# Patient Record
Sex: Female | Born: 1980 | Race: White | Hispanic: No | Marital: Single | Smoking: Never smoker
Health system: Southern US, Community
[De-identification: ages and names within clinical notes are randomized; demographics above are authoritative.]

## PROBLEM LIST (undated history)

## (undated) ENCOUNTER — Inpatient Hospital Stay (HOSPITAL_COMMUNITY): Payer: Self-pay

## (undated) DIAGNOSIS — F329 Major depressive disorder, single episode, unspecified: Secondary | ICD-10-CM

## (undated) DIAGNOSIS — Z789 Other specified health status: Secondary | ICD-10-CM

## (undated) DIAGNOSIS — D696 Thrombocytopenia, unspecified: Secondary | ICD-10-CM

## (undated) DIAGNOSIS — R161 Splenomegaly, not elsewhere classified: Principal | ICD-10-CM

## (undated) DIAGNOSIS — O0990 Supervision of high risk pregnancy, unspecified, unspecified trimester: Secondary | ICD-10-CM

## (undated) DIAGNOSIS — Z3490 Encounter for supervision of normal pregnancy, unspecified, unspecified trimester: Secondary | ICD-10-CM

## (undated) DIAGNOSIS — F32A Depression, unspecified: Secondary | ICD-10-CM

## (undated) HISTORY — DX: Supervision of high risk pregnancy, unspecified, unspecified trimester: O09.90

## (undated) HISTORY — PX: WISDOM TOOTH EXTRACTION: SHX21

## (undated) HISTORY — PX: NO PAST SURGERIES: SHX2092

## (undated) HISTORY — DX: Splenomegaly, not elsewhere classified: R16.1

## (undated) NOTE — *Deleted (*Deleted)
[facility] [phone]   [phone]  PROGRESS NOTE  Patient Care Team: [name], MD as PCP - General (Internal Medicine) Jaci Standard, MD as Consulting Physician (Hematology and Oncology)  Hematological/Oncological History # Chronic Idiopathic Thrombocytopenic Purpura 1) 03/02/2013: delivered healthy child. Plt nadir of 54 2) 03/12/2013: Plt rebound to 165 without intervention.  3) 06/12/2013: last visit with Dr. Cyndie Chime. Platelets normalized to 169,000 4) 05/16/2019: WBC 5.9, Hgb 13.3, Plt 128. MCV 93.8 07/03/2019: Establish care with Dr. Leonides Schanz. WBC 6.5, Hgb 12.1, MCV 95.7, Plt 128. 5) 10/07/2019: WBC 6.6, Hgb 12.6, MCV 95.9, Plt 120 6) 10/23/2019: WBC 5.6, Hgb 10.5, Plt 86, MCV 95.8  Interval History:  Shelly Andrews 57 y.o. female with medical history significant for chronic ITP who presents for a follow up visit. The patient's last visit was on 10/23/2019 at which time she established care. In the interim since the last visit ***  On exam today Shelly Andrews ***  MEDICAL HISTORY:  Past Medical History:  Diagnosis Date  . Depression   . Splenomegaly 04/16/2013  . Thrombocytopenia (HCC)    Idopathic with pregnancy    SURGICAL HISTORY: Past Surgical History:  Procedure Laterality Date  . IUD REMOVAL  10/30/2018   Procedure: Intrauterine Device (Iud) Removal;  Surgeon: [name], MD;  Location: [facility] OR;  Service: Gynecology;;  . LAPAROSCOPIC UNILATERAL SALPINGO OOPHERECTOMY Right 10/30/2018   Procedure: LAPAROSCOPIC RIGHT SALPINGECTOMY AND REMOVAL OF ECTOPIC PREGNANCY;  Surgeon: [name], MD;  Location: [facility] OR;  Service: Gynecology;  Laterality: Right;  . WISDOM TOOTH EXTRACTION      SOCIAL HISTORY: Social History   Socioeconomic History  . Marital status: Single    Spouse name: Not on file  . Number of children: 4  . Years of education: Not on file  . Highest education level: Not on file  Occupational History  .  Not on file  Tobacco Use  . Smoking status: Never Smoker  . Smokeless tobacco: Never Used  Vaping Use  . Vaping Use: Never used  Substance and Sexual Activity  . Alcohol use: Not Currently    Comment: 1-2 on weekend  . Drug use: No  . Sexual activity: Yes    Birth control/protection: None  Other Topics Concern  . Not on file  Social History Narrative   6 pregnancies with loss of 2 (one female fetus loss in 2013 at 31 weeks and one loss in May 2020)   Social Determinants of Health   Financial Resource Strain:   . Difficulty of Paying Living Expenses: Not on file  Food Insecurity: No Food Insecurity  . Worried About Programme researcher, broadcasting/film/video in the Last Year: Never true  . Ran Out of Food in the Last Year: Never true  Transportation Needs: No Transportation Needs  . Lack of Transportation (Medical): No  . Lack of Transportation (Non-Medical): No  Physical Activity:   . Days of Exercise per Week: Not on file  . Minutes of Exercise per Session: Not on file  Stress: No Stress Concern Present  . Feeling of Stress : Not at all  Social Connections:   . Frequency of Communication with Friends and Family: Not on file  . Frequency of Social Gatherings with Friends and Family: Not on file  . Attends Religious Services: Not on file  . Active Member of Clubs or Organizations: Not on file  . Attends Banker Meetings: Not on file  . Marital Status:  Not on file  Intimate Partner Violence:   . Fear of Current or Ex-Partner: Not on file  . Emotionally Abused: Not on file  . Physically Abused: Not on file  . Sexually Abused: Not on file    FAMILY HISTORY: Family History  Problem Relation Age of Onset  . Diabetes Mother   . Diabetes Maternal Grandmother     ALLERGIES:  is allergic to zoloft [sertraline hcl] and sulfa antibiotics.  MEDICATIONS:  Current Outpatient Medications  Medication Sig Dispense Refill  . acetaminophen (TYLENOL) 500 MG tablet Take 1,000 mg by mouth every  6 (six) hours as needed for mild pain or headache.     . Docosahexaenoic Acid (PRENATAL DHA) 200 MG CAPS Take 1 capsule by mouth daily.     Marland Kitchen ibuprofen (ADVIL) 600 MG tablet Take 1 tablet (600 mg total) by mouth every 6 (six) hours. 30 tablet 0  . pantoprazole (PROTONIX) 40 MG tablet Take 1 tablet (40 mg total) by mouth daily. 30 tablet 0  . triamcinolone cream (KENALOG) 0.1 % Apply 1 application topically 2 (two) times daily. 30 g 0   No current facility-administered medications for this visit.    REVIEW OF SYSTEMS:   Constitutional: ( - ) fevers, ( - )  chills , ( - ) night sweats Eyes: ( - ) blurriness of vision, ( - ) double vision, ( - ) watery eyes Ears, nose, mouth, throat, and face: ( - ) mucositis, ( - ) sore throat Respiratory: ( - ) cough, ( - ) dyspnea, ( - ) wheezes Cardiovascular: ( - ) palpitation, ( - ) chest discomfort, ( - ) lower extremity swelling Gastrointestinal:  ( - ) nausea, ( - ) heartburn, ( - ) change in bowel habits Skin: ( - ) abnormal skin rashes Lymphatics: ( - ) new lymphadenopathy, ( - ) easy bruising Neurological: ( - ) numbness, ( - ) tingling, ( - ) new weaknesses Behavioral/Psych: ( - ) mood change, ( - ) new changes  All other systems were reviewed with the patient and are negative.  PHYSICAL EXAMINATION: There were no vitals filed for this visit. There were no vitals filed for this visit.  GENERAL: well appearing middle aged Caucasian female in NAD  SKIN: skin color, texture, turgor are normal, no rashes or significant lesions EYES: conjunctiva are pink and non-injected, sclera clear LUNGS: clear to auscultation and percussion with normal breathing effort HEART: regular rate & rhythm and no murmurs and no lower extremity edema ABDOMEN: gravid uterus Musculoskeletal: no cyanosis of digits and no clubbing  PSYCH: alert & oriented x 3, fluent speech NEURO: no focal motor/sensory deficits  LABORATORY DATA:  I have reviewed the data as listed  CBC Latest Ref Rng & Units 12/21/2019 12/20/2019 12/17/2019  WBC 4.0 - 10.5 K/uL 9.4 8.1 7.0  Hemoglobin 12.0 - 15.0 g/dL 8.4(O) 96.2 11.2(L)  Hematocrit 36 - 46 % 29.9(L) 37.8 34.1(L)  Platelets 150 - 400 K/uL 84(L) 89(L) 80(L)    CMP Latest Ref Rng & Units 10/23/2019 10/07/2019 07/03/2019  Glucose 70 - 99 mg/dL 952(W) 78 79  BUN 6 - 20 mg/dL 5(L) 6 10  Creatinine 4.13 - 1.00 mg/dL 2.44 0.10 2.72  Sodium 135 - 145 mmol/L 139 136 140  Potassium 3.5 - 5.1 mmol/L 3.2(L) 3.6 3.9  Chloride 98 - 111 mmol/L 108 104 107  CO2 22 - 32 mmol/L 21(L) 20(L) 25  Calcium 8.9 - 10.3 mg/dL 8.1(L) 8.7(L) 8.6(L)  Total Protein  6.5 - 8.1 g/dL 0.9(W) 6.0(L) 6.6  Total Bilirubin 0.3 - 1.2 mg/dL 0.5 1.2 0.3  Alkaline Phos 38 - 126 U/L 90 86 52  AST 15 - 41 U/L 16 20 14(L)  ALT 0 - 44 U/L 12 18 15    RADIOGRAPHIC STUDIES: No results found.  ASSESSMENT & PLAN Shelly Andrews 29 y.o. female with medical history significant for chronic ITP who presents for a follow up visit.   After review of the prior records, discussion with the patient, and review the labs the patient's findings are most consistent with a chronic idiopathic thrombocytopenia that developed only during times of pregnancy.  She last had a successful pregnancy in 2014 at which time she was being followed by Dr. Cyndie Chime.  At that time with no intervention her platelet counts dropped to 54,000 and she had a uneventful delivery with a rapid rebound just a few weeks later to 165,000.  Of note the patient did have a poor experience with prednisone therapy before and believes that the late term demise of her pregnancy in 2013 was secondary to steroid use.  Therefore she has requested that we avoid steroid therapy if at all possible.  I previously discussed with her that we would preferentially avoid steroid therapy until absolutely necessary or just before the delivery.  She was open to the idea of IVIG therapy.  She notes that she has not had an  epidural before and she does not wish to have one.  Therefore platelet target goal would be 50 K just before delivery.  Our treatment threshold would be 30 K at any point in her pregnancy.  On exam today Shelly Andrews is having some hypotension as well as drop in her platelet count.  Her hemoglobin has also dropped down to 10.5.  Fortunately her platelet count remains over 50,000 and therefore is still levels that would be safe for delivery.  At this time there is no intervention required for her platelet count.  I will continue every monthly lab checks until her planned delivery date of 11/04/2019.  Plan to see her approximately 3 months after delivery which would be in November 2021.  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 During her last pregnancy in 2014 she was followed by Dr. Cyndie Chime. Counts dropped to 54k with rebound to 165k after delivery without intervention. --patient is currently [redacted] weeks pregnant  --today will order CBC, CMP, and peripheral blood film --extensive prior workup performed for thrombocytopenia here at the [facility]. Bone marrow biopsy not performed due to the clear inciting factor for the findings --we will continue to measure her platelet counts through the duration of her pregnancy. No intervention required for Plt <30k. Prior to deliver the goal for the patient's platelets should be >50k. If epidural is to be performed the goal should be increased to >70k. The patient notes she has no required an epidural before and does not intend to have one at this time. --platelet checks q monthly for the next 2 months with f/u visit to be scheduled 3 months after the delivery.  --RTC in 5 months time for continued evaluation with interval q 1 month lab checks prior to delivery.    #Splenomegaly, chronic --noted to be 15 cm on CT scan in 2014 --imaging showed enlarged spleen at 13  cm on 01/19/2012 and 12.5 cm on 10/11/2012. --not known to have liver disease --continue to monitor. No indication  for splenectomy at this time, though with worsening ITP it could be considered.   #Hypotension --patient notes that she felt lightheaded, previously received IV fluids during this pregnancy --received 1000 ml of NS over 2 hours in clinic with some 20 meq of IV potassium added --orthostatic BP improved at time of release from clinic --continued f/u with Ob/Gyn.    No orders of the defined types were placed in this encounter.   All questions were answered. The patient knows to call the clinic with any problems, questions or concerns.  A total of more than 30 minutes were spent on this encounter and over half of that time was spent on counseling and coordination of care as outlined above.   Ulysees Barns, MD Department of Hematology/Oncology [facility] Phone: [phone] Pager: [phone] Email: [email or web address]  03/22/2020 6:07 PM

---
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
## 2003-07-21 ENCOUNTER — Other Ambulatory Visit: Admission: RE | Admit: 2003-07-21 | Discharge: 2003-07-21 | Payer: Self-pay | Admitting: Obstetrics and Gynecology

## 2004-06-09 ENCOUNTER — Other Ambulatory Visit: Admission: RE | Admit: 2004-06-09 | Discharge: 2004-06-09 | Payer: Self-pay | Admitting: Obstetrics and Gynecology

## 2004-11-23 ENCOUNTER — Emergency Department (HOSPITAL_COMMUNITY): Admission: EM | Admit: 2004-11-23 | Discharge: 2004-11-23 | Payer: Self-pay | Admitting: Emergency Medicine

## 2005-08-02 ENCOUNTER — Inpatient Hospital Stay (HOSPITAL_COMMUNITY): Admission: AD | Admit: 2005-08-02 | Discharge: 2005-08-03 | Payer: Self-pay | Admitting: Obstetrics and Gynecology

## 2006-02-13 ENCOUNTER — Emergency Department (HOSPITAL_COMMUNITY): Admission: EM | Admit: 2006-02-13 | Discharge: 2006-02-13 | Payer: Self-pay | Admitting: Family Medicine

## 2008-04-10 ENCOUNTER — Emergency Department (HOSPITAL_COMMUNITY): Admission: EM | Admit: 2008-04-10 | Discharge: 2008-04-10 | Payer: Self-pay | Admitting: Family Medicine

## 2008-10-19 ENCOUNTER — Inpatient Hospital Stay (HOSPITAL_COMMUNITY): Admission: AD | Admit: 2008-10-19 | Discharge: 2008-10-19 | Payer: Self-pay | Admitting: Obstetrics & Gynecology

## 2008-10-30 ENCOUNTER — Inpatient Hospital Stay (HOSPITAL_COMMUNITY): Admission: AD | Admit: 2008-10-30 | Discharge: 2008-11-02 | Payer: Self-pay | Admitting: Obstetrics & Gynecology

## 2009-09-15 ENCOUNTER — Ambulatory Visit: Payer: Self-pay | Admitting: Hematology and Oncology

## 2009-09-17 LAB — COMPREHENSIVE METABOLIC PANEL
CO2: 21 mEq/L (ref 19–32)
Creatinine, Ser: 0.61 mg/dL (ref 0.40–1.20)
Glucose, Bld: 87 mg/dL (ref 70–99)
Total Bilirubin: 0.3 mg/dL (ref 0.3–1.2)

## 2009-09-17 LAB — CBC WITH DIFFERENTIAL/PLATELET
EOS%: 1.6 % (ref 0.0–7.0)
Eosinophils Absolute: 0.1 10*3/uL (ref 0.0–0.5)
MCV: 94 fL (ref 79.5–101.0)
MONO%: 5.3 % (ref 0.0–14.0)
NEUT#: 5 10*3/uL (ref 1.5–6.5)
RBC: 3.76 10*6/uL (ref 3.70–5.45)
RDW: 13.2 % (ref 11.2–14.5)
WBC: 6.8 10*3/uL (ref 3.9–10.3)
lymph#: 1.3 10*3/uL (ref 0.9–3.3)

## 2009-09-17 LAB — MORPHOLOGY

## 2009-09-24 LAB — CBC WITH DIFFERENTIAL/PLATELET
BASO%: 0.1 % (ref 0.0–2.0)
EOS%: 0.3 % (ref 0.0–7.0)
HCT: 37 % (ref 34.8–46.6)
HGB: 12.2 g/dL (ref 11.6–15.9)
LYMPH%: 11 % — ABNORMAL LOW (ref 14.0–49.7)
MCH: 31.3 pg (ref 25.1–34.0)
MCHC: 33 g/dL (ref 31.5–36.0)
NEUT#: 7.9 10*3/uL — ABNORMAL HIGH (ref 1.5–6.5)
NEUT%: 84.7 % — ABNORMAL HIGH (ref 38.4–76.8)
RBC: 3.9 10*6/uL (ref 3.70–5.45)
lymph#: 1 10*3/uL (ref 0.9–3.3)
nRBC: 0 % (ref 0–0)

## 2009-09-28 ENCOUNTER — Encounter: Admission: RE | Admit: 2009-09-28 | Discharge: 2009-09-28 | Payer: Self-pay | Admitting: Obstetrics and Gynecology

## 2009-09-30 LAB — CBC WITH DIFFERENTIAL/PLATELET
BASO%: 0.2 % (ref 0.0–2.0)
EOS%: 0.1 % (ref 0.0–7.0)
LYMPH%: 12.6 % — ABNORMAL LOW (ref 14.0–49.7)
MCHC: 34.3 g/dL (ref 31.5–36.0)
MCV: 94.1 fL (ref 79.5–101.0)
MONO%: 4.4 % (ref 0.0–14.0)
Platelets: 89 10*3/uL — ABNORMAL LOW (ref 145–400)
RBC: 3.89 10*6/uL (ref 3.70–5.45)
RDW: 13.1 % (ref 11.2–14.5)

## 2009-10-07 LAB — CBC WITH DIFFERENTIAL/PLATELET
BASO%: 0.1 % (ref 0.0–2.0)
Eosinophils Absolute: 0 10*3/uL (ref 0.0–0.5)
HCT: 40.2 % (ref 34.8–46.6)
HGB: 13.8 g/dL (ref 11.6–15.9)
LYMPH%: 8.7 % — ABNORMAL LOW (ref 14.0–49.7)
MCHC: 34.3 g/dL (ref 31.5–36.0)
MONO#: 0.5 10*3/uL (ref 0.1–0.9)
NEUT#: 13.2 10*3/uL — ABNORMAL HIGH (ref 1.5–6.5)
NEUT%: 87.9 % — ABNORMAL HIGH (ref 38.4–76.8)
Platelets: 84 10*3/uL — ABNORMAL LOW (ref 145–400)
WBC: 15 10*3/uL — ABNORMAL HIGH (ref 3.9–10.3)
lymph#: 1.3 10*3/uL (ref 0.9–3.3)

## 2009-10-14 LAB — CBC WITH DIFFERENTIAL/PLATELET
Basophils Absolute: 0 10*3/uL (ref 0.0–0.1)
HCT: 39.6 % (ref 34.8–46.6)
HGB: 13.6 g/dL (ref 11.6–15.9)
LYMPH%: 20.8 % (ref 14.0–49.7)
MCH: 32.3 pg (ref 25.1–34.0)
MCHC: 34.5 g/dL (ref 31.5–36.0)
MONO#: 0.6 10*3/uL (ref 0.1–0.9)
NEUT%: 73.6 % (ref 38.4–76.8)
Platelets: 65 10*3/uL — ABNORMAL LOW (ref 145–400)
WBC: 10.9 10*3/uL — ABNORMAL HIGH (ref 3.9–10.3)
lymph#: 2.3 10*3/uL (ref 0.9–3.3)

## 2009-10-15 ENCOUNTER — Ambulatory Visit: Payer: Self-pay | Admitting: Hematology and Oncology

## 2009-10-19 ENCOUNTER — Ambulatory Visit (HOSPITAL_COMMUNITY): Admission: AD | Admit: 2009-10-19 | Discharge: 2009-10-19 | Payer: Self-pay | Admitting: Obstetrics and Gynecology

## 2009-10-20 ENCOUNTER — Ambulatory Visit (HOSPITAL_COMMUNITY): Admission: AD | Admit: 2009-10-20 | Discharge: 2009-10-20 | Payer: Self-pay | Admitting: Obstetrics and Gynecology

## 2009-10-28 LAB — CBC WITH DIFFERENTIAL/PLATELET
BASO%: 0 % (ref 0.0–2.0)
Basophils Absolute: 0 10*3/uL (ref 0.0–0.1)
EOS%: 0 % (ref 0.0–7.0)
HCT: 41.6 % (ref 34.8–46.6)
HGB: 14.4 g/dL (ref 11.6–15.9)
LYMPH%: 9.9 % — ABNORMAL LOW (ref 14.0–49.7)
MCH: 32.5 pg (ref 25.1–34.0)
MCHC: 34.5 g/dL (ref 31.5–36.0)
MCV: 94 fL (ref 79.5–101.0)
MONO%: 2.5 % (ref 0.0–14.0)
NEUT%: 87.6 % — ABNORMAL HIGH (ref 38.4–76.8)

## 2009-11-03 LAB — CBC WITH DIFFERENTIAL/PLATELET
BASO%: 0.2 % (ref 0.0–2.0)
Basophils Absolute: 0 10*3/uL (ref 0.0–0.1)
EOS%: 0 % (ref 0.0–7.0)
MCH: 32 pg (ref 25.1–34.0)
MCHC: 34.1 g/dL (ref 31.5–36.0)
MCV: 93.7 fL (ref 79.5–101.0)
MONO%: 3.9 % (ref 0.0–14.0)
RBC: 4.53 10*6/uL (ref 3.70–5.45)
RDW: 14.4 % (ref 11.2–14.5)

## 2009-11-08 ENCOUNTER — Inpatient Hospital Stay (HOSPITAL_COMMUNITY): Admission: AD | Admit: 2009-11-08 | Discharge: 2009-11-10 | Payer: Self-pay | Admitting: Obstetrics and Gynecology

## 2009-11-09 ENCOUNTER — Ambulatory Visit: Payer: Self-pay | Admitting: Hematology and Oncology

## 2009-11-15 ENCOUNTER — Ambulatory Visit: Payer: Self-pay | Admitting: Hematology and Oncology

## 2009-11-17 LAB — CBC WITH DIFFERENTIAL/PLATELET
BASO%: 0.2 % (ref 0.0–2.0)
EOS%: 0.2 % (ref 0.0–7.0)
Eosinophils Absolute: 0 10*3/uL (ref 0.0–0.5)
LYMPH%: 15.2 % (ref 14.0–49.7)
MCH: 32.1 pg (ref 25.1–34.0)
MCHC: 33.9 g/dL (ref 31.5–36.0)
MCV: 94.6 fL (ref 79.5–101.0)
MONO%: 6.2 % (ref 0.0–14.0)
NEUT#: 6.3 10*3/uL (ref 1.5–6.5)
Platelets: 119 10*3/uL — ABNORMAL LOW (ref 145–400)
RBC: 4.67 10*6/uL (ref 3.70–5.45)
RDW: 14.9 % — ABNORMAL HIGH (ref 11.2–14.5)

## 2009-11-17 LAB — BASIC METABOLIC PANEL
Calcium: 9.1 mg/dL (ref 8.4–10.5)
Glucose, Bld: 96 mg/dL (ref 70–99)
Potassium: 4 mEq/L (ref 3.5–5.3)
Sodium: 141 mEq/L (ref 135–145)

## 2009-12-14 LAB — BASIC METABOLIC PANEL
BUN: 9 mg/dL (ref 6–23)
Chloride: 104 mEq/L (ref 96–112)
Creatinine, Ser: 0.84 mg/dL (ref 0.40–1.20)
Glucose, Bld: 72 mg/dL (ref 70–99)
Potassium: 4 mEq/L (ref 3.5–5.3)

## 2009-12-14 LAB — CBC WITH DIFFERENTIAL/PLATELET
Basophils Absolute: 0 10*3/uL (ref 0.0–0.1)
EOS%: 0.5 % (ref 0.0–7.0)
Eosinophils Absolute: 0 10*3/uL (ref 0.0–0.5)
HCT: 42.3 % (ref 34.8–46.6)
HGB: 14.3 g/dL (ref 11.6–15.9)
MCH: 32 pg (ref 25.1–34.0)
MCV: 94.5 fL (ref 79.5–101.0)
NEUT#: 7.2 10*3/uL — ABNORMAL HIGH (ref 1.5–6.5)
NEUT%: 80.1 % — ABNORMAL HIGH (ref 38.4–76.8)
lymph#: 1.4 10*3/uL (ref 0.9–3.3)

## 2010-02-09 ENCOUNTER — Encounter: Admission: RE | Admit: 2010-02-09 | Discharge: 2010-03-08 | Payer: Self-pay | Admitting: Podiatry

## 2010-07-24 LAB — GLUCOSE, CAPILLARY
Glucose-Capillary: 107 mg/dL — ABNORMAL HIGH (ref 70–99)
Glucose-Capillary: 143 mg/dL — ABNORMAL HIGH (ref 70–99)
Glucose-Capillary: 204 mg/dL — ABNORMAL HIGH (ref 70–99)

## 2010-07-24 LAB — CBC
MCH: 32.9 pg (ref 26.0–34.0)
MCH: 33.1 pg (ref 26.0–34.0)
MCHC: 34.6 g/dL (ref 30.0–36.0)
MCV: 95 fL (ref 78.0–100.0)
MCV: 96 fL (ref 78.0–100.0)
Platelets: 69 10*3/uL — ABNORMAL LOW (ref 150–400)
Platelets: 69 10*3/uL — ABNORMAL LOW (ref 150–400)
Platelets: 87 10*3/uL — ABNORMAL LOW (ref 150–400)
RBC: 4.1 MIL/uL (ref 3.87–5.11)
RDW: 14.3 % (ref 11.5–15.5)
RDW: 14.3 % (ref 11.5–15.5)
WBC: 13.1 10*3/uL — ABNORMAL HIGH (ref 4.0–10.5)
WBC: 17.6 10*3/uL — ABNORMAL HIGH (ref 4.0–10.5)

## 2010-07-24 LAB — SAMPLE TO BLOOD BANK

## 2010-07-24 LAB — RH IMMUNE GLOB WKUP(>/=20WKS)(NOT WOMEN'S HOSP)

## 2010-07-24 LAB — RPR: RPR Ser Ql: NONREACTIVE

## 2010-08-15 LAB — COMPREHENSIVE METABOLIC PANEL
AST: 24 U/L (ref 0–37)
Albumin: 2.4 g/dL — ABNORMAL LOW (ref 3.5–5.2)
Alkaline Phosphatase: 193 U/L — ABNORMAL HIGH (ref 39–117)
Chloride: 109 mEq/L (ref 96–112)
Creatinine, Ser: 0.71 mg/dL (ref 0.4–1.2)
GFR calc Af Amer: >60 mL/min (ref >60)
Potassium: 3.5 mEq/L (ref 3.5–5.1)
Total Bilirubin: 1 mg/dL (ref 0.3–1.2)

## 2010-08-15 LAB — CBC
HCT: 34.8 % — ABNORMAL LOW (ref 36.0–46.0)
MCHC: 33.4 g/dL (ref 30.0–36.0)
MCHC: 34.2 g/dL (ref 30.0–36.0)
MCHC: 34.3 g/dL (ref 30.0–36.0)
MCHC: 34.7 g/dL (ref 30.0–36.0)
MCV: 93.3 fL (ref 78.0–100.0)
MCV: 94.6 fL (ref 78.0–100.0)
MCV: 95.2 fL (ref 78.0–100.0)
Platelets: 42 10*3/uL — CL (ref 150–400)
Platelets: 45 10*3/uL — CL (ref 150–400)
Platelets: 54 10*3/uL — ABNORMAL LOW (ref 150–400)
Platelets: 59 10*3/uL — ABNORMAL LOW (ref 150–400)
Platelets: 61 10*3/uL — ABNORMAL LOW (ref 150–400)
RBC: 3.68 MIL/uL — ABNORMAL LOW (ref 3.87–5.11)
RBC: 4.12 MIL/uL (ref 3.87–5.11)
RDW: 14.3 % (ref 11.5–15.5)
RDW: 14.5 % (ref 11.5–15.5)
RDW: 14.5 % (ref 11.5–15.5)
WBC: 12.2 10*3/uL — ABNORMAL HIGH (ref 4.0–10.5)
WBC: 9 10*3/uL (ref 4.0–10.5)

## 2010-08-15 LAB — RH IMMUNE GLOB WKUP(>/=20WKS)(NOT WOMEN'S HOSP): Fetal Screen: NEGATIVE

## 2010-08-15 LAB — DIFFERENTIAL
Basophils Absolute: 0 10*3/uL (ref 0.0–0.1)
Lymphocytes Relative: 20 % (ref 12–46)
Neutro Abs: 7.3 10*3/uL (ref 1.7–7.7)
Neutrophils Relative %: 73 % (ref 43–77)

## 2010-08-15 LAB — TYPE AND SCREEN
ABO/RH(D): O NEG
Antibody Screen: NEGATIVE

## 2010-08-15 LAB — URIC ACID: Uric Acid, Serum: 5.9 mg/dL (ref 2.4–7.0)

## 2010-08-15 LAB — PROTIME-INR: Prothrombin Time: 14.2 seconds (ref 11.6–15.2)

## 2010-08-15 LAB — RPR: RPR Ser Ql: NONREACTIVE

## 2011-01-28 ENCOUNTER — Inpatient Hospital Stay (INDEPENDENT_AMBULATORY_CARE_PROVIDER_SITE_OTHER)
Admission: RE | Admit: 2011-01-28 | Discharge: 2011-01-28 | Disposition: A | Discharge status: Home / Self Care | Payer: Medicaid Other | Source: Ambulatory Visit | Attending: Family Medicine | Admitting: Family Medicine

## 2011-01-28 DIAGNOSIS — N390 Urinary tract infection, site not specified: Secondary | ICD-10-CM

## 2011-01-28 LAB — POCT URINALYSIS DIP (DEVICE)
Bilirubin Urine: NEGATIVE
Glucose, UA: 100 mg/dL — AB
Nitrite: POSITIVE — AB
Specific Gravity, Urine: >=1.03 (ref 1.005–1.030)
Urobilinogen, UA: 2 mg/dL — ABNORMAL HIGH (ref 0.0–1.0)

## 2011-01-28 LAB — POCT PREGNANCY, URINE: Preg Test, Ur: NEGATIVE

## 2011-06-24 ENCOUNTER — Inpatient Hospital Stay (HOSPITAL_COMMUNITY): Payer: Medicaid Other

## 2011-06-24 ENCOUNTER — Inpatient Hospital Stay (HOSPITAL_COMMUNITY)
Admission: AD | Admit: 2011-06-24 | Discharge: 2011-06-24 | Disposition: A | Discharge status: Home / Self Care | Payer: Medicaid Other | Source: Ambulatory Visit | Attending: Obstetrics & Gynecology | Admitting: Obstetrics & Gynecology

## 2011-06-24 ENCOUNTER — Encounter (HOSPITAL_COMMUNITY): Payer: Self-pay | Admitting: *Deleted

## 2011-06-24 DIAGNOSIS — R12 Heartburn: Secondary | ICD-10-CM | POA: Insufficient documentation

## 2011-06-24 DIAGNOSIS — O2390 Unspecified genitourinary tract infection in pregnancy, unspecified trimester: Secondary | ICD-10-CM | POA: Insufficient documentation

## 2011-06-24 DIAGNOSIS — Z3201 Encounter for pregnancy test, result positive: Secondary | ICD-10-CM

## 2011-06-24 DIAGNOSIS — R109 Unspecified abdominal pain: Secondary | ICD-10-CM | POA: Insufficient documentation

## 2011-06-24 DIAGNOSIS — N760 Acute vaginitis: Secondary | ICD-10-CM | POA: Insufficient documentation

## 2011-06-24 DIAGNOSIS — B9689 Other specified bacterial agents as the cause of diseases classified elsewhere: Secondary | ICD-10-CM | POA: Insufficient documentation

## 2011-06-24 DIAGNOSIS — A499 Bacterial infection, unspecified: Secondary | ICD-10-CM | POA: Insufficient documentation

## 2011-06-24 DIAGNOSIS — O26899 Other specified pregnancy related conditions, unspecified trimester: Secondary | ICD-10-CM

## 2011-06-24 HISTORY — DX: Thrombocytopenia, unspecified: D69.6

## 2011-06-24 LAB — URINALYSIS, ROUTINE W REFLEX MICROSCOPIC
Ketones, ur: NEGATIVE mg/dL
Leukocytes, UA: NEGATIVE
Nitrite: NEGATIVE
Protein, ur: NEGATIVE mg/dL

## 2011-06-24 LAB — CBC
HCT: 38.5 % (ref 36.0–46.0)
Hemoglobin: 12.7 g/dL (ref 12.0–15.0)
RBC: 4.2 MIL/uL (ref 3.87–5.11)
RDW: 13.2 % (ref 11.5–15.5)
WBC: 5.5 10*3/uL (ref 4.0–10.5)

## 2011-06-24 LAB — WET PREP, GENITAL

## 2011-06-24 MED ORDER — METRONIDAZOLE 500 MG PO TABS: 500.0000 mg | ORAL_TABLET | Freq: Two times a day (BID) | ORAL | Status: AC

## 2011-06-24 MED ORDER — RANITIDINE HCL 150 MG PO TABS: 150.0000 mg | ORAL_TABLET | Freq: Two times a day (BID) | ORAL | Status: DC

## 2011-06-24 NOTE — Discharge Instructions (Signed)
No smoking, no drugs, no alcohol.  Take a prenatal vitamin one by mouth every day.  Eat small frequent snacks to avoid nausea.  Begin prenatal care as soon as possible. Return if you develop more severe abdominal pain or vaginal bleeding. Drink at least 8 8-oz glasses of water every day. Take Tylenol 325 mg 2 tablets by mouth every 4 hours if needed for pain.

## 2011-06-24 NOTE — Progress Notes (Signed)
Pt reports having abd pain on and off for a few days c/o heart burn. Had positive HPT

## 2011-06-24 NOTE — ED Provider Notes (Signed)
History     Chief Complaint  Patient presents with  . Abdominal Pain   HPI Shelly Andrews 31 y.o. 4w 5d gestation by LMP 05-27-11.  Is having lower abdominal cramping and heartburn.  Suspected she was pregnant but is very tearful with positive pregnancy result.    OB History    Grav Para Term Preterm Abortions TAB SAB Ect Mult Living   3 2 1 1  0 0 0 0 0 2      Past Medical History  Diagnosis Date  . Thrombocytopenia     Idopathic with pregnancy    Past Surgical History  Procedure Date  . No past surgeries     No family history on file.  History  Substance Use Topics  . Smoking status: Never Smoker   . Smokeless tobacco: Not on file  . Alcohol Use: Yes     1-2 on weekend    Allergies:  Allergies  Allergen Reactions  . Sulfa Antibiotics Hives    Prescriptions prior to admission  Medication Sig Dispense Refill  . acetaminophen (TYLENOL) 325 MG tablet Take 650 mg by mouth every 6 (six) hours as needed. For pain        Review of Systems  Constitutional: Negative for fever.  Gastrointestinal: Positive for heartburn and abdominal pain. Negative for nausea and vomiting.  Genitourinary:       No vaginal bleeding   Physical Exam   Blood pressure 107/73, pulse 102, temperature 98.9 F (37.2 C), temperature source Oral, resp. rate 18, height 5\' 3"  (1.6 m), weight 126 lb 9.6 oz (57.425 kg), last menstrual period 05/22/2011.  Physical Exam  Nursing note and vitals reviewed. Constitutional: She is oriented to person, place, and time. She appears well-developed and well-nourished.  HENT:  Head: Normocephalic.  Eyes: EOM are normal.  Neck: Neck supple.  GI: Soft. There is no tenderness.  Genitourinary:       Speculum exam: Vagina - large amount of yellow liquid discharge, no odor Cervix - cervix barely open and white substance noted in cervix.  Contact bleeding noted Bimanual exam: Cervix closed Uterus non tender, normal size Adnexa non tender, no masses  bilaterally GC/Chlam, wet prep done Chaperone present for exam.  Musculoskeletal: Normal range of motion.  Neurological: She is alert and oriented to person, place, and time.  Skin: Skin is warm and dry.  Psychiatric: She has a normal mood and affect.    MAU Course  Procedures  MDM Ultrasound - 5w 2d IUGS with probable yolk sac.  No fetal pole.  No adnexal masses.  No free fluid.  Results for orders placed during the hospital encounter of 06/24/11 (from the past 24 hour(s))  URINALYSIS, ROUTINE W REFLEX MICROSCOPIC     Status: Normal   Collection Time   06/24/11  1:40 PM      Component Value Range   Color, Urine YELLOW  YELLOW    APPearance CLEAR  CLEAR    Specific Gravity, Urine 1.020  1.005 - 1.030    pH 7.0  5.0 - 8.0    Glucose, UA NEGATIVE  NEGATIVE (mg/dL)   Hgb urine dipstick NEGATIVE  NEGATIVE    Bilirubin Urine NEGATIVE  NEGATIVE    Ketones, ur NEGATIVE  NEGATIVE (mg/dL)   Protein, ur NEGATIVE  NEGATIVE (mg/dL)   Urobilinogen, UA 0.2  0.0 - 1.0 (mg/dL)   Nitrite NEGATIVE  NEGATIVE    Leukocytes, UA NEGATIVE  NEGATIVE   POCT PREGNANCY, URINE  Status: Abnormal   Collection Time   06/24/11  1:47 PM      Component Value Range   Preg Test, Ur POSITIVE (*) NEGATIVE   WET PREP, GENITAL     Status: Abnormal   Collection Time   06/24/11  4:58 PM      Component Value Range   Yeast Wet Prep HPF POC NONE SEEN  NONE SEEN    Trich, Wet Prep NONE SEEN  NONE SEEN    Clue Cells Wet Prep HPF POC MODERATE (*) NONE SEEN    WBC, Wet Prep HPF POC FEW (*) NONE SEEN   CBC     Status: Abnormal   Collection Time   06/24/11  5:03 PM      Component Value Range   WBC 5.5  4.0 - 10.5 (K/uL)   RBC 4.20  3.87 - 5.11 (MIL/uL)   Hemoglobin 12.7  12.0 - 15.0 (g/dL)   HCT 65.7  84.6 - 96.2 (%)   MCV 91.7  78.0 - 100.0 (fL)   MCH 30.2  26.0 - 34.0 (pg)   MCHC 33.0  30.0 - 36.0 (g/dL)   RDW 95.2  84.1 - 32.4 (%)   Platelets 116 (*) 150 - 400 (K/uL)  HCG, QUANTITATIVE, PREGNANCY      Status: Abnormal   Collection Time   06/24/11  5:03 PM      Component Value Range   hCG, Beta Chain, Quant, S 5672 (*) <5 (mIU/mL)   Assessment and Plan  Early pregnancy - IUGS with probable yolk sac Bacterial vaginosis  Plan rx metronidazole 500 mg po bid x 7 days (#14) no refills rx zantac 150 mg PO bid (#60) no refills Labs pending No smoking, no drugs, no alcohol.  Take a prenatal vitamin one by mouth every day.  Eat small frequent snacks to avoid nausea.  Begin prenatal care as soon as possible. Return if you develop more severe abdominal pain or vaginal bleeding. Drink at least 8 8-oz glasses of water every day. Take Tylenol 325 mg 2 tablets by mouth every 4 hours if needed for pain.   Channon Brougher 06/24/2011, 4:46 PM   Nolene Bernheim, NP 06/24/11 1835

## 2011-06-24 NOTE — ED Provider Notes (Signed)
Attestation of Attending Supervision of Advanced Practitioner: Evaluation and management procedures were performed by the PA/NP/CNM/OB Fellow under my supervision/collaboration. Chart reviewed, and agree with management and plan.  Jaynie Collins, M.D. 06/24/2011 7:17 PM

## 2011-07-06 LAB — OB RESULTS CONSOLE HEPATITIS B SURFACE ANTIGEN: Hepatitis B Surface Ag: NEGATIVE

## 2011-07-18 ENCOUNTER — Inpatient Hospital Stay (HOSPITAL_COMMUNITY)
Admission: AD | Admit: 2011-07-18 | Discharge: 2011-07-18 | Disposition: A | Discharge status: Home / Self Care | Payer: Medicaid Other | Source: Ambulatory Visit | Attending: Obstetrics and Gynecology | Admitting: Obstetrics and Gynecology

## 2011-07-18 ENCOUNTER — Encounter (HOSPITAL_COMMUNITY): Payer: Self-pay | Admitting: *Deleted

## 2011-07-18 DIAGNOSIS — O209 Hemorrhage in early pregnancy, unspecified: Secondary | ICD-10-CM | POA: Insufficient documentation

## 2011-07-18 HISTORY — DX: Other specified health status: Z78.9

## 2011-07-18 MED ORDER — RHO D IMMUNE GLOBULIN 1500 UNIT/2ML IJ SOLN
300.0000 ug | Freq: Once | INTRAMUSCULAR | Status: AC
  Administered 2011-07-18: 300 ug via INTRAMUSCULAR
  Filled 2011-07-18: qty 2

## 2011-07-18 NOTE — MAU Note (Signed)
Received rhophyllac  With other pregnancies without any problems. Pt dc'd.

## 2011-07-18 NOTE — MAU Note (Signed)
Pt had intercourse then got up to use restroom, noticed vaginal bleeding when she wiped

## 2011-07-18 NOTE — MAU Note (Signed)
Patient states she had bright red vaginal bleeding on the tissue after intercourse. No pain.

## 2011-07-18 NOTE — Discharge Instructions (Signed)
KEEP YOUR APPOINTMENT TOMORROW WITH DR. Renaldo Fiddler. RETURN HERE AS NEEDED.  Threatened Miscarriage  A threatened miscarriage is a pregnancy that may end. It may be marked by bleeding during the first 20 weeks of pregnancy. Often, the pregnancy can continue without any more problems. You may be asked to stop:  Having sex (intercourse).   Having orgasms.   Using tampons.   Exercising.   Doing heavy physical activity and work.  HOME CARE   Your doctor may tell you to take bed rest and to stop activities and work.   Write down the number of pads you use each day. Write down how often you change pads. Write down how soaked they are.   Follow your doctor's advice for follow-up visits and tests.   If your blood type is Rh-negative and the father's blood is Rh-positive (or is not known), you may get a shot to protect the baby.   If you have a miscarriage, save all the tissue you pass in a container. Take the container to your doctor.  GET HELP RIGHT AWAY IF:   You have bad cramps or pain in your belly (abdomen), lower belly, or back.   You have a fever or chills.   Your bleeding gets worse or you pass large clots of blood or tissue. Save this tissue to show your doctor.   You feel lightheaded, weak, dizzy, or pass out (faint).   You have a gush of fluid from your vagina.  MAKE SURE YOU:   Understand these instructions.   Will watch your condition.   Will get help right away if you are not doing well or get worse.  Document Released: 04/06/2008 Document Revised: 04/13/2011 Document Reviewed: 05/10/2009 [facility] Patient Information 2012 [place], [place].

## 2011-07-18 NOTE — MAU Provider Note (Signed)
History     CSN: [id number]  Arrival date & time 07/18/11  1420   None     Chief Complaint  Patient presents with  . Vaginal Bleeding    HPI Shelly Andrews is a 31 y.o. female @ [redacted]w[redacted]d gestation who presents to MAU for spotting after intercourse. Denies pain. Previous visit here in MAU 2/16 13 showed a 5 week 2 day IUP. Has appointment to start prenatal care with Dr. Renaldo Fiddler tomorrow.  Past Medical History  Diagnosis Date  . Thrombocytopenia     Idopathic with pregnancy  . No pertinent past medical history     Past Surgical History  Procedure Date  . No past surgeries     History reviewed. No pertinent family history.  History  Substance Use Topics  . Smoking status: Never Smoker   . Smokeless tobacco: Not on file  . Alcohol Use: No     1-2 on weekend    OB History    Grav Para Term Preterm Abortions TAB SAB Ect Mult Living   3 2 1 1  0 0 0 0 0 2      Review of Systems  Constitutional: Negative for fever, chills, diaphoresis and fatigue.  HENT: Negative for ear pain, congestion, sore throat, facial swelling, neck pain, neck stiffness, dental problem and sinus pressure.   Eyes: Negative for photophobia, pain and discharge.  Respiratory: Negative for cough, chest tightness and wheezing.   Cardiovascular: Negative.   Gastrointestinal: Positive for nausea. Negative for vomiting, abdominal pain, diarrhea, constipation and abdominal distention.  Genitourinary: Positive for frequency. Negative for dysuria, flank pain, vaginal bleeding, vaginal discharge, difficulty urinating and pelvic pain.  Musculoskeletal: Negative for myalgias, back pain and gait problem.  Skin: Negative for color change and rash.  Neurological: Positive for headaches. Negative for dizziness, speech difficulty, weakness, light-headedness and numbness.  Psychiatric/Behavioral: Negative for confusion and agitation. The patient is not nervous/anxious.    Informal bedside ultrasound shows IUP with  cardiac activity. Patient able to visualize the heart beat.  Allergies  Sulfa antibiotics  Home Medications  No current outpatient prescriptions on file.  BP 114/72  Pulse 93  Temp(Src) 99.3 F (37.4 C) (Oral)  Resp 16  Ht 5\' 3"  (1.6 m)  Wt 127 lb 6.4 oz (57.788 kg)  BMI 22.57 kg/m2  SpO2 100%  LMP 05/22/2011  Physical Exam  Nursing note and vitals reviewed. Constitutional: She is oriented to person, place, and time. She appears well-developed and well-nourished. No distress.  HENT:  Head: Normocephalic.  Eyes: EOM are normal.  Neck: Neck supple.  Cardiovascular: Normal rate.   Pulmonary/Chest: Effort normal.  Abdominal: Soft. There is no tenderness.  Genitourinary:       Small amount of blood vaginal vault. Cervix closed, no CMT, no adnexal tenderness. Uterus approximately 8 week size.  Musculoskeletal: Normal range of motion.  Neurological: She is alert and oriented to person, place, and time. No cranial nerve deficit.  Skin: Skin is warm and dry.  Psychiatric: Her behavior is normal. Judgment and thought content normal. Her mood appears anxious.       Tearful     ED Course: Dr. Marcelle Overlie paged @ 15:30 pm to discuss patient findings. Pt. To keep appointment in office tomorrow. Will give Rhogam today.  Procedures   Assessment: Bleeding in early pregnancy  Plan:  Rhogam   Follow up in the office tomorrow as scheduled   Return here as needed. MDM

## 2011-07-19 LAB — RH IG WORKUP (INCLUDES ABO/RH)
ABO/RH(D): O NEG
Antibody Screen: NEGATIVE
Gestational Age(Wks): 8

## 2011-08-21 ENCOUNTER — Encounter (HOSPITAL_COMMUNITY): Payer: Self-pay | Admitting: *Deleted

## 2011-08-21 ENCOUNTER — Inpatient Hospital Stay (HOSPITAL_COMMUNITY)
Admission: AD | Admit: 2011-08-21 | Discharge: 2011-08-21 | Disposition: A | Discharge status: Hospice | Payer: Medicaid Other | Source: Ambulatory Visit | Attending: Obstetrics and Gynecology | Admitting: Obstetrics and Gynecology

## 2011-08-21 DIAGNOSIS — O2390 Unspecified genitourinary tract infection in pregnancy, unspecified trimester: Secondary | ICD-10-CM | POA: Insufficient documentation

## 2011-08-21 DIAGNOSIS — A499 Bacterial infection, unspecified: Secondary | ICD-10-CM | POA: Insufficient documentation

## 2011-08-21 DIAGNOSIS — N760 Acute vaginitis: Secondary | ICD-10-CM | POA: Insufficient documentation

## 2011-08-21 DIAGNOSIS — B9689 Other specified bacterial agents as the cause of diseases classified elsewhere: Secondary | ICD-10-CM | POA: Insufficient documentation

## 2011-08-21 DIAGNOSIS — O209 Hemorrhage in early pregnancy, unspecified: Secondary | ICD-10-CM

## 2011-08-21 DIAGNOSIS — O26859 Spotting complicating pregnancy, unspecified trimester: Secondary | ICD-10-CM | POA: Insufficient documentation

## 2011-08-21 LAB — URINE MICROSCOPIC-ADD ON

## 2011-08-21 LAB — URINALYSIS, ROUTINE W REFLEX MICROSCOPIC
Glucose, UA: NEGATIVE mg/dL
Ketones, ur: NEGATIVE mg/dL
pH: 6 (ref 5.0–8.0)

## 2011-08-21 LAB — WET PREP, GENITAL: Yeast Wet Prep HPF POC: NONE SEEN

## 2011-08-21 MED ORDER — METRONIDAZOLE 500 MG PO TABS: 500.0000 mg | ORAL_TABLET | Freq: Two times a day (BID) | ORAL | Status: AC

## 2011-08-21 NOTE — MAU Provider Note (Signed)
History     CSN: [id number]  Arrival date and time: 08/21/11 1716   First Provider Initiated Contact with Patient 08/21/11 2008      Chief Complaint  Patient presents with  . Vaginal Bleeding   HPI 31 y.o. [id number] at [redacted]w[redacted]d one episode of spotting with urination/wiping. No pain. Had one prior episode of spotting this pregnancy, evaluated in MAU, normal u/s at that time.    Past Medical History  Diagnosis Date  . Thrombocytopenia     Idopathic with pregnancy  . No pertinent past medical history     Past Surgical History  Procedure Date  . No past surgeries     History reviewed. No pertinent family history.  History  Substance Use Topics  . Smoking status: Never Smoker   . Smokeless tobacco: Not on file  . Alcohol Use: No     1-2 on weekend    Allergies:  Allergies  Allergen Reactions  . Sulfa Antibiotics Hives    Prescriptions prior to admission  Medication Sig Dispense Refill  . acetaminophen (TYLENOL) 325 MG tablet Take 650 mg by mouth every 6 (six) hours as needed. For pain      . Prenatal Vit-Fe Fumarate-FA (PRENATAL MULTIVITAMIN) TABS Take 1 tablet by mouth every morning.       . ranitidine (ZANTAC) 150 MG tablet Take 1 tablet (150 mg total) by mouth 2 (two) times daily.  60 tablet  1    Review of Systems  Constitutional: Negative.   Respiratory: Negative.   Cardiovascular: Negative.   Gastrointestinal: Negative for nausea, vomiting, abdominal pain, diarrhea and constipation.  Genitourinary: Negative for dysuria, urgency, frequency, hematuria and flank pain.       Positive for vaginal bleeding  Musculoskeletal: Negative.   Neurological: Negative.   Psychiatric/Behavioral: Negative.    Physical Exam   Blood pressure 108/64, pulse 85, temperature 98.1 F (36.7 C), temperature source Oral, resp. rate 16, height 5' 2.5" (1.588 m), weight 125 lb 12.8 oz (57.063 kg), last menstrual period 05/22/2011, SpO2 100.00%.  Physical Exam  Constitutional: She  is oriented to person, place, and time. She appears well-developed and well-nourished. No distress.  HENT:  Head: Normocephalic and atraumatic.  Cardiovascular: Normal rate, regular rhythm and normal heart sounds.   Respiratory: Effort normal and breath sounds normal. No respiratory distress.  GI: Soft. Bowel sounds are normal. She exhibits no distension and no mass. There is no tenderness. There is no rebound and no guarding.  Genitourinary: There is no rash or lesion on the right labia. There is no rash or lesion on the left labia. Uterus is not deviated, not enlarged, not fixed and not tender. Cervix exhibits no motion tenderness, no discharge and no friability. Right adnexum displays no mass, no tenderness and no fullness. Left adnexum displays no mass, no tenderness and no fullness. There is bleeding (small) around the vagina. No erythema or tenderness around the vagina. No vaginal discharge found.       Cervix closed and long  Neurological: She is alert and oriented to person, place, and time.  Skin: Skin is warm and dry.  Psychiatric: She has a normal mood and affect.    MAU Course  Procedures  Results for orders placed during the hospital encounter of 08/21/11 (from the past 72 hour(s))  URINALYSIS, ROUTINE W REFLEX MICROSCOPIC     Status: Abnormal   Collection Time   08/21/11  5:45 PM      Component Value Range Comment  Color, Urine YELLOW  YELLOW     APPearance CLEAR  CLEAR     Specific Gravity, Urine 1.025  1.005 - 1.030     pH 6.0  5.0 - 8.0     Glucose, UA NEGATIVE  NEGATIVE (mg/dL)    Hgb urine dipstick LARGE (*) NEGATIVE     Bilirubin Urine NEGATIVE  NEGATIVE     Ketones, ur NEGATIVE  NEGATIVE (mg/dL)    Protein, ur NEGATIVE  NEGATIVE (mg/dL)    Urobilinogen, UA 0.2  0.0 - 1.0 (mg/dL)    Nitrite NEGATIVE  NEGATIVE     Leukocytes, UA TRACE (*) NEGATIVE    URINE MICROSCOPIC-ADD ON     Status: Abnormal   Collection Time   08/21/11  5:45 PM      Component Value Range  Comment   Squamous Epithelial / LPF MANY (*) RARE     WBC, UA 0-2  <3 (WBC/hpf)    Bacteria, UA FEW (*) RARE    WET PREP, GENITAL     Status: Abnormal   Collection Time   08/21/11  8:15 PM      Component Value Range Comment   Yeast Wet Prep HPF POC NONE SEEN  NONE SEEN     Trich, Wet Prep NONE SEEN  NONE SEEN     Clue Cells Wet Prep HPF POC FEW (*) NONE SEEN     WBC, Wet Prep HPF POC FEW (*) NONE SEEN  MODERATE BACTERIA SEEN   + FHR  Assessment and Plan  31 y.o. [id number] at [redacted]w[redacted]d Spotting - pelvic rest BV - rx flagyl F/U as scheduled, precautions rev'd Shelly Andrews 08/21/2011, 8:09 PM

## 2011-08-21 NOTE — MAU Note (Signed)
Patient states she had sudden onset of bright red vaginal bleeding at 1645 when going to the bathroom. Patient has on a pad that has a slight pink smear on it. Denies any pain. Fetal heart rate detected in triage.

## 2011-08-21 NOTE — MAU Note (Signed)
Pt had went to bathroom at work today and noticed red spotting when she wiped.  Denies cramping or discharge, pain or difficulty urinating.

## 2011-09-29 ENCOUNTER — Telehealth: Payer: Self-pay | Admitting: *Deleted

## 2011-09-29 NOTE — Telephone Encounter (Signed)
patient called in about appointment with md left desk nurse a message on 09-29-2011 to call the patient back at [phone]

## 2011-09-29 NOTE — Telephone Encounter (Signed)
On 09/22/11, rec'd referral as New Pt, however, patient is established with Dr Dalene Carrow in past for same referral reason. Information sent to desk nurse/MD to make appts.

## 2011-09-29 NOTE — Telephone Encounter (Signed)
Spoke with pt and informed her re:  Dr. Dalene Carrow will be back in office on 10/03/11.   Pt's information is left on md's desk for review .   Informed pt that a scheduler will contact pt with md's instructions.   Pt voiced understanding. Pt's  Phone    [phone].

## 2011-10-03 ENCOUNTER — Telehealth: Payer: Self-pay | Admitting: *Deleted

## 2011-10-03 ENCOUNTER — Other Ambulatory Visit: Payer: Self-pay | Admitting: Hematology and Oncology

## 2011-10-03 DIAGNOSIS — D693 Immune thrombocytopenic purpura: Secondary | ICD-10-CM

## 2011-10-03 NOTE — Telephone Encounter (Signed)
Spoke with pt and gave pt date and time for lab and f/u with md on 10/04/11  At 0930 am.   Pt voiced understanding.

## 2011-10-04 ENCOUNTER — Ambulatory Visit (HOSPITAL_BASED_OUTPATIENT_CLINIC_OR_DEPARTMENT_OTHER): Payer: Medicaid Other | Admitting: Hematology and Oncology

## 2011-10-04 ENCOUNTER — Other Ambulatory Visit (HOSPITAL_BASED_OUTPATIENT_CLINIC_OR_DEPARTMENT_OTHER): Payer: Medicaid Other | Admitting: Lab

## 2011-10-04 ENCOUNTER — Other Ambulatory Visit: Payer: Self-pay | Admitting: *Deleted

## 2011-10-04 ENCOUNTER — Telehealth: Payer: Self-pay | Admitting: *Deleted

## 2011-10-04 ENCOUNTER — Encounter: Payer: Self-pay | Admitting: Hematology and Oncology

## 2011-10-04 VITALS — BP 88/55 | HR 80 | Temp 97.1°F | Ht 62.5 in | Wt 126.8 lb

## 2011-10-04 DIAGNOSIS — D696 Thrombocytopenia, unspecified: Secondary | ICD-10-CM

## 2011-10-04 DIAGNOSIS — D6959 Other secondary thrombocytopenia: Secondary | ICD-10-CM

## 2011-10-04 DIAGNOSIS — D693 Immune thrombocytopenic purpura: Secondary | ICD-10-CM

## 2011-10-04 DIAGNOSIS — D689 Coagulation defect, unspecified: Secondary | ICD-10-CM

## 2011-10-04 LAB — COMPREHENSIVE METABOLIC PANEL
ALT: <8 U/L (ref 0–35)
AST: 12 U/L (ref 0–37)
Alkaline Phosphatase: 52 U/L (ref 39–117)
Sodium: 140 mEq/L (ref 135–145)
Total Bilirubin: 0.3 mg/dL (ref 0.3–1.2)
Total Protein: 5.4 g/dL — ABNORMAL LOW (ref 6.0–8.3)

## 2011-10-04 LAB — CBC WITH DIFFERENTIAL/PLATELET
EOS%: 0.9 % (ref 0.0–7.0)
MCH: 31.9 pg (ref 25.1–34.0)
MCV: 92.3 fL (ref 79.5–101.0)
MONO%: 4.8 % (ref 0.0–14.0)
NEUT#: 4.8 10*3/uL (ref 1.5–6.5)
RBC: 3.88 10*6/uL (ref 3.70–5.45)
RDW: 13.5 % (ref 11.2–14.5)
nRBC: 0 % (ref 0–0)

## 2011-10-04 NOTE — Progress Notes (Signed)
Dr.    [name]     -      [facility]      -      Primary  @   [address].  [facility]   On   [address]  [place]    Phone      [phone].

## 2011-10-04 NOTE — Patient Instructions (Signed)
Shelly Andrews  [id number]  [facility] Discharge Instructions  RECOMMENDATIONS MADE BY THE CONSULTANT AND ANY TEST RESULTS WILL BE SENT TO YOUR REFERRING DOCTOR.   EXAM FINDINGS BY MD TODAY AND SIGNS AND SYMPTOMS TO REPORT TO CLINIC OR PRIMARY MD:   Your current list of medications are: Current Outpatient Prescriptions  Medication Sig Dispense Refill  . acetaminophen (TYLENOL) 325 MG tablet Take 325 mg by mouth every 6 (six) hours as needed. For pain      . Prenatal Vit-Fe Fumarate-FA (PRENATAL MULTIVITAMIN) TABS Take 1 tablet by mouth every morning.          INSTRUCTIONS GIVEN AND DISCUSSED:   SPECIAL INSTRUCTIONS/FOLLOW-UP:  See above.  I acknowledge that I have been informed and understand all the instructions given to me and received a copy. I do not have any more questions at this time, but understand that I may call the [facility] at [phone] during business hours should I have any further questions or need assistance in obtaining follow-up care.

## 2011-10-04 NOTE — Progress Notes (Signed)
CC:   Shelly Cairo, MD  IDENTIFYING STATEMENT:  The patient is a 31 year old woman with history of gestational thrombocytopenia who is being seen at the request of Dr. Renaldo Fiddler.  HISTORY OF PRESENT ILLNESS:  Shelly Andrews has had 2 pregnancies with thrombocytopenia.  History is as follows.  She is gravida 2, para 3. She is currently at 19 weeks with her 3rd pregnancy. Three weeks ago, she tells me that her platelet counts were 108,000.  During her 2nd pregnancy, she was also found to have gestational thrombocytopenia.  She received prednisone and 2 doses of IVIG. Following delivery, her platelet counts did improve and were stable in the range of 130,000-150,000.  She has had uneventful deliveries both times with both children born healthy.  She also has had no issues with bruising or bleeding this pregnancy so far.  She feels well.  She tells me that the pregnancy is progressing quite well.  Of note, her family history is negative for ITP.  PAST MEDICAL HISTORY: 1. Status post LEEP procedure 1999. 2. History of gestational thrombocytopenia with 2 pregnancies.  MEDICATIONS:  Prenatal vitamins.  ALLERGIES:  Sulfa.  SOCIAL HISTORY:  She is engaged with 2 children ages 40-1/2 and 22-1/2 years old.  Denies history of alcohol or tobacco use.  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  FAMILY HISTORY:  The patient's maternal grandmother had breast cancer. No family history for hematologic disorders or ITP.  REVIEW OF SYSTEMS:  Constitutional:  Denies fever, chills, night sweats, anorexia, weight loss.  GI:  Denies nausea, vomiting, abdominal pain, diarrhea, melena, hematochezia.  GU:  Denies dysuria, hematuria, nocturia, frequency.  Skin:  No bruising or bleeding.  Neurologic: Denies headache, vision change, extremity weakness.  PHYSICAL EXAM:  The patient is alert and oriented x3.  Vitals:  Pulse 80, blood pressure 80/55, temperature 97.1, respirations 18, weight 126 pounds.  HEENT:  Head is  atraumatic, normocephalic.  Sclerae anicteric. Mouth moist.  Neck:  Supple.  Chest:  Clear to percussion and auscultation.  Abdomen:  Gravid.  Bowel sounds present.  Extremities: No edema.  Skin:  No bruising or bleeding.  CNS:  Nonfocal.  LAB DATA:  CBC obtained in the office 10/04/2011:  White cell count 7, hemoglobin 12.4, hematocrit 35.8, platelets 89.  Smear showed 2% myelocytes, no schistocyte and otherwise unremarkable.  CMET and LDH pending.  IMPRESSION AND PLAN:  Shelly Andrews is a 31 year old woman who is currently [redacted] weeks pregnant with her 3rd pregnancy.  Has a history of gestational thrombocytopenia with previous pregnancies.  She now has thrombocytopenia with no evidence of bruising.  Because of her known past history, I think it would be a good idea to go ahead and dose her with IVIG dosed at 1 mg per kg daily x2 doses with premedication with Tylenol and Benadryl 30 minutes prior to each infusion.  She will have this given with fetal monitoring at [facility].  I will inform the patient's OB/GYN Dr. Zelphia Andrews of this.  I also suggest she begin prednisone dosed at 30 mg p.o. daily with GI prophylaxis that is acceptable during pregnancy.  Will follow her labs closely.  The patient was also reminded that she will have to have her blood sugars monitored. I advised she follow up with Dr. Renaldo Fiddler in this regard.  She follows up soon.    ______________________________ Laurice Record, M.D. LIO/MEDQ  D:  10/04/2011  T:  10/04/2011  Job:  [id number]

## 2011-10-04 NOTE — Telephone Encounter (Signed)
Spoke with Elmarie Shiley, RN @ Dr.  [name],  And informed her that a copy of IVIG order was faxed to Dr. Renaldo Fiddler for review.    Spoke with Marchelle Folks @ A[facility] at [facility] , and informed her that IVIG order was in [facility] as well as a copy of order was faxed to her.  Asked Marchelle Folks  to let office know when pt is actually receiving IVIG -  So pt will be scheduled for a f/u appt with md.    Marchelle Folks  voiced understanding. Spoke with Okey Regal, charge nurse @ AICU earlier this am.   Informed her that Dr. Dalene Carrow would like for pt to receive IVIG x 2 days soon.    Okey Regal stated pt will be contacted with appt dates and times. AICU   Phone     [phone]   ,     Fax      [phone]. Dr.   Renaldo Fiddler'    Phone       [phone]     ;    Fax      [phone].

## 2011-10-04 NOTE — Progress Notes (Signed)
This office note has been dictated.

## 2011-10-05 ENCOUNTER — Ambulatory Visit (HOSPITAL_COMMUNITY)
Admission: AD | Admit: 2011-10-05 | Discharge: 2011-10-05 | Disposition: A | Discharge status: Home / Self Care | Payer: Medicaid Other | Source: Ambulatory Visit | Attending: Obstetrics and Gynecology | Admitting: Obstetrics and Gynecology

## 2011-10-05 DIAGNOSIS — O99119 Other diseases of the blood and blood-forming organs and certain disorders involving the immune mechanism complicating pregnancy, unspecified trimester: Secondary | ICD-10-CM | POA: Insufficient documentation

## 2011-10-05 DIAGNOSIS — D696 Thrombocytopenia, unspecified: Secondary | ICD-10-CM

## 2011-10-05 DIAGNOSIS — D693 Immune thrombocytopenic purpura: Secondary | ICD-10-CM | POA: Insufficient documentation

## 2011-10-05 DIAGNOSIS — D689 Coagulation defect, unspecified: Secondary | ICD-10-CM | POA: Insufficient documentation

## 2011-10-05 MED ORDER — IMMUNE GLOBULIN (HUMAN) 20 GM/200ML IV SOLN
1.0000 g/kg | INTRAVENOUS | Status: DC
  Administered 2011-10-05: 60 g via INTRAVENOUS
  Filled 2011-10-05: qty 600

## 2011-10-05 MED ORDER — ACETAMINOPHEN 325 MG PO TABS
650.0000 mg | ORAL_TABLET | Freq: Once | ORAL | Status: AC
  Administered 2011-10-05: 650 mg via ORAL
  Filled 2011-10-05: qty 2

## 2011-10-05 MED ORDER — ACETAMINOPHEN 325 MG PO TABS: 650.0000 mg | ORAL_TABLET | Freq: Once | ORAL | Status: DC

## 2011-10-05 MED ORDER — DEXTROSE 5 % IV SOLN
INTRAVENOUS | Status: DC
  Administered 2011-10-05: 16:00:00 via INTRAVENOUS

## 2011-10-05 MED ORDER — DIPHENHYDRAMINE HCL 25 MG PO CAPS: 25.0000 mg | ORAL_CAPSULE | Freq: Once | ORAL | Status: DC

## 2011-10-05 MED ORDER — LACTATED RINGERS IV SOLN: INTRAVENOUS | Status: DC

## 2011-10-05 MED ORDER — DIPHENHYDRAMINE HCL 25 MG PO CAPS
25.0000 mg | ORAL_CAPSULE | Freq: Once | ORAL | Status: AC
  Administered 2011-10-05: 25 mg via ORAL
  Filled 2011-10-05: qty 1

## 2011-10-05 NOTE — H&P (Signed)
31 year old G 3 P 1102 at 31 w 3 days presents for IVIG per Dr. Dalene Carrow. She has ITP. Orders written by Dr. Dalene Carrow. AICU will follow her protocol and then she will be discharged.

## 2011-10-06 ENCOUNTER — Telehealth: Payer: Self-pay | Admitting: Hematology and Oncology

## 2011-10-06 ENCOUNTER — Other Ambulatory Visit: Payer: Self-pay | Admitting: *Deleted

## 2011-10-06 ENCOUNTER — Ambulatory Visit (HOSPITAL_COMMUNITY)
Admission: AD | Admit: 2011-10-06 | Discharge: 2011-10-06 | Disposition: A | Discharge status: Home / Self Care | Payer: Medicaid Other | Source: Ambulatory Visit | Attending: Obstetrics and Gynecology | Admitting: Obstetrics and Gynecology

## 2011-10-06 DIAGNOSIS — D693 Immune thrombocytopenic purpura: Secondary | ICD-10-CM | POA: Insufficient documentation

## 2011-10-06 DIAGNOSIS — D696 Thrombocytopenia, unspecified: Secondary | ICD-10-CM

## 2011-10-06 DIAGNOSIS — D689 Coagulation defect, unspecified: Principal | ICD-10-CM | POA: Insufficient documentation

## 2011-10-06 MED ORDER — DIPHENHYDRAMINE HCL 25 MG PO CAPS
25.0000 mg | ORAL_CAPSULE | Freq: Once | ORAL | Status: AC
  Administered 2011-10-06: 25 mg via ORAL
  Filled 2011-10-06: qty 1

## 2011-10-06 MED ORDER — ACETAMINOPHEN 325 MG PO TABS
650.0000 mg | ORAL_TABLET | Freq: Once | ORAL | Status: AC
  Administered 2011-10-06: 650 mg via ORAL
  Filled 2011-10-06: qty 1

## 2011-10-06 MED ORDER — IMMUNE GLOBULIN (HUMAN) 20 GM/200ML IV SOLN
1.0000 g/kg | Freq: Once | INTRAVENOUS | Status: AC
  Administered 2011-10-06: 60 g via INTRAVENOUS
  Filled 2011-10-06: qty 600

## 2011-10-06 NOTE — Telephone Encounter (Signed)
called pt lmovm for appts on 06/20.  asked pt to rtn call to confirm appts

## 2011-10-06 NOTE — Telephone Encounter (Signed)
pt rtn call and confirmed appt for 06/20

## 2011-10-06 NOTE — Progress Notes (Signed)
IVIG infusion completed at 18:45.  Pt tolerated infusion well.  Phoned Dr. Rana Snare for discharge order and was told by the OR RN that he was just beginning a STAT c/s and would phone unit when procedure concludes.  Updated pt on discharge timing.

## 2011-10-06 NOTE — Progress Notes (Signed)
Patient discharged home with family member.  Verbalized understanding of discharged instruction

## 2011-10-08 NOTE — H&P (Signed)
31 year old G 3 P 1102 at 59 w 3 days presents for IVIG per Dr. Dalene Carrow. She has ITP.  Orders written by Dr. Dalene Carrow.  AICU will follow her protocol and then she will be disch

## 2011-10-08 NOTE — Discharge Summary (Signed)
  31 year old G 3 P 1102 at 85 w 3 days presents for IVIG per Dr. Dalene Carrow. She has ITP.  Orders written by Dr. Dalene Carrow.  AICU will follow her protocol and then she will be disch

## 2011-10-09 ENCOUNTER — Other Ambulatory Visit: Payer: Self-pay | Admitting: *Deleted

## 2011-10-09 DIAGNOSIS — O99119 Other diseases of the blood and blood-forming organs and certain disorders involving the immune mechanism complicating pregnancy, unspecified trimester: Secondary | ICD-10-CM

## 2011-10-09 MED ORDER — PREDNISONE 10 MG PO TABS: 30.0000 mg | ORAL_TABLET | Freq: Every day | ORAL | Status: DC

## 2011-10-26 ENCOUNTER — Telehealth: Payer: Self-pay | Admitting: Hematology and Oncology

## 2011-10-26 ENCOUNTER — Other Ambulatory Visit (HOSPITAL_BASED_OUTPATIENT_CLINIC_OR_DEPARTMENT_OTHER): Payer: Medicaid Other | Admitting: Lab

## 2011-10-26 ENCOUNTER — Encounter: Payer: Self-pay | Admitting: Hematology and Oncology

## 2011-10-26 ENCOUNTER — Ambulatory Visit (HOSPITAL_BASED_OUTPATIENT_CLINIC_OR_DEPARTMENT_OTHER): Payer: Medicaid Other | Admitting: Hematology and Oncology

## 2011-10-26 VITALS — BP 89/64 | HR 90 | Temp 97.1°F | Ht 63.0 in | Wt 133.1 lb

## 2011-10-26 DIAGNOSIS — D693 Immune thrombocytopenic purpura: Secondary | ICD-10-CM

## 2011-10-26 DIAGNOSIS — D696 Thrombocytopenia, unspecified: Secondary | ICD-10-CM

## 2011-10-26 LAB — CBC WITH DIFFERENTIAL/PLATELET
Basophils Absolute: 0 10*3/uL (ref 0.0–0.1)
EOS%: 0.6 % (ref 0.0–7.0)
HCT: 34.1 % — ABNORMAL LOW (ref 34.8–46.6)
HGB: 11.5 g/dL — ABNORMAL LOW (ref 11.6–15.9)
LYMPH%: 26.5 % (ref 14.0–49.7)
MCH: 31.7 pg (ref 25.1–34.0)
NEUT%: 67.8 % (ref 38.4–76.8)
Platelets: 87 10*3/uL — ABNORMAL LOW (ref 145–400)
lymph#: 2.6 10*3/uL (ref 0.9–3.3)

## 2011-10-26 NOTE — Progress Notes (Signed)
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  INTERVAL HISTORY:  Mrs. Bartok received 2 doses of IVIG on 10/05/2011 and 10/06/2011 for a platelet count of 87.  She is presently taking prednisone 30 mg daily.  She presently has no issues or concerns.  She has had no bleeding.  Her pregnancy she tells me appears to be progressing well.  Today's CBC 10/26/2011 notes a white cell count of 9.8, hemoglobin 11.5, hematocrit 34.1, platelets 87 (89).  MEDICATIONS:  Prednisone 30 mg daily, prenatal vitamins, Prevacid 60 mg daily.  PHYSICAL EXAM:  Patient is alert and oriented x3.  Vitals:  Pulse 90, blood pressure 89/64, temperature 97.1, respirations 18, weight 133 pounds.  HEENT:  Head is atraumatic, normocephalic.  Sclerae anicteric. Mouth moist.  Neck:  Supple.  Chest:  Clear to percussion and auscultation.  CVS:  Unremarkable.  Abdomen:  Gravid.  Extremities:  No edema.  LAB DATA:  CBC as above.  IMPRESSION AND PLAN:  Mrs. Wich is a 31 year old woman who is currently [redacted] weeks pregnant.  Has history of gestational thrombocytopenia with previous pregnancies.  Received IVIG 2 doses on 05/30 and 05/31.  Her platelet counts generally have remained stable.  I would like for her to see if she will tolerate increasing prednisone to 30 mg p.o. twice daily.  We will continue to her labs, and she will have labs drawn in 2 weeks' time.  She follows up in a month's time.    ______________________________ Laurice Record, M.D. LIO/MEDQ  D:  10/26/2011  T:  10/26/2011  Job:  [id number]

## 2011-10-26 NOTE — Patient Instructions (Signed)
[name]  [id number]  [facility] Discharge Instructions  RECOMMENDATIONS MADE BY THE CONSULTANT AND ANY TEST RESULTS WILL BE SENT TO YOUR REFERRING DOCTOR.   EXAM FINDINGS BY MD TODAY AND SIGNS AND SYMPTOMS TO REPORT TO CLINIC OR PRIMARY MD:   Your current list of medications are: Current Outpatient Prescriptions  Medication Sig Dispense Refill  . lansoprazole (PREVACID) 15 MG capsule Take 15 mg by mouth as needed.      . predniSONE (DELTASONE) 10 MG tablet Take 3 tablets (30 mg total) by mouth daily.  100 tablet  1  . Prenatal Vit-Fe Fumarate-FA (PRENATAL MULTIVITAMIN) TABS Take 1 tablet by mouth every morning.          INSTRUCTIONS GIVEN AND DISCUSSED:   SPECIAL INSTRUCTIONS/FOLLOW-UP:  See above.  I acknowledge that I have been informed and understand all the instructions given to me and received a copy. I do not have any more questions at this time, but understand that I may call the [facility] at [phone] during business hours should I have any further questions or need assistance in obtaining follow-up care.

## 2011-10-26 NOTE — Progress Notes (Signed)
This office note has been dictated.

## 2011-10-26 NOTE — Telephone Encounter (Signed)
Gave pt appt for July  2013 lab and MD 

## 2011-11-08 ENCOUNTER — Other Ambulatory Visit (HOSPITAL_BASED_OUTPATIENT_CLINIC_OR_DEPARTMENT_OTHER): Payer: Medicaid Other | Admitting: Lab

## 2011-11-08 ENCOUNTER — Telehealth: Payer: Self-pay | Admitting: *Deleted

## 2011-11-08 DIAGNOSIS — D693 Immune thrombocytopenic purpura: Secondary | ICD-10-CM

## 2011-11-08 LAB — CBC WITH DIFFERENTIAL/PLATELET
Basophils Absolute: 0 10*3/uL (ref 0.0–0.1)
EOS%: 0.1 % (ref 0.0–7.0)
HCT: 34.8 % (ref 34.8–46.6)
HGB: 11.6 g/dL (ref 11.6–15.9)
MCH: 31.3 pg (ref 25.1–34.0)
MCV: 93.6 fL (ref 79.5–101.0)
MONO%: 6.7 % (ref 0.0–14.0)
NEUT%: 78.5 % — ABNORMAL HIGH (ref 38.4–76.8)
RDW: 13.8 % (ref 11.2–14.5)

## 2011-11-08 NOTE — Telephone Encounter (Signed)
Spoke with pt and informed pt re:  Per Dr. Dalene Carrow, continue with  Prednisone   30 mg  BID  Since platelets increased to  96  Today.    Confirmed next appts for 11/24/11.   Pt voiced understanding.

## 2011-11-20 ENCOUNTER — Other Ambulatory Visit: Payer: Self-pay | Admitting: *Deleted

## 2011-11-20 DIAGNOSIS — D696 Thrombocytopenia, unspecified: Secondary | ICD-10-CM

## 2011-11-20 MED ORDER — PREDNISONE 10 MG PO TABS: 30.0000 mg | ORAL_TABLET | Freq: Two times a day (BID) | ORAL | Status: DC

## 2011-11-24 ENCOUNTER — Other Ambulatory Visit (HOSPITAL_BASED_OUTPATIENT_CLINIC_OR_DEPARTMENT_OTHER): Payer: Medicaid Other | Admitting: Lab

## 2011-11-24 ENCOUNTER — Ambulatory Visit (HOSPITAL_BASED_OUTPATIENT_CLINIC_OR_DEPARTMENT_OTHER): Payer: Medicaid Other | Admitting: Hematology and Oncology

## 2011-11-24 ENCOUNTER — Encounter: Payer: Self-pay | Admitting: Hematology and Oncology

## 2011-11-24 ENCOUNTER — Telehealth: Payer: Self-pay | Admitting: *Deleted

## 2011-11-24 VITALS — BP 105/64 | HR 92 | Temp 97.6°F | Ht 63.0 in | Wt 136.7 lb

## 2011-11-24 DIAGNOSIS — D693 Immune thrombocytopenic purpura: Secondary | ICD-10-CM

## 2011-11-24 DIAGNOSIS — O99119 Other diseases of the blood and blood-forming organs and certain disorders involving the immune mechanism complicating pregnancy, unspecified trimester: Secondary | ICD-10-CM

## 2011-11-24 LAB — CBC WITH DIFFERENTIAL/PLATELET
BASO%: 0 % (ref 0.0–2.0)
EOS%: 0 % (ref 0.0–7.0)
MCH: 31.7 pg (ref 25.1–34.0)
MCV: 94.1 fL (ref 79.5–101.0)
MONO%: 4.3 % (ref 0.0–14.0)
RBC: 3.68 10*6/uL — ABNORMAL LOW (ref 3.70–5.45)
RDW: 13.8 % (ref 11.2–14.5)

## 2011-11-24 NOTE — Progress Notes (Signed)
CC:   [name], MD  IDENTIFYING STATEMENT:  Patient is a 31 year old woman with ITP who presents for followup.  INTERVAL HISTORY:  Mrs. Derenzo is [redacted] weeks pregnant.  Is on prednisone for ITP.  Has no bruising or bleeding.  Has insomnia at night.  Tells me that her blood sugars have become erratic and obstetrician plans to begin some form of oral hypoglycemic.  Pregnancy is progressing well.  Today's CBC 11/24/2011 white cell count 8.2, hemoglobin 11.7, hematocrit 34.7, platelets 91 (96).  MEDICATIONS:  Prednisone 30 mg b.i.d.  PHYSICAL EXAM:  Patient is alert and oriented x3.  Vitals:  Pulse 92, blood pressure 105/64, temperature 97.6, respirations 20, weight 136 pounds.  HEENT:  Head is atraumatic, normocephalic.  Sclerae anicteric. Mouth moist.  Chest:  Clear.  CVS:  Unremarkable.  Abdomen:  Soft, nontender.  Bowel sounds present.  Extremities:  No edema.  LABORATORY DATA:  As above.  BMET pending.  IMPRESSION AND PLAN:  Mrs. 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  Status post IVIG x2 doses on 05/13 and 05/31.  She is presently on prednisone 30 mg b.i.d. Platelets remained stable and range between 80 and 95,000.  Will continue to monitor.  Follows up in 2 weeks' time for labs.  Has an office visit in a month's time.    ______________________________ Laurice Record, M.D. LIO/MEDQ  D:  11/24/2011  T:  11/24/2011  Job:  [id number]

## 2011-11-24 NOTE — Telephone Encounter (Signed)
APPTS MADE AND PRINTED FOR PT AOM °

## 2011-11-24 NOTE — Progress Notes (Signed)
This office note has been dictated.

## 2011-11-24 NOTE — Patient Instructions (Signed)
Shelly Andrews  [id number]  [facility] Discharge Instructions  RECOMMENDATIONS MADE BY THE CONSULTANT AND ANY TEST RESULTS WILL BE SENT TO YOUR REFERRING DOCTOR.   EXAM FINDINGS BY MD TODAY AND SIGNS AND SYMPTOMS TO REPORT TO CLINIC OR PRIMARY MD:   Your current list of medications are: Current Outpatient Prescriptions  Medication Sig Dispense Refill  . pantoprazole (PROTONIX) 20 MG tablet Take 20 mg by mouth as needed.      . predniSONE (DELTASONE) 10 MG tablet Take 3 tablets (30 mg total) by mouth 2 (two) times daily.  100 tablet  0  . Prenatal Vit-Fe Fumarate-FA (PRENATAL MULTIVITAMIN) TABS Take 1 tablet by mouth every morning.          INSTRUCTIONS GIVEN AND DISCUSSED:   SPECIAL INSTRUCTIONS/FOLLOW-UP:  See above.  I acknowledge that I have been informed and understand all the instructions given to me and received a copy. I do not have any more questions at this time, but understand that I may call the [facility] at [phone] during business hours should I have any further questions or need assistance in obtaining follow-up care.

## 2011-11-30 ENCOUNTER — Other Ambulatory Visit: Payer: Self-pay | Admitting: *Deleted

## 2011-11-30 DIAGNOSIS — D693 Immune thrombocytopenic purpura: Secondary | ICD-10-CM

## 2011-11-30 MED ORDER — GLUCOSE BLOOD VI STRP: ORAL_STRIP | Status: DC

## 2011-12-06 ENCOUNTER — Other Ambulatory Visit: Payer: Self-pay | Admitting: Oncology

## 2011-12-06 ENCOUNTER — Encounter: Payer: Medicaid Other | Attending: Obstetrics and Gynecology | Admitting: Dietician

## 2011-12-06 VITALS — Ht 64.0 in | Wt 138.4 lb

## 2011-12-06 DIAGNOSIS — Z713 Dietary counseling and surveillance: Secondary | ICD-10-CM | POA: Insufficient documentation

## 2011-12-06 DIAGNOSIS — O99810 Abnormal glucose complicating pregnancy: Secondary | ICD-10-CM | POA: Insufficient documentation

## 2011-12-06 DIAGNOSIS — O24419 Gestational diabetes mellitus in pregnancy, unspecified control: Secondary | ICD-10-CM

## 2011-12-07 ENCOUNTER — Other Ambulatory Visit: Payer: Self-pay | Admitting: Nurse Practitioner

## 2011-12-07 ENCOUNTER — Encounter: Payer: Self-pay | Admitting: Dietician

## 2011-12-07 DIAGNOSIS — D696 Thrombocytopenia, unspecified: Secondary | ICD-10-CM

## 2011-12-07 DIAGNOSIS — O99119 Other diseases of the blood and blood-forming organs and certain disorders involving the immune mechanism complicating pregnancy, unspecified trimester: Secondary | ICD-10-CM

## 2011-12-07 MED ORDER — PREDNISONE 10 MG PO TABS: 30.0000 mg | ORAL_TABLET | Freq: Two times a day (BID) | ORAL | Status: DC

## 2011-12-07 NOTE — Progress Notes (Signed)
  Patient was seen on 12/06/2011 for Gestational Diabetes self-management class at [facility]. The following learning objectives were met by the patient during this course:   States the definition of Gestational Diabetes  States why dietary management is important in controlling blood glucose  Describes the effects each nutrient has on blood glucose levels  Demonstrates ability to create a balanced meal plan  Demonstrates carbohydrate counting   States when to check blood glucose levels  Demonstrates proper blood glucose monitoring techniques  States the effect of stress and exercise on blood glucose levels  States the importance of limiting caffeine and abstaining from alcohol and smoking  Blood glucose monitor given: Currently has a Reli-[employer] meter and is testing.  Has been placed on Prednisone 30 mg twice daily and this has increased her blood glucose. Thus she is already monitoring and is taking Glyburide 2.5 mg at HS and AC breakfast at this time.  Blood glucose reading: Last reading this afternoon was at 112  Patient instructed to monitor glucose levels: FBS: 60 - <90 2 hour: <120  Patient received handouts:  Nutrition Diabetes and Pregnancy  Carbohydrate Counting List  Patient will be seen for follow-up as needed.

## 2011-12-08 ENCOUNTER — Other Ambulatory Visit (HOSPITAL_BASED_OUTPATIENT_CLINIC_OR_DEPARTMENT_OTHER): Payer: Medicaid Other | Admitting: Lab

## 2011-12-08 ENCOUNTER — Telehealth: Payer: Self-pay | Admitting: Nurse Practitioner

## 2011-12-08 DIAGNOSIS — D693 Immune thrombocytopenic purpura: Secondary | ICD-10-CM

## 2011-12-08 LAB — CBC WITH DIFFERENTIAL/PLATELET
Eosinophils Absolute: 0 10*3/uL (ref 0.0–0.5)
MONO#: 0.5 10*3/uL (ref 0.1–0.9)
MONO%: 5.5 % (ref 0.0–14.0)
NEUT#: 8.4 10*3/uL — ABNORMAL HIGH (ref 1.5–6.5)
RBC: 3.85 10*6/uL (ref 3.70–5.45)
RDW: 14 % (ref 11.2–14.5)
WBC: 9.9 10*3/uL (ref 3.9–10.3)

## 2011-12-08 NOTE — Telephone Encounter (Signed)
Notified pt to continue prednisone at 30mg  BID per Dr. Dalene Carrow- pt verbalized understanding.

## 2011-12-16 ENCOUNTER — Encounter (HOSPITAL_COMMUNITY): Payer: Self-pay | Admitting: *Deleted

## 2011-12-16 ENCOUNTER — Emergency Department (HOSPITAL_COMMUNITY)
Admission: EM | Admit: 2011-12-16 | Discharge: 2011-12-16 | Disposition: A | Discharge status: Home / Self Care | Payer: Medicaid Other | Source: Home / Self Care | Attending: Family Medicine | Admitting: Family Medicine

## 2011-12-16 DIAGNOSIS — B370 Candidal stomatitis: Secondary | ICD-10-CM

## 2011-12-16 HISTORY — DX: Encounter for supervision of normal pregnancy, unspecified, unspecified trimester: Z34.90

## 2011-12-16 LAB — POCT RAPID STREP A: Streptococcus, Group A Screen (Direct): NEGATIVE

## 2011-12-16 MED ORDER — NYSTATIN 100000 UNIT/ML MT SUSP: 500000.0000 [IU] | Freq: Four times a day (QID) | OROMUCOSAL | Status: AC

## 2011-12-16 NOTE — ED Notes (Signed)
Pt with onset of sore throat x approx one week - white patches back of throat

## 2011-12-16 NOTE — ED Provider Notes (Signed)
History     CSN: [id number]  Arrival date & time 12/16/11  1133   First MD Initiated Contact with Patient 12/16/11 1138      Chief Complaint  Patient presents with  . Sore Throat    (Consider location/radiation/quality/duration/timing/severity/associated sxs/prior treatment) HPI Comments: 31 year old female with history of thrombocytopenia and gestational diabetes currently 29 weeks and 5 days pregnant. Here complaining of mouth and sore throat with white patches for about one week. Has had oral candidiasis in the past. She is otherwise feeling well. Denies fever or chills. No body rashes. No headache or dizziness. No nausea vomiting or diarrhea. Patient is on Protonix for acid reflux and chronic prednisone for thrombocytopenia treatment. Has been able to eat solids and drink fluids with minimal discomfort. No weight loss.  Feels baby moving, no abdominal pain contractions or vaginal bleeding.    Past Medical History  Diagnosis Date  . Thrombocytopenia     Idopathic with pregnancy  . No pertinent past medical history   . Pregnancy   . Gestational diabetes     Past Surgical History  Procedure Date  . No past surgeries     History reviewed. No pertinent family history.  History  Substance Use Topics  . Smoking status: Never Smoker   . Smokeless tobacco: Not on file  . Alcohol Use: No     1-2 on weekend    OB History    Grav Para Term Preterm Abortions TAB SAB Ect Mult Living   3 2 1 1  0 0 0 0 0 2      Review of Systems  Constitutional: Negative for fever, chills, activity change, appetite change and fatigue.       10 systems reviewed and  pertinent negative and positive symptoms are as per HPI.     HENT: Positive for sore throat and mouth sores. Negative for congestion.        As per HPI  Respiratory: Negative for cough.   Skin: Negative for rash.       No bruising  Neurological: Negative for dizziness and headaches.  All other systems reviewed and are  negative.    Allergies  Sulfa antibiotics  Home Medications   Current Outpatient Rx  Name Route Sig Dispense Refill  . GLYBURIDE 2.5 MG PO TABS Oral Take 2.5 mg by mouth daily with breakfast. Daily before breakfast and at bedtime    . PANTOPRAZOLE SODIUM 20 MG PO TBEC Oral Take 20 mg by mouth as needed.    Marland Kitchen PREDNISONE 10 MG PO TABS Oral Take 3 tablets (30 mg total) by mouth 2 (two) times daily. 100 tablet 1  . PRENATAL MULTIVITAMIN CH Oral Take 1 tablet by mouth every morning.     Marland Kitchen GLUCOSE BLOOD VI STRP  Use as instructed 100 each 1  . NYSTATIN 100000 UNIT/ML MT SUSP Oral Take 5 mLs (500,000 Units total) by mouth 4 (four) times daily. Retain in mouth as long as possible and continue to use for x 48 hours after symptoms resolved. 120 mL 0    BP 114/66  Pulse 86  Temp 98.8 F (37.1 C) (Oral)  Resp 20  SpO2 100%  LMP 05/22/2011  Physical Exam  Nursing note and vitals reviewed. Constitutional: She is oriented to person, place, and time. She appears well-developed and well-nourished. No distress.  HENT:  Head: Normocephalic and atraumatic.  Right Ear: External ear normal.  Left Ear: External ear normal.  Nose: Nose normal.  OP is moist.  There is white discoloration of the tongue with few tongue fissures. Also white patches/spots in buccal lateral mucosa and soft palate.   Eyes: Conjunctivae and EOM are normal. Pupils are equal, round, and reactive to light. Right eye exhibits no discharge. Left eye exhibits no discharge. No scleral icterus.  Neck: Normal range of motion. Neck supple. No thyromegaly present.  Cardiovascular: Normal heart sounds.   Pulmonary/Chest: Breath sounds normal.  Abdominal:       Pregnant abdomen, no tender.  Lymphadenopathy:    She has no cervical adenopathy.  Neurological: She is alert and oriented to person, place, and time.  Skin: No rash noted. She is not diaphoretic.    ED Course  Procedures (including critical care time)   Labs  Reviewed  POCT RAPID STREP A ([facility] CARE ONLY)   No results found.   1. Oral thrush       MDM  Impress oral candidiasis. Patient on chronic steroids, gestational diabetes and pregnancy might predispose, otherwise doing well. Prescribed nystatin swish and swallow 500,000 units 4 times a day until 48 hours after symptoms resolved. Asked to followup with her OB specialist or return to medical attention if persistent or worsening symptoms despite following treatment.        Sharin Grave, MD 12/17/11 [phone]

## 2011-12-29 ENCOUNTER — Telehealth: Payer: Self-pay | Admitting: Hematology and Oncology

## 2011-12-29 ENCOUNTER — Ambulatory Visit (HOSPITAL_COMMUNITY)
Admission: RE | Admit: 2011-12-29 | Discharge: 2011-12-29 | Disposition: A | Discharge status: Home / Self Care | Payer: Medicaid Other | Source: Ambulatory Visit | Attending: Hematology and Oncology | Admitting: Hematology and Oncology

## 2011-12-29 ENCOUNTER — Inpatient Hospital Stay (HOSPITAL_COMMUNITY)
Admission: AD | Admit: 2011-12-29 | Discharge: 2011-12-29 | Disposition: A | Discharge status: Home / Self Care | Payer: Medicaid Other | Source: Ambulatory Visit | Attending: Obstetrics & Gynecology | Admitting: Obstetrics & Gynecology

## 2011-12-29 ENCOUNTER — Encounter (HOSPITAL_COMMUNITY): Payer: Self-pay | Admitting: *Deleted

## 2011-12-29 ENCOUNTER — Encounter: Payer: Self-pay | Admitting: Hematology and Oncology

## 2011-12-29 ENCOUNTER — Ambulatory Visit (HOSPITAL_BASED_OUTPATIENT_CLINIC_OR_DEPARTMENT_OTHER): Payer: Medicaid Other | Admitting: Hematology and Oncology

## 2011-12-29 ENCOUNTER — Inpatient Hospital Stay (HOSPITAL_COMMUNITY): Payer: Medicaid Other

## 2011-12-29 ENCOUNTER — Other Ambulatory Visit (HOSPITAL_BASED_OUTPATIENT_CLINIC_OR_DEPARTMENT_OTHER): Payer: Medicaid Other | Admitting: Lab

## 2011-12-29 VITALS — BP 111/73 | HR 84 | Temp 97.0°F | Resp 20 | Ht 64.0 in | Wt 141.2 lb

## 2011-12-29 DIAGNOSIS — D693 Immune thrombocytopenic purpura: Secondary | ICD-10-CM

## 2011-12-29 DIAGNOSIS — M79609 Pain in unspecified limb: Secondary | ICD-10-CM

## 2011-12-29 DIAGNOSIS — O364XX0 Maternal care for intrauterine death, not applicable or unspecified: Secondary | ICD-10-CM | POA: Insufficient documentation

## 2011-12-29 DIAGNOSIS — O99119 Other diseases of the blood and blood-forming organs and certain disorders involving the immune mechanism complicating pregnancy, unspecified trimester: Secondary | ICD-10-CM

## 2011-12-29 LAB — CBC WITH DIFFERENTIAL/PLATELET
BASO%: 0.1 % (ref 0.0–2.0)
HCT: 36.2 % (ref 34.8–46.6)
HGB: 12.2 g/dL (ref 11.6–15.9)
MCHC: 33.8 g/dL (ref 31.5–36.0)
MONO#: 0.5 10*3/uL (ref 0.1–0.9)
NEUT%: 85 % — ABNORMAL HIGH (ref 38.4–76.8)
WBC: 9.4 10*3/uL (ref 3.9–10.3)
lymph#: 0.9 10*3/uL (ref 0.9–3.3)

## 2011-12-29 LAB — FIBRINOGEN: Fibrinogen: 508 mg/dL — ABNORMAL HIGH (ref 204–475)

## 2011-12-29 LAB — CBC
Platelets: 92 10*3/uL — ABNORMAL LOW (ref 150–400)
RBC: 4.36 MIL/uL (ref 3.87–5.11)
WBC: 10 10*3/uL (ref 4.0–10.5)

## 2011-12-29 NOTE — Progress Notes (Signed)
Chaplain Note: Arrived at [facility] about 5:25 pm for a 4:50 pm page:  The patient was in her room with her grandmother.  Her emotions were a roller coaster.  She reported the baby has a vibrant heart beat Tuesday when she went to the doctor, but today she felt no movement and the hospital staff confirmed the baby was dead.  She has two younger girls, ages 2 and 60.  The 31 year old has already grown close to her younger brother Denny Peon Neil(sp).  The husband, as of this entry, does not know yet.  Neither do the children, although they know "mom" is at the hospital.  She is still carrying the baby.  She has been given the option of going home for a couple of days or staying at the hospital.  She said she is inclined (as of the time the Chaplain spoke to her) to go home.  The Chaplain affirmed her feelings, affirmed her swings in emotions, encouraged her to not feel guilty, and affirmed that her "why" may never be answered.  Chaplain prayed with the patient, grandmother and mother (who came later).  The Chaplain also pulled a number of resources off the Internet for her to get some insight into how to talk with her other children.  He also praised her for how she brought her daughters into the pregnancy.  The Chaplain spent about 65 minutes total with the family, including going back into the room before he left to deliver the materials, and about 30 pulling down and editing resources.    When the patient returns to the hospital, she should definitely be seen by the day chaplain(s).  Rema Jasmine, Chaplain Pager: [phone]

## 2011-12-29 NOTE — MAU Note (Addendum)
S: Pt presented to MAU with complaint of no fetal movement since last night.  She denies vaginal bleeding, abdominal pain or change in general status.  She had a feeling "something wasn't right."  She has had some irregular but uncomfortable CTX.  She was last in the office on Tuesday when FHTs were clearly heard and active fetal movement was documented.  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  She has been on prednisone.  Of note, she takes Glyburide 5 BID for A2DM and reports poor glycemic control lately.    O: RN unable to locate FHT and CNM performed bedside ultrasound with absent FHT noted.  Formal ultrasound was performed confirming the same.    VSS. AF.  Abd soft. Skin without petechiae PLT 92, fibrinogen 508  Patient, mother, and grandmother were counseled regarding the findings.  She was given the option to go home tonight and return tomorrow vs starting the induction tonight.  She has elected to go home and spend time with her two daughters and process this devastating news.  She is counseled regarding the risk of DIC especially in light of her thrombocytopenia.  She expressed understanding and will be a direct admit tomorrow AM to L&D.  She is also counseled regarding the option to investigate the etiology of the demise and she would like a full workup.   A: 31yo [id number] at [redacted]w[redacted]d with IUFD  P: Direct admit to L&D at [facility] support hemodynamic stability; no coagulopathy presently. Chaplain care   MM

## 2011-12-29 NOTE — MAU Note (Signed)
Pt states having contractions for months, hx PTL, delivered at 35 weeks w 2nd pregnancy. Denies bleeding or lof. Contractions intermittent. States movement was decreased since last pm.

## 2011-12-29 NOTE — Patient Instructions (Signed)
Shelly Andrews  [id number]    [facility] - AFTER VISIT SUMMARY   **RECOMMENDATIONS MADE BY THE CONSULTANT AND ANY TEST    RESULTS WILL BE SENT TO YOUR REFERRING DOCTORS.   YOUR EXAM FINDINGS, LABS AND RESULTS WERE DISCUSSED BY YOUR MD TODAY.    Your vital signs are: There were no vitals filed for this visit.  Your Updated drug allergies are: Allergies as of 12/29/2011 - Review Complete 12/16/2011  Allergen Reaction Noted  . Sulfa antibiotics Hives 06/24/2011    Your current list of medications are: Current Outpatient Prescriptions  Medication Sig Dispense Refill  . glucose blood test strip Use as instructed  100 each  1  . glyBURIDE (DIABETA) 2.5 MG tablet Take 2.5 mg by mouth daily with breakfast. Daily before breakfast and at bedtime      . pantoprazole (PROTONIX) 20 MG tablet Take 20 mg by mouth as needed.      . predniSONE (DELTASONE) 10 MG tablet Take 3 tablets (30 mg total) by mouth 2 (two) times daily.  100 tablet  1  . Prenatal Vit-Fe Fumarate-FA (PRENATAL MULTIVITAMIN) TABS Take 1 tablet by mouth every morning.          INSTRUCTIONS GIVEN AND DISCUSSED:  See attached schedule   SPECIAL INSTRUCTIONS/FOLLOW-UP:  See above.  I acknowledge that I have been informed and understand all the instructions given to me and received a copy. I do not have any more questions at this time, but understand that I may call the [facility] at [phone] during business hours should I have any further questions or need assistance in obtaining follow-up care.

## 2011-12-29 NOTE — MAU Note (Signed)
Pt presented to MAU for contractions and decreased fetal movement since 2300 last night.  Upon arrival the triage nurse was unable to find FHT.  Maylon Cos, CNM then came to bedside with portable US and was unable to visualize cardiac activity.  Official US then performed at bedside and confirmed IUFD.  Pt called family and her mother/grandmother at bedside.  Chaplain also at bedside.  Dr. Langston Masker discussed POC with patient, and came to the decision that patient would go home for the night and come back in the morning for an induction.

## 2011-12-29 NOTE — Progress Notes (Signed)
Right:  No evidence of DVT, superficial thrombosis, or Baker's cyst.  Left:  Negative for DVT in the common femoral vein.  

## 2011-12-29 NOTE — MAU Provider Note (Signed)
Pt reports no fetal movement since last night. [id number] at [redacted]w[redacted]d. RN unable to doppler FHT. Informal bedside US reveals no cardiac activity. Formal US ordered to confirm IUFD. Dr. Langston Masker notified of findings, coming to see pt now.   Fidela Cieslak E. 4:48 PM

## 2011-12-29 NOTE — Telephone Encounter (Signed)
Gave pt appt for August and September 2014 labs and MD

## 2011-12-29 NOTE — MAU Note (Signed)
Pt presents to MAU for decreased fetal movement and contractions.

## 2011-12-29 NOTE — Progress Notes (Signed)
CC:   Shelly Cairo, MD  IDENTIFYING STATEMENT:  The patient is a 31 year old woman with ITP and pregnant who presents for followup.  INTERVAL HISTORY:  Shelly Andrews continues on prednisone, which she appears to be tolerating well.  She informs me that her due date is February 26, 2012.  She also tells me that she has had tenderness in the right calf for a week.  She has had no bruising or bleeding.  LABORATORY DATA:  White cell count 9.4, hemoglobin 12.2, hematocrit 36.2, platelets 79 (100).  MEDICATIONS:  Prednisone 30 mg b.i.d.  Prenatal vitamins.  PHYSICAL EXAMINATION:  General:  The patient is alert and oriented x3. Vitals:  Pulse 84, blood pressure 111/73, temperature 97, respirations 20, weight 141.2 pounds.  HEENT:  Head is atraumatic.  Mouth moist. Chest:  Clear.  Abdomen:  Gravid.  Extremities:  Tenderness in the right calf.  No edema.  LABORATORY DATA:  CBC as above.  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  Status post IVIG x2 doses on 05/15 and 05/31.  Is currently on prednisone 30 mg b.i.d.  Her platelets have decreased.  We will have her return in a week's time for a CBC and see where we are at at that time.  She is to monitor for bleeding.  With regards to calf tenderness, we will have her proceed for a Doppler to rule out deep vein thrombosis. If all is well, she follows up in a month's time.    ______________________________ Laurice Record, M.D. LIO/MEDQ  D:  12/29/2011  T:  12/29/2011  Job:  [id number]

## 2011-12-29 NOTE — Progress Notes (Signed)
This office note has been dictated.

## 2011-12-30 ENCOUNTER — Encounter (HOSPITAL_COMMUNITY): Payer: Self-pay | Admitting: *Deleted

## 2011-12-30 ENCOUNTER — Inpatient Hospital Stay (HOSPITAL_COMMUNITY)
Admission: AD | Admit: 2011-12-30 | Discharge: 2011-12-31 | DRG: 775 | Disposition: A | Discharge status: Home / Self Care | Payer: Medicaid Other | Source: Ambulatory Visit | Attending: Obstetrics & Gynecology | Admitting: Obstetrics & Gynecology

## 2011-12-30 DIAGNOSIS — O364XX0 Maternal care for intrauterine death, not applicable or unspecified: Principal | ICD-10-CM | POA: Diagnosis present

## 2011-12-30 DIAGNOSIS — O99814 Abnormal glucose complicating childbirth: Secondary | ICD-10-CM | POA: Diagnosis present

## 2011-12-30 DIAGNOSIS — D689 Coagulation defect, unspecified: Secondary | ICD-10-CM | POA: Diagnosis present

## 2011-12-30 DIAGNOSIS — D696 Thrombocytopenia, unspecified: Secondary | ICD-10-CM | POA: Diagnosis present

## 2011-12-30 LAB — CBC
HCT: 40.1 % (ref 36.0–46.0)
Hemoglobin: 13.1 g/dL (ref 12.0–15.0)
Hemoglobin: 11.8 g/dL — ABNORMAL LOW (ref 12.0–15.0)
Platelets: 122 10*3/uL — ABNORMAL LOW (ref 150–400)
RBC: 4.09 MIL/uL (ref 3.87–5.11)
WBC: 10.8 10*3/uL — ABNORMAL HIGH (ref 4.0–10.5)
WBC: 16.3 10*3/uL — ABNORMAL HIGH (ref 4.0–10.5)

## 2011-12-30 LAB — COMPREHENSIVE METABOLIC PANEL
AST: 12 U/L (ref 0–37)
Albumin: 2.5 g/dL — ABNORMAL LOW (ref 3.5–5.2)
Alkaline Phosphatase: 86 U/L (ref 39–117)
Chloride: 99 mEq/L (ref 96–112)
Potassium: 3.9 mEq/L (ref 3.5–5.1)
Sodium: 134 mEq/L — ABNORMAL LOW (ref 135–145)
Total Bilirubin: 0.5 mg/dL (ref 0.3–1.2)
Total Protein: 6.1 g/dL (ref 6.0–8.3)

## 2011-12-30 MED ORDER — BUTORPHANOL TARTRATE 1 MG/ML IJ SOLN
1.0000 mg | Freq: Once | INTRAMUSCULAR | Status: AC
  Administered 2011-12-30: 1 mg via INTRAVENOUS
  Filled 2011-12-30: qty 1

## 2011-12-30 MED ORDER — TETANUS-DIPHTH-ACELL PERTUSSIS 5-2.5-18.5 LF-MCG/0.5 IM SUSP: 0.5000 mL | Freq: Once | INTRAMUSCULAR | Status: DC

## 2011-12-30 MED ORDER — ONDANSETRON HCL 4 MG PO TABS
4.0000 mg | ORAL_TABLET | ORAL | Status: DC | PRN
  Administered 2011-12-31: 4 mg via ORAL
  Filled 2011-12-30: qty 1

## 2011-12-30 MED ORDER — ZOLPIDEM TARTRATE 5 MG PO TABS
5.0000 mg | ORAL_TABLET | Freq: Every evening | ORAL | Status: DC | PRN
  Administered 2011-12-30: 5 mg via ORAL
  Filled 2011-12-30: qty 1

## 2011-12-30 MED ORDER — GLYBURIDE 5 MG PO TABS
5.0000 mg | ORAL_TABLET | Freq: Two times a day (BID) | ORAL | Status: DC
  Administered 2011-12-30 – 2011-12-31 (×2): 5 mg via ORAL
  Filled 2011-12-30 (×4): qty 1

## 2011-12-30 MED ORDER — LIDOCAINE HCL (PF) 1 % IJ SOLN
30.0000 mL | INTRAMUSCULAR | Status: DC | PRN
  Filled 2011-12-30: qty 30

## 2011-12-30 MED ORDER — BUTORPHANOL TARTRATE 1 MG/ML IJ SOLN
1.0000 mg | Freq: Once | INTRAMUSCULAR | Status: AC
  Administered 2011-12-30: 1 mg via INTRAVENOUS

## 2011-12-30 MED ORDER — ONDANSETRON HCL 4 MG/2ML IJ SOLN: 4.0000 mg | INTRAMUSCULAR | Status: DC | PRN

## 2011-12-30 MED ORDER — SIMETHICONE 80 MG PO CHEW: 80.0000 mg | CHEWABLE_TABLET | ORAL | Status: DC | PRN

## 2011-12-30 MED ORDER — BENZOCAINE-MENTHOL 20-0.5 % EX AERO: 1.0000 "application " | INHALATION_SPRAY | CUTANEOUS | Status: DC | PRN

## 2011-12-30 MED ORDER — IBUPROFEN 600 MG PO TABS
600.0000 mg | ORAL_TABLET | Freq: Four times a day (QID) | ORAL | Status: DC | PRN
  Administered 2011-12-30: 600 mg via ORAL
  Filled 2011-12-30: qty 1

## 2011-12-30 MED ORDER — LACTATED RINGERS IV SOLN: 500.0000 mL | INTRAVENOUS | Status: DC | PRN

## 2011-12-30 MED ORDER — FLEET ENEMA 7-19 GM/118ML RE ENEM: 1.0000 | ENEMA | RECTAL | Status: DC | PRN

## 2011-12-30 MED ORDER — OXYCODONE-ACETAMINOPHEN 5-325 MG PO TABS
1.0000 | ORAL_TABLET | ORAL | Status: DC | PRN
  Administered 2011-12-30: 1 via ORAL
  Filled 2011-12-30: qty 1

## 2011-12-30 MED ORDER — ACETAMINOPHEN 325 MG PO TABS: 650.0000 mg | ORAL_TABLET | ORAL | Status: DC | PRN

## 2011-12-30 MED ORDER — SENNOSIDES-DOCUSATE SODIUM 8.6-50 MG PO TABS
2.0000 | ORAL_TABLET | Freq: Every day | ORAL | Status: DC
  Administered 2011-12-30: 2 via ORAL

## 2011-12-30 MED ORDER — OXYTOCIN 40 UNITS IN LACTATED RINGERS INFUSION - SIMPLE MED
62.5000 mL/h | Freq: Once | INTRAVENOUS | Status: DC
  Filled 2011-12-30: qty 1000

## 2011-12-30 MED ORDER — PRENATAL MULTIVITAMIN CH: 1.0000 | ORAL_TABLET | Freq: Every day | ORAL | Status: DC

## 2011-12-30 MED ORDER — BUTORPHANOL TARTRATE 1 MG/ML IJ SOLN
1.0000 mg | INTRAMUSCULAR | Status: DC | PRN
  Administered 2011-12-30: 1 mg via INTRAVENOUS
  Filled 2011-12-30 (×2): qty 1

## 2011-12-30 MED ORDER — WITCH HAZEL-GLYCERIN EX PADS: 1.0000 "application " | MEDICATED_PAD | CUTANEOUS | Status: DC | PRN

## 2011-12-30 MED ORDER — DIPHENHYDRAMINE HCL 25 MG PO CAPS: 25.0000 mg | ORAL_CAPSULE | Freq: Four times a day (QID) | ORAL | Status: DC | PRN

## 2011-12-30 MED ORDER — LACTATED RINGERS IV SOLN
INTRAVENOUS | Status: DC
  Administered 2011-12-30: 13:00:00 via INTRAVENOUS

## 2011-12-30 MED ORDER — HYDROCORTISONE SOD SUCCINATE 100 MG IJ SOLR
20.0000 mg | Freq: Three times a day (TID) | INTRAMUSCULAR | Status: DC
  Administered 2011-12-30 – 2011-12-31 (×2): 20 mg via INTRAVENOUS
  Filled 2011-12-30 (×3): qty 0.4

## 2011-12-30 MED ORDER — PREDNISONE 10 MG PO TABS
30.0000 mg | ORAL_TABLET | Freq: Two times a day (BID) | ORAL | Status: DC
  Administered 2011-12-30 – 2011-12-31 (×2): 30 mg via ORAL
  Filled 2011-12-30 (×4): qty 1

## 2011-12-30 MED ORDER — OXYCODONE-ACETAMINOPHEN 5-325 MG PO TABS
1.0000 | ORAL_TABLET | ORAL | Status: DC | PRN
  Administered 2011-12-30: 2 via ORAL
  Administered 2011-12-30: 1 via ORAL
  Administered 2011-12-31 (×2): 2 via ORAL
  Filled 2011-12-30 (×2): qty 2
  Filled 2011-12-30: qty 1
  Filled 2011-12-30: qty 2

## 2011-12-30 MED ORDER — IBUPROFEN 600 MG PO TABS
600.0000 mg | ORAL_TABLET | Freq: Four times a day (QID) | ORAL | Status: DC
  Administered 2011-12-31 (×2): 600 mg via ORAL
  Filled 2011-12-30 (×2): qty 1

## 2011-12-30 MED ORDER — DIBUCAINE 1 % RE OINT: 1.0000 "application " | TOPICAL_OINTMENT | RECTAL | Status: DC | PRN

## 2011-12-30 MED ORDER — HYDROCORTISONE SOD SUCCINATE 100 MG IJ SOLR
50.0000 mg | Freq: Once | INTRAMUSCULAR | Status: AC
  Administered 2011-12-30: 50 mg via INTRAVENOUS
  Filled 2011-12-30: qty 1

## 2011-12-30 MED ORDER — CITRIC ACID-SODIUM CITRATE 334-500 MG/5ML PO SOLN: 30.0000 mL | ORAL | Status: DC | PRN

## 2011-12-30 MED ORDER — OXYTOCIN BOLUS FROM INFUSION
250.0000 mL | Freq: Once | INTRAVENOUS | Status: AC
  Administered 2011-12-30: 250 mL via INTRAVENOUS
  Filled 2011-12-30: qty 500

## 2011-12-30 MED ORDER — LANOLIN HYDROUS EX OINT: TOPICAL_OINTMENT | CUTANEOUS | Status: DC | PRN

## 2011-12-30 MED ORDER — ONDANSETRON HCL 4 MG/2ML IJ SOLN: 4.0000 mg | Freq: Four times a day (QID) | INTRAMUSCULAR | Status: DC | PRN

## 2011-12-30 MED ORDER — PROMETHAZINE HCL 25 MG/ML IJ SOLN
25.0000 mg | Freq: Four times a day (QID) | INTRAMUSCULAR | Status: DC | PRN
  Administered 2011-12-30: 25 mg via INTRAVENOUS
  Filled 2011-12-30: qty 1

## 2011-12-30 NOTE — H&P (Signed)
Shelly Andrews is a 31 y.o. female presenting for labor.  Patient was seen in MAU last night with c/o decreased fetal movement.  IUFD was documented by ultrasound x 2.  The plan was made for her to return at [facility] today for induction.  The patient presented today with labor.  She denies vaginal bleeding.  Reports increasingly painful contractions.  Desires full workup to investigate cause of demise.  Maternal Medical History:  Reason for admission: Reason for admission: contractions.  Contractions: Onset was 6-12 hours ago.   Frequency: regular.   Duration is approximately 1 minute.   Perceived severity is moderate.    Fetal activity: Perceived fetal activity is none.   Last perceived fetal movement was greater than 24 hours ago.    Prenatal complications: Preterm labor and thrombocytopenia.   Prenatal Complications - Diabetes: gestational. Diabetes is managed by oral agent (monotherapy).      OB History    Grav Para Term Preterm Abortions TAB SAB Ect Mult Living   3 2 1 1  0 0 0 0 0 2     Past Medical History  Diagnosis Date  . Thrombocytopenia     Idopathic with pregnancy  . No pertinent past medical history   . Pregnancy   . Gestational diabetes    Past Surgical History  Procedure Date  . No past surgeries    Family History: family history is not on file. Social History:  reports that she has never smoked. She has never used smokeless tobacco. She reports that she does not drink alcohol or use illicit drugs.   Prenatal Transfer Tool  Maternal Diabetes: Yes:  Diabetes Type:  Insulin/Medication controlled Genetic Screening: Normal Maternal Ultrasounds/Referrals: Abnormal:  Findings:   Fetal renal pyelectasis Fetal Ultrasounds or other Referrals:  None Maternal Substance Abuse:  No Significant Maternal Medications:  Meds include: Other: Glyburide 5mg  BID, Prednisone 30mg  BID. Significant Maternal Lab Results:  Lab values include: Other: PLT 92, fibrinogen 508 last  pm Other Comments:  None  Review of Systems  Constitutional: Negative.   Skin: Negative.   Endo/Heme/Allergies: Negative.     Dilation: 5 Effacement (%): 90 Station: -2 Exam by:: Yostin Malacara Blood pressure 127/73, pulse 110, temperature 97.8 F (36.6 C), temperature source Oral, resp. rate 20, height 5\' 3"  (1.6 m), weight 63.957 kg (141 lb), last menstrual period 05/22/2011. Maternal Exam:  Uterine Assessment: Contraction strength is moderate.  Contraction duration is 1 minute. Contraction frequency is regular.   Abdomen: Patient reports no abdominal tenderness. Fundal height is c/w dates.   Estimated fetal weight is 4,6.   Fetal presentation: vertex  Introitus: Normal vulva. Ferning test: not done.  Nitrazine test: not done.  Pelvis: adequate for delivery.   Cervix: Cervix evaluated by digital exam.     Physical Exam  Constitutional: She is oriented to person, place, and time.  HENT:  Head: Atraumatic.  GI: Soft. There is no rebound and no guarding.  Genitourinary: Vagina normal and uterus normal.  Neurological: She is alert and oriented to person, place, and time.  Skin: Skin is warm and dry.    Prenatal labs: ABO, Rh: --/--/O NEG (03/12 1545) Antibody: NEG (03/12 1545) Rubella: Immune (02/28 0000) RPR: Nonreactive (02/28 0000)  HBsAg: Negative (02/28 0000)  HIV: Non-reactive (02/28 0000)  GBS:     Assessment/Plan: 16XW [id number] with IUFD at 31wks.   Will draw labs to workup potential cause of demise.   Will send placenta for pathology. Patient to decide if  she would like autopsy. Will consult chaplain. ITP: Will continue Pred 30 BID and add stress dose steroids x 24 hours.  A2DM: Glycemic goal <200.  SSI prn. Pain control with IV meds.   Shelly Andrews 12/30/2011, 1:42 PM

## 2011-12-30 NOTE — Progress Notes (Signed)
Chaplain Note: Was on the way to see Anjoli, as this Chaplain had seen her yesterday in admissions.  The page was viewed at that time, but had come at 6:01pm.   She recognized the Chaplain and broke into tears when he entered the room.  Chaplain got there about 6:30, after a brief session with the nurse. She had delivered her dead baby at 3:10pm in the afternoon.  We talked about her feelings, her other two children, and she shared that the father and her are not married, and that he might have been "less than mournful" at the loss.  Their relationship is rocky, and the closeness seen yesterday was between her and her daughters, and in how she was rearing them.  She talked about her daughter.  Her grandmother was the one who raised her since 38 days old.  We talked about what she has shown already as strength.  She said the doctor from the group practice who saw her Tuesday, when there was a vibrant heartbeat, was the same one who delivered the baby today.  We talked about how that was "so unusual" while being comforting, that it speaks to God's presence in this tragedy.  She shared her funeral plans.  She is slated to go home on Sunday (tomorrow), 8/25.  We talked for about 40 minutes.  Rema Jasmine, Chaplain [phone]

## 2011-12-30 NOTE — Op Note (Signed)
SVD of NVMI. Head delivered ROA with body following atraumatically. No nuchal cord. Cord clamped, cut and baby to abdomen.  Placenta delivered S/I/3VC.  Fundus firmed with pitocin and massage.  Perineum without laceration.  Mom stable with mother and grandmother in support.    Clear amniotic fluid noted.  Baby with mild-moderate desquamation noted.  Plethoric appearing and larger than expected size.  No gross malformation.  Normal appearing placenta with centrally implanted cord; 3 vessels.  No adherent clot on placenta.  Placenta will be sent to pathology.    Pt deciding if she desires autopsy as baby will need to go to morgue within 2 hours of delivery.

## 2011-12-31 ENCOUNTER — Encounter (HOSPITAL_COMMUNITY): Payer: Self-pay | Admitting: *Deleted

## 2011-12-31 LAB — CBC
HCT: 35.3 % — ABNORMAL LOW (ref 36.0–46.0)
HCT: 33.3 % — ABNORMAL LOW (ref 36.0–46.0)
Hemoglobin: 11.5 g/dL — ABNORMAL LOW (ref 12.0–15.0)
Hemoglobin: 11 g/dL — ABNORMAL LOW (ref 12.0–15.0)
MCH: 29.6 pg (ref 26.0–34.0)
MCV: 89.8 fL (ref 78.0–100.0)
MCV: 89.5 fL (ref 78.0–100.0)
Platelets: 111 10*3/uL — ABNORMAL LOW (ref 150–400)
RBC: 3.72 MIL/uL — ABNORMAL LOW (ref 3.87–5.11)
WBC: 15.1 10*3/uL — ABNORMAL HIGH (ref 4.0–10.5)
WBC: 12.5 10*3/uL — ABNORMAL HIGH (ref 4.0–10.5)

## 2011-12-31 LAB — HEMOGLOBIN A1C
Hgb A1c MFr Bld: 6.1 % — ABNORMAL HIGH (ref <5.7)
Mean Plasma Glucose: 128 mg/dL — ABNORMAL HIGH (ref <117)

## 2011-12-31 MED ORDER — IBUPROFEN 200 MG PO TABS: 600.0000 mg | ORAL_TABLET | Freq: Four times a day (QID) | ORAL | Status: AC | PRN

## 2011-12-31 MED ORDER — OXYCODONE-ACETAMINOPHEN 5-325 MG PO TABS: 1.0000 | ORAL_TABLET | ORAL | Status: AC | PRN

## 2011-12-31 MED ORDER — KETOROLAC TROMETHAMINE 10 MG PO TABS
10.0000 mg | ORAL_TABLET | Freq: Four times a day (QID) | ORAL | Status: DC
  Administered 2011-12-31: 10 mg via ORAL
  Filled 2011-12-31 (×5): qty 1

## 2011-12-31 MED ORDER — ZOLPIDEM TARTRATE 5 MG PO TABS: 5.0000 mg | ORAL_TABLET | Freq: Every evening | ORAL | Status: DC | PRN

## 2011-12-31 MED ORDER — SERTRALINE HCL 25 MG PO TABS: 25.0000 mg | ORAL_TABLET | Freq: Every day | ORAL | Status: DC

## 2011-12-31 MED ORDER — SERTRALINE HCL 25 MG PO TABS
25.0000 mg | ORAL_TABLET | Freq: Every day | ORAL | Status: DC
  Administered 2011-12-31: 25 mg via ORAL
  Filled 2011-12-31 (×2): qty 1

## 2011-12-31 MED ORDER — RHO D IMMUNE GLOBULIN 1500 UNIT/2ML IJ SOLN
300.0000 ug | Freq: Once | INTRAMUSCULAR | Status: AC
  Administered 2011-12-31: 300 ug via INTRAVENOUS
  Filled 2011-12-31: qty 2

## 2011-12-31 MED ORDER — INSULIN ASPART 100 UNIT/ML ~~LOC~~ SOLN
10.0000 [IU] | Freq: Once | SUBCUTANEOUS | Status: AC
  Administered 2011-12-31: 10 [IU] via SUBCUTANEOUS

## 2011-12-31 NOTE — Progress Notes (Signed)
Post Partum Day 1 Subjective: up ad lib, voiding, tolerating PO, + flatus and c/o crampy abdominal pain.  Has good family support and has been visited by chaplain.  Requests antidepressant.   Objective: Blood pressure 108/79, pulse 86, temperature 97.7 F (36.5 C), temperature source Oral, resp. rate 16, height 5\' 3"  (1.6 m), weight 63.957 kg (141 lb), last menstrual period 05/22/2011, SpO2 97.00%.  Physical Exam:  General: alert, cooperative and appears stated age Lochia: appropriate Uterine Fundus: firm Incision: N/A DVT Evaluation: No evidence of DVT seen on physical exam. Negative Homan's sign. No cords or calf tenderness. No significant calf/ankle edema.   Basename 12/31/11 0702 12/31/11 0057  HGB 11.0* 11.5*  HCT 33.3* 35.3*    Assessment/Plan: Discharge home.   IUFD: Full work-up in process.  Autopsy to be performed with patient planning for cremation.  RTO in 2 weeks for review of results, discussion of contraception, and postpartum visit.  Offered psych evaluation if needed.  Zoloft to be started.  Ambien Rx.   A2DM: Discharged home with diabetic diet.  Continue Glyburide 5mg  BID with continued monitoring.  Will need referral to medicine for glycemic control outside of pregnancy. ITP: PLT already scheduled to be drawn weekly.  Patient encouraged to call Hematologist next week and inform her of IUFD and to arrange follow-up.  Continue prednisone.  S/P stress dose steroids.   Rh neg: Order placed for rhogam prior to discharge.    LOS: 1 day   Lex Linhares 12/31/2011, 9:59 AM

## 2011-12-31 NOTE — Discharge Summary (Signed)
Obstetric Discharge Summary Reason for Admission: onset of labor and IUFD Prenatal Procedures: ultrasound Intrapartum Procedures: spontaneous vaginal delivery Postpartum Procedures: Rho(D) Ig Complications-Operative and Postpartum: none HGB  Date Value Range Status  12/29/2011 12.2  11.6 - 15.9 g/dL Final     Hemoglobin  Date Value Range Status  12/31/2011 11.0* 12.0 - 15.0 g/dL Final     HCT  Date Value Range Status  12/31/2011 33.3* 36.0 - 46.0 % Final  12/29/2011 36.2  34.8 - 46.6 % Final    Physical Exam:  General: alert, cooperative and appears stated age Lochia: appropriate Uterine Fundus: firm Incision: N/A DVT Evaluation: No evidence of DVT seen on physical exam. Negative Homan's sign. No cords or calf tenderness.  Discharge Diagnoses: IUFD; delivered  Discharge Information: Date: 12/31/2011 Activity: unrestricted and pelvic rest Diet: Diabetic Medications: PNV, Ibuprofen, Percocet and Zoloft, Glyburide, Prednisone, Ambien Condition: stable Instructions: refer to practice specific booklet Discharge to: home   Newborn Data: Nonviable female  Birth Weight: 6 lb 3.5 oz (2820 g) APGAR: 0, 0  To morgue for autopsy per patient's request.  Shelly Andrews 12/31/2011, 9:52 AM

## 2011-12-31 NOTE — Plan of Care (Signed)
Problem: Consults Goal: Postpartum Patient Education (See Patient Education module for education specifics.) Outcome: Progressing Breast care dicussed with patient Goal: Diabetes Guidelines if Diabetic/Glucose > 140 If diabetic or lab glucose is > 140 mg/dl - Initiate Diabetes/Hyperglycemia Guidelines & Document Interventions  Outcome: Progressing CBG's are ordered every 4 hours and the MD is controlling results.Patient is also on steroids.MD made aware of elevated cbg 391 and Glyburide 5 mg was given this hs  Problem: Phase I Progression Outcomes Goal: Pain controlled with appropriate interventions Outcome: Completed/Met Date Met:  12/30/11 Good pain control with po Percocet but needs to take 2 at once because 1 does not last long enough. Goal: Voiding adequately Outcome: Completed/Met Date Met:  12/30/11 Has voided large amounts x 2 since admission Goal: OOB as tolerated unless otherwise ordered Outcome: Completed/Met Date Met:  12/30/11 Escorted patient to the bathroom x 2 and she is stable on her feet without dizziness. Goal: Initial discharge plan identified Outcome: Completed/Met Date Met:  12/31/11 Understands self care Controlled blood sugar Stable platelet count Knows when to call MD Continued comfort care Goal: Other Phase I Outcomes/Goals Outcome: Not Progressing Stable glucose  Problem: Phase II Progression Outcomes Goal: Rh isoimmunization per orders Outcome: Progressing For draw with next CBC

## 2011-12-31 NOTE — Progress Notes (Signed)
Pt discharged to home with mother.  Condition stable.  Pt to car via wheelchair with Kathleen Argue, RN.  No equipment for home ordered at discharge.

## 2012-01-01 ENCOUNTER — Telehealth: Payer: Self-pay | Admitting: *Deleted

## 2012-01-01 LAB — LUPUS ANTICOAGULANT PANEL: DRVVT: 37.1 secs (ref <45.1)

## 2012-01-01 LAB — RH IG WORKUP (INCLUDES ABO/RH)
ABO/RH(D): O NEG
Antibody Screen: POSITIVE
Fetal Screen: NEGATIVE

## 2012-01-01 LAB — TYPE AND SCREEN
ABO/RH(D): O NEG
Antibody Screen: POSITIVE
Unit division: 0

## 2012-01-01 LAB — BETA-2-GLYCOPROTEIN I ABS, IGG/M/A
Beta-2 Glyco I IgG: 0 G Units (ref <20)
Beta-2-Glycoprotein I IgA: 4 A Units (ref <20)
Beta-2-Glycoprotein I IgM: 1 M Units (ref <20)

## 2012-01-01 NOTE — Telephone Encounter (Signed)
Spoke with pt today.   Pt informed nurse that she was in the hospital due to no movement from the baby was felt on Fri evening.   Pt was tearful and informed nurse that she had lost the baby.  Pt wanted to know when she can start weaning off of Prednisone.   Informed pt that nurse will call pt back on 01/02/12 with md's decision.    Pt voiced understanding. Pt's  Phone    [phone].

## 2012-01-02 ENCOUNTER — Telehealth: Payer: Self-pay

## 2012-01-02 LAB — URINE DRUGS OF ABUSE SCREEN W ALC, ROUTINE (REF LAB)
Amphetamine Screen, Ur: NEGATIVE
Barbiturate Quant, Ur: NEGATIVE
Creatinine,U: 45.2 mg/dL
Ethyl Alcohol: <10 mg/dL (ref <10)
Phencyclidine (PCP): NEGATIVE

## 2012-01-02 NOTE — Progress Notes (Signed)
Post-discharge UR chart review completed. 

## 2012-01-02 NOTE — Telephone Encounter (Signed)
Open by error

## 2012-01-02 NOTE — Progress Notes (Signed)
Dr Dalene Carrow s/w pt and said pt will decrease prednisone to 20mg  po bid and will keep cbc lab appt as scheduled 8/30.

## 2012-01-03 LAB — PARVOVIRUS B19 ANTIBODY, IGG AND IGM
Parovirus B19 IgG Abs: 0.2 index (ref <0.9)
Parovirus B19 IgM Abs: 0.1 index (ref <0.9)

## 2012-01-05 ENCOUNTER — Other Ambulatory Visit: Payer: Medicaid Other | Admitting: Lab

## 2012-01-07 DEATH — deceased

## 2012-01-12 ENCOUNTER — Telehealth: Payer: Self-pay | Admitting: *Deleted

## 2012-01-12 ENCOUNTER — Other Ambulatory Visit (HOSPITAL_BASED_OUTPATIENT_CLINIC_OR_DEPARTMENT_OTHER): Payer: Medicaid Other | Admitting: Lab

## 2012-01-12 DIAGNOSIS — D693 Immune thrombocytopenic purpura: Secondary | ICD-10-CM

## 2012-01-12 LAB — CBC WITH DIFFERENTIAL/PLATELET
Basophils Absolute: 0 10*3/uL (ref 0.0–0.1)
Eosinophils Absolute: 0 10*3/uL (ref 0.0–0.5)
HCT: 37.7 % (ref 34.8–46.6)
LYMPH%: 16.2 % (ref 14.0–49.7)
MCV: 89.8 fL (ref 79.5–101.0)
MONO#: 0.5 10*3/uL (ref 0.1–0.9)
MONO%: 4.7 % (ref 0.0–14.0)
NEUT#: 7.5 10*3/uL — ABNORMAL HIGH (ref 1.5–6.5)
NEUT%: 78.8 % — ABNORMAL HIGH (ref 38.4–76.8)
Platelets: 107 10*3/uL — ABNORMAL LOW (ref 145–400)
RBC: 4.2 10*6/uL (ref 3.70–5.45)
WBC: 9.5 10*3/uL (ref 3.9–10.3)

## 2012-01-12 NOTE — Telephone Encounter (Signed)
Spoke with pt and instructed pt re:  Platelet ct  Up to  107.    Instructed pt to decrease  Prednisone  To  10 mg  BID   As per md's instructions.   Confirmed next lab on 01/19/12.   Pt voiced understanding.

## 2012-01-12 NOTE — Telephone Encounter (Signed)
Called pt on cell phone and left message on voice mail re:  New appts scheduled for 01/19/12  At 1230 pm lab , and  1 pm  F/U with NP.   Asked pt to call office back on Mon to confirm that she received nurse message.

## 2012-01-17 ENCOUNTER — Telehealth: Payer: Self-pay | Admitting: *Deleted

## 2012-01-17 NOTE — Telephone Encounter (Signed)
Refill request for prednisone received from [facility]  Request to provider in box for review.

## 2012-01-18 ENCOUNTER — Other Ambulatory Visit: Payer: Self-pay | Admitting: Nurse Practitioner

## 2012-01-18 DIAGNOSIS — D696 Thrombocytopenia, unspecified: Secondary | ICD-10-CM

## 2012-01-18 MED ORDER — PREDNISONE 5 MG PO TABS: ORAL_TABLET | ORAL | Status: DC

## 2012-01-19 ENCOUNTER — Other Ambulatory Visit (HOSPITAL_BASED_OUTPATIENT_CLINIC_OR_DEPARTMENT_OTHER): Payer: Medicaid Other | Admitting: Lab

## 2012-01-19 ENCOUNTER — Other Ambulatory Visit: Payer: Medicaid Other | Admitting: Lab

## 2012-01-19 ENCOUNTER — Ambulatory Visit (HOSPITAL_BASED_OUTPATIENT_CLINIC_OR_DEPARTMENT_OTHER): Payer: Medicaid Other | Admitting: Family

## 2012-01-19 ENCOUNTER — Ambulatory Visit (HOSPITAL_COMMUNITY)
Admission: RE | Admit: 2012-01-19 | Discharge: 2012-01-19 | Disposition: A | Discharge status: Home / Self Care | Payer: Medicaid Other | Source: Ambulatory Visit | Attending: Family | Admitting: Family

## 2012-01-19 ENCOUNTER — Ambulatory Visit (HOSPITAL_BASED_OUTPATIENT_CLINIC_OR_DEPARTMENT_OTHER): Payer: Medicaid Other

## 2012-01-19 ENCOUNTER — Encounter: Payer: Self-pay | Admitting: Family

## 2012-01-19 VITALS — BP 124/81 | HR 99 | Temp 98.4°F | Resp 20 | Ht 63.0 in | Wt 128.3 lb

## 2012-01-19 DIAGNOSIS — D693 Immune thrombocytopenic purpura: Secondary | ICD-10-CM

## 2012-01-19 DIAGNOSIS — D689 Coagulation defect, unspecified: Secondary | ICD-10-CM

## 2012-01-19 LAB — CBC WITH DIFFERENTIAL/PLATELET
BASO%: 0.2 % (ref 0.0–2.0)
BASO%: 0.5 % (ref 0.0–2.0)
EOS%: 0.3 % (ref 0.0–7.0)
HCT: 39.4 % (ref 34.8–46.6)
HCT: 40.7 % (ref 34.8–46.6)
LYMPH%: 20.2 % (ref 14.0–49.7)
LYMPH%: 13.7 % — ABNORMAL LOW (ref 14.0–49.7)
MCH: 28.8 pg (ref 25.1–34.0)
MCH: 29.4 pg (ref 25.1–34.0)
MCHC: 31.7 g/dL (ref 31.5–36.0)
MCHC: 32.6 g/dL (ref 31.5–36.0)
MCV: 90.8 fL (ref 79.5–101.0)
MONO#: 0.3 10*3/uL (ref 0.1–0.9)
MONO#: 0.4 10*3/uL (ref 0.1–0.9)
MONO%: 4.6 % (ref 0.0–14.0)
NEUT%: 74.3 % (ref 38.4–76.8)
NEUT%: 79.4 % — ABNORMAL HIGH (ref 38.4–76.8)
Platelets: 110 10*3/uL — ABNORMAL LOW (ref 145–400)
RBC: 4.34 10*6/uL (ref 3.70–5.45)
RBC: 4.51 10*6/uL (ref 3.70–5.45)
WBC: 6.8 10*3/uL (ref 3.9–10.3)
lymph#: 0.9 10*3/uL (ref 0.9–3.3)

## 2012-01-19 LAB — LACTATE DEHYDROGENASE (CC13): LDH: 278 U/L — ABNORMAL HIGH (ref 125–220)

## 2012-01-19 LAB — TSH: TSH: 0.239 u[IU]/mL — ABNORMAL LOW (ref 0.350–4.500)

## 2012-01-19 LAB — MORPHOLOGY: PLT EST: DECREASED

## 2012-01-19 LAB — T4, FREE: Free T4: 1.61 ng/dL (ref 0.80–1.80)

## 2012-01-19 NOTE — Progress Notes (Signed)
Patient ID: Shelly Andrews, female   DOB: [birth year], 31 y.o.   MRN: [id number] CSN: [id number]  CC: [name], MD  Identifying Statement: Shelly Andrews is a 31 y.o. Caucasian female with a history of ITP who presents for follow-up.  Interval History: [name] is a 31 year-old Caucasian woman with ITP who presents for follow-up.  She was last seen by Dr. Dalene Carrow in the office on 12/31/2011.  Since that time the patient, who was [redacted] weeks pregnant with a history gestational thrombocytopenia/ITP, suffered an unfortunate stillbirth of a baby boy and is still grieving.  The patient is tearful during today's office visit.  Despite her grief, the patient states that she physically feels well and is without major complaint.  She states that she has not had any unusual bleeding/bruising, calf pain, other pain, no fever or chills, no N/V/D, no constipation or any other symptomatology.  Shelly Andrews states that she has occasional night sweats.  The patient has not followed up with her PCP since the stillbirth, but has been seen by her OB/GYN.  The patient is interested in receiving grief counseling and was visited by Su Ley today to review available grief counseling resources.  Medications: Current Outpatient Prescriptions  Medication Sig Dispense Refill  . glucose blood test strip Use as instructed  100 each  1  . pantoprazole (PROTONIX) 20 MG tablet Take 20 mg by mouth as needed.       . predniSONE (DELTASONE) 5 MG tablet Take as directed by physician.  60 tablet  0  . sertraline (ZOLOFT) 25 MG tablet Take 1 tablet (25 mg total) by mouth daily.  20 tablet  3  . zolpidem (AMBIEN) 5 MG tablet Take 1 tablet (5 mg total) by mouth at bedtime as needed for sleep (insomnia.).  30 tablet  0    Allergies: Allergies  Allergen Reactions  . Sulfa Antibiotics Hives    Past Medical History: Past Medical History  Diagnosis Date  . Thrombocytopenia     Idopathic with pregnancy  . No pertinent past  medical history   . Pregnancy   . Gestational diabetes      Family History: Family History  Problem Relation Age of Onset  . Diabetes Mother   . Diabetes Maternal Grandmother     Social History: History  Substance Use Topics  . Smoking status: Never Smoker   . Smokeless tobacco: Never Used  . Alcohol Use: No     1-2 on weekend    Review of Systems: 10 point review of systems was completed and is negative except as noted above.   Physical Exam: Blood pressure 124/81, pulse 99, temperature 98.4 F (36.9 C), temperature source Oral, resp. rate 20, height 5\' 3"  (1.6 m), weight 128 lb 4.8 oz (58.196 kg).  General appearance: Alert, cooperative, tearful, mild distress Head: Normocephalic, without obvious abnormality, atraumatic Eyes: Conjunctivae/corneas clear, PERRLA, EOMI Nose: Nares, septum and mucosa are normal, no drainage or sinus tenderness Neck: No adenopathy, supple, symmetrical, trachea midline, thyroid not enlarged, no tenderness Resp: Clear to auscultation bilaterally Cardio: Regular rate and rhythm, S1, S2 normal, no murmur, click, rub or gallop GI: Soft, distended, non-tender, hypoactive bowel sounds Extremities: Extremities normal, atraumatic, no cyanosis or edema Neurologic: Grossly normal   Laboratory Data: Results for orders placed in visit on 01/19/12 (from the past 48 hour(s))  CBC WITH DIFFERENTIAL     Status: Abnormal   Collection Time   01/19/12 12:27 PM  Component Value Range Comment   WBC 5.9  3.9 - 10.3 10e3/uL    NEUT# 4.4  1.5 - 6.5 10e3/uL    HGB 12.5  11.6 - 15.9 g/dL    HCT 16.1  09.6 - 04.5 %    Platelets 110 (*) 145 - 400 10e3/uL    MCV 90.8  79.5 - 101.0 fL    MCH 28.8  25.1 - 34.0 pg    MCHC 31.7  31.5 - 36.0 g/dL    RBC 4.09  8.11 - 9.14 10e6/uL    RDW 14.8 (*) 11.2 - 14.5 %    lymph# 1.2  0.9 - 3.3 10e3/uL    MONO# 0.3  0.1 - 0.9 10e3/uL    Eosinophils Absolute 0.0  0.0 - 0.5 10e3/uL    Basophils Absolute 0.0  0.0 - 0.1  10e3/uL    NEUT% 74.3  38.4 - 76.8 %    LYMPH% 20.2  14.0 - 49.7 %    MONO% 4.6  0.0 - 14.0 %    EOS% 0.7  0.0 - 7.0 %    BASO% 0.2  0.0 - 2.0 %     Impression/Plan: Shelly Andrews is a 31 year-old Caucasian female with a history of pregnancy induced thrombocytopenia/ITP.  The patient is s/p 2 doses of IVIG and placed on Prednisone.  Following a recent stillbirth, the patient began a Prednisone taper.  She will remain on Prednisone 5 mg PO daily.   Platelets continue to remain above 100,000.  She has no evidence of bleeding.  We will obtain additional work-up to include TSH, Vitamin B 12, LDH and Morphology laboratories to be drawn today. A complete abdominal US has been ordered to evaluate her spleen.  Ms. Pacione will  return in 2 weeks' time to have a CBC drawn.  The patient also return in 4 week's time for an office visit with Dr. Dalene Carrow and a CBC.  The patient states that she will continue to follow up with her PCP.   Larina Bras, NP-C 01/19/2012, 12:56 PM

## 2012-01-19 NOTE — Patient Instructions (Signed)
Avoid prolonged stress.  Please contact Shelly Andrews when you need to.

## 2012-01-22 ENCOUNTER — Encounter: Payer: Self-pay | Admitting: *Deleted

## 2012-01-22 ENCOUNTER — Telehealth: Payer: Self-pay | Admitting: Oncology

## 2012-01-22 NOTE — Telephone Encounter (Signed)
Pt sent for US/lb on 9/13 and given schedule for September/October. F/u scheduled for 10/15 due to LO out 10/16.

## 2012-01-22 NOTE — Progress Notes (Signed)
[facility] Brief Psychosocial Assessment Clinical Social Work  Clinical Social Work was referred by NP for assessment of psychosocial needs.  Clinical Social Worker met with patient in exam room at [facility] to offer support and assess for needs.  Pt was [redacted] weeks pregnant and recently suffered a still birth; for which she is still grieving.  Pt was tearful and visibly grieving.  CSW validated pt's feelings and offered additional support.  Pt stated she had contacted [facility] and completed the referral information online, but has not been contacted.  CSW provided pt with grief counseling information from [facility], and discussed other possible support resources. Pt stated she plans to explore resources at [facility] and to contact her PCP to discuss a referral to a Psychiatrist.  CSW and pt are scheduled to follow up on 01/24/12 regarding counseling referrals and to address additional needs and concerns.          Tamala Julian, MSW, LCSW Clinical Social Worker [facility] [phone]

## 2012-01-23 ENCOUNTER — Encounter (HOSPITAL_COMMUNITY): Payer: Self-pay | Admitting: *Deleted

## 2012-01-23 ENCOUNTER — Emergency Department (HOSPITAL_COMMUNITY)
Admission: EM | Admit: 2012-01-23 | Discharge: 2012-01-23 | Disposition: A | Discharge status: Home / Self Care | Payer: Medicaid Other | Source: Home / Self Care

## 2012-01-23 DIAGNOSIS — R21 Rash and other nonspecific skin eruption: Secondary | ICD-10-CM

## 2012-01-23 HISTORY — DX: Depression, unspecified: F32.A

## 2012-01-23 HISTORY — DX: Major depressive disorder, single episode, unspecified: F32.9

## 2012-01-23 MED ORDER — CETIRIZINE HCL 10 MG PO TABS: 10.0000 mg | ORAL_TABLET | Freq: Every day | ORAL | Status: DC

## 2012-01-23 MED ORDER — FAMOTIDINE 20 MG PO TABS: 20.0000 mg | ORAL_TABLET | Freq: Two times a day (BID) | ORAL | Status: DC

## 2012-01-23 NOTE — ED Provider Notes (Signed)
History     CSN: [id number]  Arrival date & time 01/23/12  1910   None     Chief Complaint  Patient presents with  . Allergic Reaction    (Consider location/radiation/quality/duration/timing/severity/associated sxs/prior treatment) The history is provided by the patient.  This patient complains of a pruritic rash.  Location: bilateral forearms and upper chest  Onset: 4 hours ago   Course: unchanged Self-treated with: Benadryl           Improvement with treatment: Yes  History Itching: yes  Tenderness: no  New medications/antibiotics: Zoloft 2 weeks ago  Pet exposure: no  Recent travel or tropical exposure: no  New soaps, shampoos, detergent, clothing: no Tick/insect exposure: no   Red Flags Feeling ill: no Fever:no Facial/tongue swelling/difficulty breathing:  no  Diabetic or immunocompromised: no  Past Medical History  Diagnosis Date  . Thrombocytopenia     Idopathic with pregnancy  . No pertinent past medical history   . Pregnancy   . Gestational diabetes   . Depression     Past Surgical History  Procedure Date  . No past surgeries     Family History  Problem Relation Age of Onset  . Diabetes Mother   . Diabetes Maternal Grandmother     History  Substance Use Topics  . Smoking status: Never Smoker   . Smokeless tobacco: Never Used  . Alcohol Use: No     1-2 on weekend    OB History    Grav Para Term Preterm Abortions TAB SAB Ect Mult Living   3 3 1 2  0 0 0 0 0 2      Review of Systems  Constitutional: Negative.   HENT: Negative.   Respiratory: Negative.   Cardiovascular: Negative.   Skin: Positive for rash.  Neurological: Negative.     Allergies  Sulfa antibiotics  Home Medications   Current Outpatient Rx  Name Route Sig Dispense Refill  . PANTOPRAZOLE SODIUM 20 MG PO TBEC Oral Take 20 mg by mouth as needed.     Marland Kitchen PREDNISONE 5 MG PO TABS  Take as directed by physician. 60 tablet 0  . SERTRALINE HCL 25 MG PO TABS Oral Take 1  tablet (25 mg total) by mouth daily. 20 tablet 3  . ZOLPIDEM TARTRATE 5 MG PO TABS Oral Take 1 tablet (5 mg total) by mouth at bedtime as needed for sleep (insomnia.). 30 tablet 0  . CETIRIZINE HCL 10 MG PO TABS Oral Take 1 tablet (10 mg total) by mouth daily. 30 tablet 1  . FAMOTIDINE 20 MG PO TABS Oral Take 1 tablet (20 mg total) by mouth 2 (two) times daily. 30 tablet 0  . GLUCOSE BLOOD VI STRP  Use as instructed 100 each 1    BP 113/73  Pulse 86  Temp 98.5 F (36.9 C) (Oral)  Resp 16  SpO2 98%  Physical Exam  Nursing note and vitals reviewed. Constitutional: She is oriented to person, place, and time. Vital signs are normal. She appears well-developed and well-nourished. She is active and cooperative.  HENT:  Head: Normocephalic.  Right Ear: Hearing, tympanic membrane, external ear and ear canal normal.  Left Ear: Hearing, tympanic membrane, external ear and ear canal normal.  Nose: Nose normal.  Mouth/Throat: Uvula is midline, oropharynx is clear and moist and mucous membranes are normal. No uvula swelling.  Eyes: Conjunctivae normal are normal. Pupils are equal, round, and reactive to light. No scleral icterus.  Neck: Trachea normal and normal  range of motion. Neck supple. No thyromegaly present.  Cardiovascular: Normal rate, regular rhythm, normal heart sounds, intact distal pulses and normal pulses.   Pulmonary/Chest: Effort normal and breath sounds normal.  Lymphadenopathy:       Head (right side): No submental, no submandibular, no tonsillar, no preauricular, no posterior auricular and no occipital adenopathy present.       Head (left side): No submental, no submandibular, no tonsillar, no preauricular, no posterior auricular and no occipital adenopathy present.    She has no cervical adenopathy.  Neurological: She is alert and oriented to person, place, and time. No cranial nerve deficit or sensory deficit.  Skin: Skin is warm and dry. Rash noted. Rash is papular.        Fine diffuse papular rash bilateral arms, anterior chest and back  Psychiatric: She has a normal mood and affect. Her speech is normal and behavior is normal. Judgment and thought content normal. Cognition and memory are normal.    ED Course  Procedures (including critical care time)  Labs Reviewed - No data to display No results found.   1. Rash and nonspecific skin eruption       MDM  Stop Zoloft, I do not believe the Prednisone is a cause for your rash since you have been on it for 2-3 months.  Continue taper of Prednisone 20mg  BID as ordered.  Follow up with your physician that prescribed your Zoloft for a change in therapy.  Continue Benadryl daily for 3-5 days, you may use Claritin or Zyrtec during the day.  Cool showers; avoid heat, sunlight and anything that makes condition worse.  RTC if symptoms do not improve or begin to have problems swallowing, breathing or significant change in condition.        Johnsie Kindred, NP 01/25/12 [phone]

## 2012-01-23 NOTE — ED Notes (Signed)
Pt reports she took prednisone and zoloft this afternoon, which she normally takes,  And 30 minutes later she felt itching, then noticed a rash on her trunk, and arms

## 2012-01-26 ENCOUNTER — Other Ambulatory Visit: Payer: Medicaid Other | Admitting: Lab

## 2012-01-30 ENCOUNTER — Other Ambulatory Visit: Payer: Medicaid Other | Admitting: Lab

## 2012-01-30 ENCOUNTER — Ambulatory Visit: Payer: Medicaid Other | Admitting: Hematology and Oncology

## 2012-02-02 ENCOUNTER — Other Ambulatory Visit (HOSPITAL_BASED_OUTPATIENT_CLINIC_OR_DEPARTMENT_OTHER): Payer: Medicaid Other | Admitting: Lab

## 2012-02-02 DIAGNOSIS — D693 Immune thrombocytopenic purpura: Secondary | ICD-10-CM

## 2012-02-02 LAB — CBC WITH DIFFERENTIAL/PLATELET
Basophils Absolute: 0 10*3/uL (ref 0.0–0.1)
Eosinophils Absolute: 0 10*3/uL (ref 0.0–0.5)
HGB: 12.5 g/dL (ref 11.6–15.9)
MONO#: 0.4 10*3/uL (ref 0.1–0.9)
NEUT#: 5.6 10*3/uL (ref 1.5–6.5)
RBC: 4.21 10*6/uL (ref 3.70–5.45)
RDW: 15.9 % — ABNORMAL HIGH (ref 11.2–14.5)
WBC: 7.2 10*3/uL (ref 3.9–10.3)

## 2012-02-02 NOTE — ED Provider Notes (Signed)
Medical screening examination/treatment/procedure(s) were performed by resident physician or non-physician practitioner and as supervising physician I was immediately available for consultation/collaboration.   Edenilson Austad DOUGLAS MD.    Richetta Cubillos D Shirlee Whitmire, MD 02/02/12 0827 

## 2012-02-05 ENCOUNTER — Telehealth: Payer: Self-pay | Admitting: *Deleted

## 2012-02-05 DIAGNOSIS — D693 Immune thrombocytopenic purpura: Secondary | ICD-10-CM

## 2012-02-05 NOTE — Telephone Encounter (Signed)
Called and left VM requesting patient to call this office back asap.  Per Dr. Dalene Carrow, patient can take 5mg  prednisone daily, CBC will be rechecked in a week.  POF submitted for patient to come in Monday Oct. 7th

## 2012-02-06 ENCOUNTER — Other Ambulatory Visit: Payer: Self-pay | Admitting: *Deleted

## 2012-02-06 ENCOUNTER — Telehealth: Payer: Self-pay | Admitting: *Deleted

## 2012-02-06 NOTE — Telephone Encounter (Signed)
Spoke with pt and instructed pt re:  Continue with  Prednisone  5 mg po daily  As per md.   Informed pt that a scheduler will contact pt with appt for lab only in 1 week 02/13/12.   Pt voiced understanding.

## 2012-02-07 ENCOUNTER — Telehealth: Payer: Self-pay | Admitting: *Deleted

## 2012-02-07 NOTE — Telephone Encounter (Signed)
Patient confirmed over the phone the new date and time 

## 2012-02-13 ENCOUNTER — Telehealth: Payer: Self-pay | Admitting: Nurse Practitioner

## 2012-02-13 ENCOUNTER — Other Ambulatory Visit (HOSPITAL_BASED_OUTPATIENT_CLINIC_OR_DEPARTMENT_OTHER): Payer: Medicaid Other | Admitting: Lab

## 2012-02-13 DIAGNOSIS — O99119 Other diseases of the blood and blood-forming organs and certain disorders involving the immune mechanism complicating pregnancy, unspecified trimester: Secondary | ICD-10-CM

## 2012-02-13 DIAGNOSIS — D696 Thrombocytopenia, unspecified: Secondary | ICD-10-CM

## 2012-02-13 DIAGNOSIS — D693 Immune thrombocytopenic purpura: Secondary | ICD-10-CM

## 2012-02-13 LAB — CBC WITH DIFFERENTIAL/PLATELET
Basophils Absolute: 0 10*3/uL (ref 0.0–0.1)
Eosinophils Absolute: 0.1 10*3/uL (ref 0.0–0.5)
HCT: 36.8 % (ref 34.8–46.6)
HGB: 11.3 g/dL — ABNORMAL LOW (ref 11.6–15.9)
LYMPH%: 41 % (ref 14.0–49.7)
MCV: 93.4 fL (ref 79.5–101.0)
MONO#: 0.4 10*3/uL (ref 0.1–0.9)
NEUT#: 2.6 10*3/uL (ref 1.5–6.5)
NEUT%: 50.2 % (ref 38.4–76.8)
Platelets: 130 10*3/uL — ABNORMAL LOW (ref 145–400)
WBC: 5.3 10*3/uL (ref 3.9–10.3)

## 2012-02-13 NOTE — Telephone Encounter (Signed)
Left message for pt to call back re: per Dr. Dalene Carrow- platelets better, take prednisone QOD.

## 2012-02-15 NOTE — Addendum Note (Signed)
Addended by: Barbara Cower on: 02/15/2012 09:38 AM   Modules accepted: Orders

## 2012-02-15 NOTE — Telephone Encounter (Signed)
Spoke with patient- informed of instructions to decrease prednisone to QOD.  Pt verbalized understanding.

## 2012-02-20 ENCOUNTER — Telehealth: Payer: Self-pay | Admitting: Hematology and Oncology

## 2012-02-20 ENCOUNTER — Encounter: Payer: Self-pay | Admitting: Hematology and Oncology

## 2012-02-20 ENCOUNTER — Other Ambulatory Visit: Payer: Self-pay | Admitting: *Deleted

## 2012-02-20 ENCOUNTER — Ambulatory Visit (HOSPITAL_BASED_OUTPATIENT_CLINIC_OR_DEPARTMENT_OTHER): Payer: Medicaid Other | Admitting: Hematology and Oncology

## 2012-02-20 ENCOUNTER — Other Ambulatory Visit (HOSPITAL_BASED_OUTPATIENT_CLINIC_OR_DEPARTMENT_OTHER): Payer: Medicaid Other | Admitting: Lab

## 2012-02-20 VITALS — BP 95/67 | HR 87 | Temp 97.6°F | Resp 20 | Ht 63.0 in | Wt 143.3 lb

## 2012-02-20 DIAGNOSIS — D693 Immune thrombocytopenic purpura: Secondary | ICD-10-CM

## 2012-02-20 DIAGNOSIS — E039 Hypothyroidism, unspecified: Secondary | ICD-10-CM

## 2012-02-20 DIAGNOSIS — E538 Deficiency of other specified B group vitamins: Secondary | ICD-10-CM

## 2012-02-20 DIAGNOSIS — D689 Coagulation defect, unspecified: Secondary | ICD-10-CM

## 2012-02-20 DIAGNOSIS — Z862 Personal history of diseases of the blood and blood-forming organs and certain disorders involving the immune mechanism: Secondary | ICD-10-CM

## 2012-02-20 LAB — CBC WITH DIFFERENTIAL/PLATELET
BASO%: 0.5 % (ref 0.0–2.0)
HCT: 36.1 % (ref 34.8–46.6)
LYMPH%: 31.2 % (ref 14.0–49.7)
MCHC: 32.9 g/dL (ref 31.5–36.0)
MCV: 90.4 fL (ref 79.5–101.0)
MONO#: 0.4 10*3/uL (ref 0.1–0.9)
MONO%: 7.9 % (ref 0.0–14.0)
NEUT%: 59.2 % (ref 38.4–76.8)
Platelets: 121 10*3/uL — ABNORMAL LOW (ref 145–400)
WBC: 5 10*3/uL (ref 3.9–10.3)

## 2012-02-20 MED ORDER — CYANOCOBALAMIN 1000 MCG/ML IJ SOLN
1000.0000 ug | Freq: Once | INTRAMUSCULAR | Status: AC
  Administered 2012-02-20: 1000 ug via INTRAMUSCULAR

## 2012-02-20 NOTE — Telephone Encounter (Signed)
Scheduled patient @ 12 noon on 10/16/12, original date is taker informed Thu, RN, pt scheduled for U/S @ [facility] on 10/11/12, NPO 6 hours prior to CT, with labs same day

## 2012-02-20 NOTE — Telephone Encounter (Signed)
Lab appt added to appt and reprinted for pt pt has one the othe other calendars and weill call the pt for the one of the phone and the chemo is on the copy of the schedule of and on eo the offfff and iwll and

## 2012-02-20 NOTE — Progress Notes (Signed)
This office note has been dictated.

## 2012-02-20 NOTE — Patient Instructions (Signed)
Shelly Andrews  [id number]   [facility] - AFTER VISIT SUMMARY   **RECOMMENDATIONS MADE BY THE CONSULTANT AND ANY TEST    RESULTS WILL BE SENT TO YOUR REFERRING DOCTORS.   YOUR EXAM FINDINGS, LABS AND RESULTS WERE DISCUSSED BY YOUR MD TODAY.  YOU CAN GO TO THE  WEB SITE FOR INSTRUCTIONS ON HOW TO ASSESS MY CHART FOR ADDITIONAL INFORMATION AS NEEDED.  Your Updated drug allergies are: Allergies as of 02/20/2012 - Review Complete 02/20/2012  Allergen Reaction Noted  . Sulfa antibiotics Hives 06/24/2011    Your current list of medications are: Current Outpatient Prescriptions  Medication Sig Dispense Refill  . cetirizine (ZYRTEC ALLERGY) 10 MG tablet Take 1 tablet (10 mg total) by mouth daily.  30 tablet  1  . famotidine (PEPCID) 20 MG tablet Take 1 tablet (20 mg total) by mouth 2 (two) times daily.  30 tablet  0  . glucose blood test strip Use as instructed  100 each  1  . pantoprazole (PROTONIX) 20 MG tablet Take 20 mg by mouth as needed.       . predniSONE (DELTASONE) 5 MG tablet Take as directed by physician.  60 tablet  0  . sertraline (ZOLOFT) 25 MG tablet Take 1 tablet (25 mg total) by mouth daily.  20 tablet  3  . zolpidem (AMBIEN) 5 MG tablet Take 1 tablet (5 mg total) by mouth at bedtime as needed for sleep (insomnia.).  30 tablet  0     INSTRUCTIONS GIVEN AND DISCUSSED:  See attached schedule   SPECIAL INSTRUCTIONS/FOLLOW-UP:  See above.  I acknowledge that I have been informed and understand all the instructions given to me and received a copy.I know to contact the clinic, my physician, or go to the emergency Department if any problems should occur.   I do not have any more questions at this time, but understand that I may call the [facility] at [phone] during business hours should I have any further questions or need assistance in obtaining follow-up care.

## 2012-02-21 ENCOUNTER — Ambulatory Visit: Payer: Medicaid Other | Admitting: Hematology and Oncology

## 2012-02-21 ENCOUNTER — Other Ambulatory Visit: Payer: Medicaid Other | Admitting: Lab

## 2012-02-21 NOTE — Progress Notes (Signed)
CC:   Shelly Cairo, MD [name], M.D.  IDENTIFYING STATEMENT:  The patient is a 31 year old woman with history of gestational ITP, who presents for followup.  INTERVAL HISTORY:  Shelly Andrews continues to taper prednisone.  She is still recovering emotionally from stillbirth of her baby boy.  She otherwise feels well.  An ultrasound on 01/19/2012 had shown findings compatible with splenomegaly with the spleen measuring 13 cm.  Otherwise, the rest of the results were unremarkable.  TSH was low at 0.239 with a free T4 of 1.61.  Today's CBC notes a white cell count of 5, hemoglobin 11.9, hematocrit 36.1, platelets 121.  Vitamin B12 level was low at 178.  MEDICATIONS:  Reviewed, updated.  ALLERGIES:  Zoloft and sulfa.  PHYSICAL EXAMINATION:  Patient is alert and oriented x3.  Vitals:  Pulse 87, blood pressure 95/67, temperature 97.6, respirations 20, weight 143.3 pounds.  HEENT:  Head is atraumatic.  Sclerae are anicteric. Mouth:  No thrush.  Neck:  Supple.  Chest/CVS:  Unremarkable. Extremities:  No calf tenderness.  LABORATORY DATA:  As above.  IMPRESSION AND PLAN:  Shelly Andrews is a 31 year old woman with pregnancy- induced thrombocytopenia/ITP.  She will continue to taper prednisone over next 3 weeks and then discontinue.  Ultrasound notes splenomegaly, and this may be pregnancy-induced.  I recommend a followup ultrasound in about 9 months' time.  TSH is low consistent with hypothyroidism. The patient will follow up with her primary, Dr. Concepcion Elk, for management.  Vitamin B12 levels are also low, and this may have been from pregnancy.  We will give her a dose of vitamin B12 subcut before she departs clinic this morning.  She will also begin oral B12 replacement therapy by mouth.  She follows up for labs in 4 weeks' time.  She was offered  emotional support.    ______________________________ Shelly Andrews, M.D. LIO/MEDQ  D:  02/20/2012  T:  02/21/2012  Job:  [id number]

## 2012-03-13 ENCOUNTER — Other Ambulatory Visit (HOSPITAL_BASED_OUTPATIENT_CLINIC_OR_DEPARTMENT_OTHER): Payer: Medicaid Other | Admitting: Lab

## 2012-03-13 ENCOUNTER — Telehealth: Payer: Self-pay | Admitting: *Deleted

## 2012-03-13 DIAGNOSIS — Z862 Personal history of diseases of the blood and blood-forming organs and certain disorders involving the immune mechanism: Secondary | ICD-10-CM

## 2012-03-13 DIAGNOSIS — D693 Immune thrombocytopenic purpura: Secondary | ICD-10-CM

## 2012-03-13 LAB — CBC WITH DIFFERENTIAL/PLATELET
BASO%: 0.7 % (ref 0.0–2.0)
EOS%: 1.1 % (ref 0.0–7.0)
HCT: 34.8 % (ref 34.8–46.6)
LYMPH%: 30 % (ref 14.0–49.7)
MCH: 30.5 pg (ref 25.1–34.0)
MCHC: 34 g/dL (ref 31.5–36.0)
NEUT%: 61.2 % (ref 38.4–76.8)
Platelets: 127 10*3/uL — ABNORMAL LOW (ref 145–400)
lymph#: 1.3 10*3/uL (ref 0.9–3.3)

## 2012-03-13 NOTE — Telephone Encounter (Signed)
Spoke with pt and was informed that pt completed her last dose of Prednisone  10 mg on Sunday 03/10/12.   Dr. Dalene Carrow notified.   Called pt back and informed pt re:  OK to stop Prednisone due to platelet ct 127 as per md.   Confirmed appts for 2014 with pt.   Instructed pt to f/u with her primary for thyroid problem.    Pt voiced understanding.

## 2012-03-15 ENCOUNTER — Ambulatory Visit: Payer: Medicaid Other

## 2012-03-15 ENCOUNTER — Other Ambulatory Visit: Payer: Self-pay | Admitting: *Deleted

## 2012-03-15 ENCOUNTER — Other Ambulatory Visit (HOSPITAL_BASED_OUTPATIENT_CLINIC_OR_DEPARTMENT_OTHER): Payer: Medicaid Other | Admitting: *Deleted

## 2012-03-15 ENCOUNTER — Telehealth: Payer: Self-pay | Admitting: *Deleted

## 2012-03-15 ENCOUNTER — Telehealth: Payer: Self-pay

## 2012-03-15 DIAGNOSIS — D649 Anemia, unspecified: Secondary | ICD-10-CM

## 2012-03-15 DIAGNOSIS — D693 Immune thrombocytopenic purpura: Secondary | ICD-10-CM

## 2012-03-15 DIAGNOSIS — E539 Vitamin B deficiency, unspecified: Secondary | ICD-10-CM

## 2012-03-15 MED ORDER — CYANOCOBALAMIN 1000 MCG/ML IJ SOLN
1000.0000 ug | Freq: Once | INTRAMUSCULAR | Status: AC
  Administered 2012-03-15: 1000 ug via INTRAMUSCULAR

## 2012-03-15 MED ORDER — VITAMIN B-12 100 MCG PO TABS: 1000.0000 ug | ORAL_TABLET | Freq: Every day | ORAL | Status: DC

## 2012-03-15 NOTE — Telephone Encounter (Signed)
Per orders from 03-15-2012 placed patient on the injection schedule (but the desk nurse will be giving the patient the injection) these comments are in the appt.notes.  03-18-2012 at 2:30pm  03-20-2012 at 2:30pm  05-10-2012 lab only at 2:30pm

## 2012-03-15 NOTE — Telephone Encounter (Signed)
lvm that Dr Dalene Carrow wants to have Vit B12 replacement, an injection today, mon and tues. Also for pt to take vit B-12 1000 mcg sublingual daily. Asked pt to return call with times she would be able to come for injections.

## 2012-03-18 ENCOUNTER — Ambulatory Visit (HOSPITAL_BASED_OUTPATIENT_CLINIC_OR_DEPARTMENT_OTHER): Payer: Medicaid Other

## 2012-03-18 VITALS — BP 100/67 | HR 75 | Temp 97.9°F

## 2012-03-18 DIAGNOSIS — E539 Vitamin B deficiency, unspecified: Secondary | ICD-10-CM

## 2012-03-18 DIAGNOSIS — D693 Immune thrombocytopenic purpura: Secondary | ICD-10-CM

## 2012-03-18 MED ORDER — CYANOCOBALAMIN 1000 MCG/ML IJ SOLN
1000.0000 ug | Freq: Once | INTRAMUSCULAR | Status: AC
  Administered 2012-03-18: 1000 ug via INTRAMUSCULAR

## 2012-03-19 ENCOUNTER — Ambulatory Visit (HOSPITAL_BASED_OUTPATIENT_CLINIC_OR_DEPARTMENT_OTHER): Payer: Medicaid Other

## 2012-03-19 VITALS — BP 97/68 | HR 67 | Temp 98.1°F

## 2012-03-19 DIAGNOSIS — D693 Immune thrombocytopenic purpura: Secondary | ICD-10-CM

## 2012-03-19 DIAGNOSIS — E538 Deficiency of other specified B group vitamins: Secondary | ICD-10-CM

## 2012-03-19 MED ORDER — CYANOCOBALAMIN 1000 MCG/ML IJ SOLN
1000.0000 ug | Freq: Once | INTRAMUSCULAR | Status: AC
  Administered 2012-03-19: 1000 ug via INTRAMUSCULAR

## 2012-05-08 NOTE — L&D Delivery Note (Signed)
Delivery Note At 9:37 PM a viable female was delivered via Vaginal, Spontaneous Delivery (Presentation: ;  ).  APGAR: , ; weight .   Placenta status: Intact, Spontaneous.  Cord: mod tight nuchal X 1 >>clamped + cut,,    with the following complications: .  Cord pH: not sent  Anesthesia:  local Episiotomy: None Lacerations: Periurethral Suture Repair: 3.0 vicryl rapide Est. Blood Loss (mL): 300  Mom to postpartum.  Baby to nursery-stable.  Meriel Pica 03/01/2013, 9:50 PM

## 2012-05-10 ENCOUNTER — Other Ambulatory Visit (HOSPITAL_BASED_OUTPATIENT_CLINIC_OR_DEPARTMENT_OTHER): Payer: Medicaid Other | Admitting: Lab

## 2012-05-10 DIAGNOSIS — D693 Immune thrombocytopenic purpura: Secondary | ICD-10-CM

## 2012-05-10 LAB — CBC WITH DIFFERENTIAL/PLATELET
Basophils Absolute: 0 10*3/uL (ref 0.0–0.1)
EOS%: 1.5 % (ref 0.0–7.0)
Eosinophils Absolute: 0.1 10*3/uL (ref 0.0–0.5)
HGB: 12.1 g/dL (ref 11.6–15.9)
LYMPH%: 44.2 % (ref 14.0–49.7)
MCH: 28.8 pg (ref 25.1–34.0)
MCV: 86.6 fL (ref 79.5–101.0)
MONO%: 6.9 % (ref 0.0–14.0)
NEUT#: 1.9 10*3/uL (ref 1.5–6.5)
NEUT%: 47 % (ref 38.4–76.8)
Platelets: 120 10*3/uL — ABNORMAL LOW (ref 145–400)
RDW: 13.5 % (ref 11.2–14.5)

## 2012-05-24 ENCOUNTER — Other Ambulatory Visit: Payer: Self-pay | Admitting: Oncology

## 2012-05-24 ENCOUNTER — Telehealth: Payer: Self-pay | Admitting: Medical Oncology

## 2012-05-24 ENCOUNTER — Encounter: Payer: Self-pay | Admitting: Oncology

## 2012-05-24 DIAGNOSIS — E538 Deficiency of other specified B group vitamins: Secondary | ICD-10-CM

## 2012-05-24 NOTE — Progress Notes (Signed)
This patient was seen by Dr. Dalene Carrow most recently on 02/20/2012. The patient was receiving prednisone for ITP associated with pregnancy. She is no longer on prednisone. She apparently lost the baby.  CBC from 05/10/2012 showed a platelet count of 120,000. White count was 4.0, ANC 1.9, hemoglobin 12.1 and hematocrit 36.4.  In addition this patient has had a low vitamin B12 level. On 01/19/2012, vitamin B12 level was 178. On 03/13/2012 it was 229 and on 05/10/2012 it is also to 29. The patient received vitamin B12 injections 1000 mcg on 02/20/2012, November 8, 11, and 12, 2013.  I'm inclined to have this patient take a sublingual preparation of vitamin B12 at least 1000 mcg weekly and then recheck her levels. She will also need an appointment at some point to see either me or Annice Pih.  According to a phone message found in the patient's chart on 03/15/2012, the patient was supposed to be taking vitamin B12 1000 mcg sublingual on a daily basis.

## 2012-05-24 NOTE — Telephone Encounter (Signed)
I called pt per Dr. Arline Asp to verify that she is taking her Vit B12 sublingually as prescribed. She states that she take daily and has not missed any doses. I explained I will let Dr. Arline Asp know and we will call her with further instructions. She voiced understanding.

## 2012-05-25 ENCOUNTER — Other Ambulatory Visit: Payer: Self-pay | Admitting: Oncology

## 2012-05-25 DIAGNOSIS — D693 Immune thrombocytopenic purpura: Secondary | ICD-10-CM

## 2012-05-25 DIAGNOSIS — E538 Deficiency of other specified B group vitamins: Secondary | ICD-10-CM

## 2012-05-27 ENCOUNTER — Telehealth: Payer: Self-pay | Admitting: Oncology

## 2012-05-27 NOTE — Telephone Encounter (Signed)
s/w pt and she is aware of her inj on 1/23 and will get a sch at that time      Shelly Andrews

## 2012-05-30 ENCOUNTER — Ambulatory Visit (HOSPITAL_BASED_OUTPATIENT_CLINIC_OR_DEPARTMENT_OTHER): Payer: Medicaid Other

## 2012-05-30 VITALS — BP 92/64 | HR 79 | Temp 98.5°F

## 2012-05-30 DIAGNOSIS — D693 Immune thrombocytopenic purpura: Secondary | ICD-10-CM

## 2012-05-30 DIAGNOSIS — E538 Deficiency of other specified B group vitamins: Secondary | ICD-10-CM

## 2012-05-30 MED ORDER — CYANOCOBALAMIN 1000 MCG/ML IJ SOLN
1000.0000 ug | Freq: Once | INTRAMUSCULAR | Status: AC
  Administered 2012-05-30: 1000 ug via SUBCUTANEOUS

## 2012-06-06 ENCOUNTER — Ambulatory Visit: Payer: Medicaid Other

## 2012-06-07 ENCOUNTER — Telehealth: Payer: Self-pay | Admitting: Oncology

## 2012-06-07 ENCOUNTER — Ambulatory Visit (HOSPITAL_BASED_OUTPATIENT_CLINIC_OR_DEPARTMENT_OTHER): Payer: Medicaid Other

## 2012-06-07 VITALS — BP 105/71 | HR 82 | Temp 97.5°F

## 2012-06-07 DIAGNOSIS — D693 Immune thrombocytopenic purpura: Secondary | ICD-10-CM

## 2012-06-07 DIAGNOSIS — E538 Deficiency of other specified B group vitamins: Secondary | ICD-10-CM

## 2012-06-07 MED ORDER — CYANOCOBALAMIN 1000 MCG/ML IJ SOLN
1000.0000 ug | Freq: Once | INTRAMUSCULAR | Status: AC
  Administered 2012-06-07: 1000 ug via SUBCUTANEOUS

## 2012-06-07 NOTE — Telephone Encounter (Signed)
Pt came by and changed all injections appt from AM to PM

## 2012-06-09 ENCOUNTER — Encounter: Payer: Self-pay | Admitting: Oncology

## 2012-06-09 NOTE — Progress Notes (Signed)
This patient apparently had been taking vitamin B12 as directed, either sublingually or intranasally. Her vitamin B12 level remained low, 229 on 05/10/2012.  The patient was started on subcutaneous vitamin B12 1000 mcg per injection on 05/30/2012. She is to receive these weekly. It appears that she has already received injections on 05/30/2012 at 06/07/2012. She has an appointment to see me on 07/19/2012.

## 2012-06-13 ENCOUNTER — Ambulatory Visit: Payer: Medicaid Other

## 2012-06-13 ENCOUNTER — Ambulatory Visit (HOSPITAL_BASED_OUTPATIENT_CLINIC_OR_DEPARTMENT_OTHER): Payer: Medicaid Other

## 2012-06-13 VITALS — BP 105/70 | HR 88 | Temp 98.4°F

## 2012-06-13 DIAGNOSIS — E538 Deficiency of other specified B group vitamins: Secondary | ICD-10-CM

## 2012-06-13 DIAGNOSIS — D693 Immune thrombocytopenic purpura: Secondary | ICD-10-CM

## 2012-06-13 MED ORDER — CYANOCOBALAMIN 1000 MCG/ML IJ SOLN
1000.0000 ug | Freq: Once | INTRAMUSCULAR | Status: AC
Start: 2012-06-13 — End: 2012-06-13
  Administered 2012-06-13: 1000 ug via SUBCUTANEOUS

## 2012-06-20 ENCOUNTER — Telehealth: Payer: Self-pay | Admitting: *Deleted

## 2012-06-20 ENCOUNTER — Ambulatory Visit: Payer: Medicaid Other

## 2012-06-20 NOTE — Telephone Encounter (Signed)
Called about missed injection appointment.  Left message to call back and reschedule

## 2012-06-27 ENCOUNTER — Ambulatory Visit (HOSPITAL_BASED_OUTPATIENT_CLINIC_OR_DEPARTMENT_OTHER): Payer: Medicaid Other

## 2012-06-27 ENCOUNTER — Ambulatory Visit: Payer: Medicaid Other

## 2012-06-27 VITALS — BP 100/69 | HR 70 | Temp 98.5°F

## 2012-06-27 DIAGNOSIS — D693 Immune thrombocytopenic purpura: Secondary | ICD-10-CM

## 2012-06-27 DIAGNOSIS — E538 Deficiency of other specified B group vitamins: Secondary | ICD-10-CM

## 2012-06-27 MED ORDER — CYANOCOBALAMIN 1000 MCG/ML IJ SOLN
1000.0000 ug | Freq: Once | INTRAMUSCULAR | Status: AC
  Administered 2012-06-27: 1000 ug via SUBCUTANEOUS

## 2012-07-04 ENCOUNTER — Ambulatory Visit (HOSPITAL_BASED_OUTPATIENT_CLINIC_OR_DEPARTMENT_OTHER): Payer: Medicaid Other

## 2012-07-04 ENCOUNTER — Ambulatory Visit: Payer: Medicaid Other

## 2012-07-04 VITALS — BP 103/60 | HR 90 | Temp 98.4°F

## 2012-07-04 DIAGNOSIS — D693 Immune thrombocytopenic purpura: Secondary | ICD-10-CM

## 2012-07-04 MED ORDER — CYANOCOBALAMIN 1000 MCG/ML IJ SOLN
1000.0000 ug | Freq: Once | INTRAMUSCULAR | Status: AC
  Administered 2012-07-04: 1000 ug via SUBCUTANEOUS

## 2012-07-11 ENCOUNTER — Ambulatory Visit (HOSPITAL_BASED_OUTPATIENT_CLINIC_OR_DEPARTMENT_OTHER): Payer: Medicaid Other

## 2012-07-11 ENCOUNTER — Ambulatory Visit: Payer: Medicaid Other

## 2012-07-11 VITALS — BP 109/68 | HR 79 | Temp 98.6°F

## 2012-07-11 MED ORDER — CYANOCOBALAMIN 1000 MCG/ML IJ SOLN
1000.0000 ug | Freq: Once | INTRAMUSCULAR | Status: AC
Start: 2012-07-11 — End: 2012-07-11
  Administered 2012-07-11: 1000 ug via SUBCUTANEOUS

## 2012-07-19 ENCOUNTER — Other Ambulatory Visit (HOSPITAL_BASED_OUTPATIENT_CLINIC_OR_DEPARTMENT_OTHER): Payer: Medicaid Other | Admitting: Lab

## 2012-07-19 ENCOUNTER — Telehealth: Payer: Self-pay | Admitting: Oncology

## 2012-07-19 ENCOUNTER — Ambulatory Visit (HOSPITAL_BASED_OUTPATIENT_CLINIC_OR_DEPARTMENT_OTHER): Payer: Medicaid Other

## 2012-07-19 ENCOUNTER — Ambulatory Visit (HOSPITAL_BASED_OUTPATIENT_CLINIC_OR_DEPARTMENT_OTHER): Payer: Medicaid Other | Admitting: Oncology

## 2012-07-19 ENCOUNTER — Encounter: Payer: Self-pay | Admitting: Oncology

## 2012-07-19 VITALS — BP 103/75 | HR 87 | Temp 98.6°F | Resp 20 | Ht 63.0 in | Wt 138.2 lb

## 2012-07-19 DIAGNOSIS — D693 Immune thrombocytopenic purpura: Secondary | ICD-10-CM

## 2012-07-19 DIAGNOSIS — E538 Deficiency of other specified B group vitamins: Secondary | ICD-10-CM

## 2012-07-19 LAB — CBC WITH DIFFERENTIAL/PLATELET
BASO%: 0.4 % (ref 0.0–2.0)
LYMPH%: 38.6 % (ref 14.0–49.7)
MCHC: 33.3 g/dL (ref 31.5–36.0)
MONO#: 0.4 10*3/uL (ref 0.1–0.9)
Platelets: 143 10*3/uL — ABNORMAL LOW (ref 145–400)
RBC: 4.7 10*6/uL (ref 3.70–5.45)
WBC: 5.5 10*3/uL (ref 3.9–10.3)
lymph#: 2.1 10*3/uL (ref 0.9–3.3)

## 2012-07-19 LAB — COMPREHENSIVE METABOLIC PANEL (CC13)
AST: 13 U/L (ref 5–34)
Alkaline Phosphatase: 150 U/L (ref 40–150)
BUN: 10.6 mg/dL (ref 7.0–26.0)
Calcium: 8.8 mg/dL (ref 8.4–10.4)
Creatinine: 0.8 mg/dL (ref 0.6–1.1)
Glucose: 83 mg/dl (ref 70–99)

## 2012-07-19 LAB — VITAMIN B12: Vitamin B-12: 567 pg/mL (ref 211–911)

## 2012-07-19 MED ORDER — CYANOCOBALAMIN 1000 MCG/ML IJ SOLN
1000.0000 ug | Freq: Once | INTRAMUSCULAR | Status: AC
  Administered 2012-07-19: 1000 ug via SUBCUTANEOUS

## 2012-07-19 NOTE — Telephone Encounter (Signed)
gv and printed appt schedule for may and july

## 2012-07-19 NOTE — Progress Notes (Signed)
This office note has been dictated.  #[id number]

## 2012-07-22 NOTE — Progress Notes (Signed)
CC:   Shelly Andrews, M.D. Shelly Andrews  PROBLEM LIST: 1. Gestational thrombocytopenia first noted during the patient's first     pregnancy at the time of delivery on 10/31/2008.  Platelet count at     that time was 41,000.  The patient had an uncomplicated delivery     without regional anesthesia.  The patient received no treatment for     her thrombocytopenia.  During the patient's second pregnancy she     apparently was treated with prednisone and IVIG.  In June of 2011     the platelet count was in the 60,000 range.  With treatment by July     the platelet count was 119,000.  The second pregnancy terminated     successfully on 11/08/2009.  On 12/14/2009 the platelet count was     133,000.  In mid February 2013 the patient was in the early phase     of her third pregnancy.  Her platelet count was 116,000.  She     apparently was treated with prednisone and IVIG.  On 12/29/2011     platelet count was 79,000.  The baby was stillborn and apparently     delivered spontaneously the following day on 12/30/2011.  Following     that pregnancy the patient was weaned from prednisone and she is on     no therapy at this time.  Platelet count on 07/19/2012 is 143,000.     The patient does not know if she has ever had a platelet count nor     does she know of any platelet counts prior to 10/31/2008. 2. Splenomegaly noted on ultrasound on 01/19/2012.  The spleen     demonstrated a length of 13 cm on an ultrasound from 01/19/2012.     Overall volume was 513 mL with upper limits of normal being 411 mL. 3. Low vitamin B12 levels.  On 01/19/2012 vitamin B12 level was 178.  HISTORY:  Shelly Andrews is a 32 year old white female.  She is without complaints today.  She denies any significant bruising, especially without trauma.  She does bruise with trauma.  No undue bleeding.  The patient has been on oral vitamin B12 and has been receiving vitamin B12 shots 1000 mcg weekly since she was found to have a  vitamin B12 level of 229 on January 3 despite taking 1000 mcg of vitamin B12 daily for the previous few weeks.  The patient is without complaints today.  MEDICATIONS:  Reviewed and recorded. Current Outpatient Prescriptions  Medication Sig Dispense Refill  . pantoprazole (PROTONIX) 20 MG tablet Take 20 mg by mouth as needed.       . vitamin B-12 (CYANOCOBALAMIN) 100 MCG tablet Take 10 tablets (1,000 mcg total) by mouth daily. Take  1000 mcg  Sublingually   Daily  -  Starting  03/20/12.  30 tablet  2   No current facility-administered medications for this visit.     SMOKING HISTORY:  The patient has never smoked cigarettes.   PHYSICAL EXAM:  On physical exam the patient looks generally well. Weight is 138 pounds 3.2 ounces.  Height 5 feet 3 inches.  Body surface area 1.67 sq m.  Blood pressure 103/75.  Other vital signs are normal. There is no scleral icterus.  Mouth and pharynx are benign.  No peripheral adenopathy palpable.  Heart and lungs are normal.  The patient has a lot of tattoos.  Abdomen is somewhat obese, nontender with no organomegaly or masses palpable.  Extremities, no peripheral edema. No petechiae or purpura.  Neurologic exam was normal.  LABORATORY DATA:  Today, white count 5.5, ANC 2.9, hemoglobin 13.5, hematocrit 40.6, platelets 143,000.  Chemistries were normal.  LDH 136, albumin 3.5.  Vitamin B12 level on 05/10/2012 was 229.  On 03/13/2012 vitamin B12 level was 229 and on 01/19/2012 vitamin B12 level was 178.  IMAGING STUDIES:  Complete abdominal ultrasound on 01/19/2012 showed the spleen to be slightly enlarged with an overall volume of 513 mL with normal being less than 411 mL.  Spleen length was 13 cm.  No focal parenchymal abnormality was noted.  IMPRESSION AND PLAN:  The patient is stable at this time, with a platelet count of 143,000.  I believe this is the highest platelet count I have seen. It is borderline normal.  We will plan to check the  following labs in 2 months:  CBC, peripheral smear and morphology to look for large and giant platelets suggesting some degree of ITP, ANA, HIV serology for hepatitis B and hepatitis C. 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  We will plan to see the patient again in 4 months, at which time we will check a CBC.  I will also be checking a vitamin B12 level in 2 months.  For now, we are going to hold further vitamin B12 injections.  I have encouraged the patient to take oral vitamin B12 1000 mcg per day.  We may try to increase that if necessary.  We will try to avoid vitamin B12 injections.  We may want to check for antigastrin and antiparietal antibodies if we think the patient has pernicious anemia.  If the patient becomes pregnant she will be in touch with [place] prior to those scheduled appointments.  Extensive records were reviewed for this appointment.    ______________________________ Samul Dada, M.D. DSM/MEDQ  D:  07/19/2012  T:  07/20/2012  Job:  [id number]

## 2012-07-26 ENCOUNTER — Encounter (HOSPITAL_COMMUNITY): Payer: Self-pay

## 2012-07-26 ENCOUNTER — Inpatient Hospital Stay (HOSPITAL_COMMUNITY)
Admission: AD | Admit: 2012-07-26 | Discharge: 2012-07-26 | Disposition: A | Discharge status: Home / Self Care | Payer: Medicaid Other | Source: Ambulatory Visit | Attending: Obstetrics and Gynecology | Admitting: Obstetrics and Gynecology

## 2012-07-26 DIAGNOSIS — Z711 Person with feared health complaint in whom no diagnosis is made: Secondary | ICD-10-CM

## 2012-07-26 DIAGNOSIS — O219 Vomiting of pregnancy, unspecified: Secondary | ICD-10-CM

## 2012-07-26 DIAGNOSIS — Z3201 Encounter for pregnancy test, result positive: Secondary | ICD-10-CM | POA: Insufficient documentation

## 2012-07-26 DIAGNOSIS — R112 Nausea with vomiting, unspecified: Secondary | ICD-10-CM | POA: Insufficient documentation

## 2012-07-26 LAB — URINALYSIS, ROUTINE W REFLEX MICROSCOPIC
Bilirubin Urine: NEGATIVE
Glucose, UA: NEGATIVE mg/dL
Hgb urine dipstick: NEGATIVE
Nitrite: NEGATIVE
Specific Gravity, Urine: 1.01 (ref 1.005–1.030)
pH: 6 (ref 5.0–8.0)

## 2012-07-26 MED ORDER — ONDANSETRON 8 MG PO TBDP
8.0000 mg | ORAL_TABLET | Freq: Once | ORAL | Status: AC
  Administered 2012-07-26: 8 mg via ORAL
  Filled 2012-07-26: qty 1

## 2012-07-26 MED ORDER — DOCUSATE SODIUM 100 MG PO CAPS: 100.0000 mg | ORAL_CAPSULE | Freq: Two times a day (BID) | ORAL | Status: DC

## 2012-07-26 MED ORDER — ONDANSETRON 8 MG PO TBDP: 8.0000 mg | ORAL_TABLET | Freq: Three times a day (TID) | ORAL | Status: DC | PRN

## 2012-07-26 NOTE — MAU Provider Note (Signed)
History     CSN: [id number]  Arrival date and time: 07/26/12 1056   None     Chief Complaint  Patient presents with  . Possible Pregnancy  . Headache  . Nausea   HPI 32 y.o. [id number] at [redacted]w[redacted]d by LMP. + UPT at home, was on OCPs, unexpected pregnancy. Feeling nauseous and having intermittent headaches for a few days, no vomiting. Very emotional. IUFD at 31 weeks last August. Feeling scared about this pregnancy. 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 Also states that she feels last baby died to spare her life, so she is afraid she will die this time. Very tearful and emotional.   Past Medical History  Diagnosis Date  . Thrombocytopenia     Idopathic with pregnancy  . No pertinent past medical history   . Pregnancy   . Gestational diabetes   . Depression   . Asthma     Past Surgical History  Procedure Laterality Date  . No past surgeries      Family History  Problem Relation Age of Onset  . Diabetes Mother   . Diabetes Maternal Grandmother     History  Substance Use Topics  . Smoking status: Never Smoker   . Smokeless tobacco: Never Used  . Alcohol Use: No     Comment: 1-2 on weekend    Allergies:  Allergies  Allergen Reactions  . Zoloft (Sertraline Hcl) Hives  . Sulfa Antibiotics Hives    Prescriptions prior to admission  Medication Sig Dispense Refill  . Cyanocobalamin (VITAMIN B 12 PO) Take 2 tablets by mouth daily.        Review of Systems  Constitutional: Negative.   Respiratory: Negative.   Cardiovascular: Negative.   Gastrointestinal: Positive for nausea. Negative for vomiting, abdominal pain, diarrhea and constipation.  Genitourinary: Negative for dysuria, urgency, frequency, hematuria and flank pain.       Negative for vaginal bleeding, vaginal discharge, dyspareunia  Musculoskeletal: Negative.   Neurological:  Positive for headaches.  Psychiatric/Behavioral: The patient is nervous/anxious.    Physical Exam   Blood pressure 114/77, height 5\' 3"  (1.6 m), weight 138 lb 12.8 oz (62.959 kg), last menstrual period 06/22/2012.  Physical Exam  Nursing note and vitals reviewed. Constitutional: She is oriented to person, place, and time. She appears well-developed and well-nourished. She appears distressed (tearful).  Cardiovascular: Normal rate.   Respiratory: Effort normal.  Musculoskeletal: Normal range of motion.  Neurological: She is alert and oriented to person, place, and time.  Skin: Skin is warm and dry.  Psychiatric: Her speech is normal and behavior is normal. Judgment and thought content normal. Her mood appears anxious. Cognition and memory are normal.    MAU Course  Procedures  Results for orders placed during the hospital encounter of 07/26/12 (from the past 24 hour(s))  URINALYSIS, ROUTINE W REFLEX MICROSCOPIC     Status: None   Collection Time    07/26/12 11:15 AM      Result Value Range   Color, Urine YELLOW  YELLOW   APPearance CLEAR  CLEAR   Specific Gravity, Urine 1.010  1.005 - 1.030   pH 6.0  5.0 - 8.0   Glucose, UA NEGATIVE  NEGATIVE mg/dL   Hgb urine dipstick NEGATIVE  NEGATIVE   Bilirubin Urine NEGATIVE  NEGATIVE   Ketones, ur NEGATIVE  NEGATIVE mg/dL  Protein, ur NEGATIVE  NEGATIVE mg/dL   Urobilinogen, UA 0.2  0.0 - 1.0 mg/dL   Nitrite NEGATIVE  NEGATIVE   Leukocytes, UA NEGATIVE  NEGATIVE  POCT PREGNANCY, URINE     Status: Abnormal   Collection Time    07/26/12 11:19 AM      Result Value Range   Preg Test, Ur POSITIVE (*) NEGATIVE   Zofran ODT 8 mg given in MAU  Assessment and Plan   1. Physically well but worried   2. Nausea and vomiting in pregnancy prior to [redacted] weeks gestation       Medication List    TAKE these medications       docusate sodium 100 MG capsule  Commonly known as:  COLACE  Take 1 capsule (100 mg total) by mouth 2 (two) times  daily.     ondansetron 8 MG disintegrating tablet  Commonly known as:  ZOFRAN ODT  Take 1 tablet (8 mg total) by mouth every 8 (eight) hours as needed for nausea.     VITAMIN B 12 PO  Take 2 tablets by mouth daily.        Follow-up Information   Schedule an appointment as soon as possible for a visit with [facility] Cristie Hem, MD.   Contact information:   [address] [place] [address] [phone]         [facility] 07/26/2012, 12:40 PM

## 2012-07-26 NOTE — MAU Note (Signed)
Patient states she had a positive pregnancy test at home yesterday. Has not been feeling well for a couple of days with nausea, no vomiting, and on and off headaches. Denies bleeding or discharge.

## 2012-08-27 LAB — OB RESULTS CONSOLE ANTIBODY SCREEN: Antibody Screen: NEGATIVE

## 2012-08-27 LAB — OB RESULTS CONSOLE GC/CHLAMYDIA: Gonorrhea: NEGATIVE

## 2012-08-27 LAB — OB RESULTS CONSOLE ABO/RH

## 2012-08-28 ENCOUNTER — Telehealth: Payer: Self-pay

## 2012-08-28 NOTE — Telephone Encounter (Signed)
Pt called stating that she is pregnant. Her LMP was 06/22/12. Dr Arline Asp saw pt in March, at which time she did not know she was pregnant. 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  She wishes to hold off on any prednisone as long as possible with this pregnancy. Her last pregnancy ended in still birth due to gestational diabetes. Her OB/GYN is Dr Mitchel Honour. She has labs scheduled 09/13/12, 10/11/12 and labs w/Dr Murinson visit on 11/11/12. This information forwarded to Dr Arline Asp for evaluation.

## 2012-09-13 ENCOUNTER — Other Ambulatory Visit (HOSPITAL_BASED_OUTPATIENT_CLINIC_OR_DEPARTMENT_OTHER): Payer: Medicaid Other

## 2012-09-13 DIAGNOSIS — D693 Immune thrombocytopenic purpura: Secondary | ICD-10-CM

## 2012-09-13 DIAGNOSIS — E538 Deficiency of other specified B group vitamins: Secondary | ICD-10-CM

## 2012-09-13 DIAGNOSIS — D696 Thrombocytopenia, unspecified: Secondary | ICD-10-CM

## 2012-09-13 LAB — CBC WITH DIFFERENTIAL/PLATELET
Basophils Absolute: 0 10*3/uL (ref 0.0–0.1)
Eosinophils Absolute: 0 10*3/uL (ref 0.0–0.5)
HCT: 37.1 % (ref 34.8–46.6)
HGB: 12.6 g/dL (ref 11.6–15.9)
LYMPH%: 30.3 % (ref 14.0–49.7)
MONO#: 0.2 10*3/uL (ref 0.1–0.9)
NEUT#: 3.5 10*3/uL (ref 1.5–6.5)
NEUT%: 64.2 % (ref 38.4–76.8)
Platelets: 114 10*3/uL — ABNORMAL LOW (ref 145–400)
RBC: 4.25 10*6/uL (ref 3.70–5.45)
WBC: 5.4 10*3/uL (ref 3.9–10.3)

## 2012-09-13 LAB — MORPHOLOGY

## 2012-09-13 LAB — CHCC SMEAR

## 2012-09-16 ENCOUNTER — Encounter: Payer: Self-pay | Admitting: Oncology

## 2012-09-16 ENCOUNTER — Other Ambulatory Visit: Payer: Self-pay | Admitting: Oncology

## 2012-09-16 DIAGNOSIS — D693 Immune thrombocytopenic purpura: Secondary | ICD-10-CM

## 2012-09-16 LAB — ANA: Anti Nuclear Antibody(ANA): NEGATIVE

## 2012-09-16 LAB — HEPATITIS B CORE ANTIBODY, TOTAL: Hep B Core Total Ab: NEGATIVE

## 2012-09-16 LAB — HEPATITIS C ANTIBODY: HCV Ab: NEGATIVE

## 2012-09-16 NOTE — Progress Notes (Signed)
This patient apparently is [redacted] weeks pregnant.   On 09/13/2012 she had the following labs:  Platelet count 114,000. Platelet count was 143,000 on 07/19/2012. Vitamin B12  362 ANA negative Hepatitis B antigen negative Hepatitis B surface antibody was reactive. We need to check to see if this patient received the hepatitis B vaccine Hepatitis B core IgM negative Hepatitis B core total negative Hepatitis C. virus negative HIV negative    We will check a CBC around 10/14/2012. The patient has an appointment to see me with labs around 11/11/2012.

## 2012-09-17 ENCOUNTER — Other Ambulatory Visit: Payer: Self-pay

## 2012-09-17 ENCOUNTER — Telehealth: Payer: Self-pay | Admitting: Oncology

## 2012-09-23 ENCOUNTER — Other Ambulatory Visit (HOSPITAL_COMMUNITY): Payer: Self-pay | Admitting: Obstetrics & Gynecology

## 2012-09-23 DIAGNOSIS — O09299 Supervision of pregnancy with other poor reproductive or obstetric history, unspecified trimester: Secondary | ICD-10-CM

## 2012-09-24 ENCOUNTER — Telehealth: Payer: Self-pay | Admitting: Medical Oncology

## 2012-09-24 NOTE — Telephone Encounter (Signed)
Pt called asking for her last CBC results. She is aware of her next lab 10/11/12.

## 2012-10-11 ENCOUNTER — Ambulatory Visit (HOSPITAL_COMMUNITY)
Admission: RE | Admit: 2012-10-11 | Discharge: 2012-10-11 | Disposition: A | Discharge status: Home / Self Care | Payer: Medicaid Other | Source: Ambulatory Visit | Attending: Hematology and Oncology | Admitting: Hematology and Oncology

## 2012-10-11 ENCOUNTER — Other Ambulatory Visit (HOSPITAL_BASED_OUTPATIENT_CLINIC_OR_DEPARTMENT_OTHER): Payer: Medicaid Other | Admitting: Lab

## 2012-10-11 ENCOUNTER — Other Ambulatory Visit: Payer: Self-pay | Admitting: Hematology and Oncology

## 2012-10-11 DIAGNOSIS — Z862 Personal history of diseases of the blood and blood-forming organs and certain disorders involving the immune mechanism: Secondary | ICD-10-CM

## 2012-10-11 DIAGNOSIS — R161 Splenomegaly, not elsewhere classified: Secondary | ICD-10-CM | POA: Insufficient documentation

## 2012-10-11 DIAGNOSIS — D693 Immune thrombocytopenic purpura: Secondary | ICD-10-CM

## 2012-10-11 LAB — CBC WITH DIFFERENTIAL/PLATELET
BASO%: 0.3 % (ref 0.0–2.0)
Eosinophils Absolute: 0 10*3/uL (ref 0.0–0.5)
LYMPH%: 29.2 % (ref 14.0–49.7)
MCHC: 34.8 g/dL (ref 31.5–36.0)
MCV: 87.2 fL (ref 79.5–101.0)
MONO%: 5.3 % (ref 0.0–14.0)
NEUT#: 3.6 10*3/uL (ref 1.5–6.5)
RBC: 3.95 10*6/uL (ref 3.70–5.45)
RDW: 13.7 % (ref 11.2–14.5)
WBC: 5.7 10*3/uL (ref 3.9–10.3)

## 2012-10-16 ENCOUNTER — Ambulatory Visit: Payer: Medicaid Other | Admitting: Hematology and Oncology

## 2012-10-16 ENCOUNTER — Ambulatory Visit: Payer: Medicaid Other | Admitting: Oncology

## 2012-10-17 ENCOUNTER — Telehealth: Payer: Self-pay | Admitting: Oncology

## 2012-10-17 NOTE — Telephone Encounter (Signed)
Moved 7/7 appt to covering provider due to DM's departure. lmonvm for pt re new time for 7/7 @ 2:30pm lb/fu.

## 2012-10-22 ENCOUNTER — Ambulatory Visit (HOSPITAL_COMMUNITY)
Admission: RE | Admit: 2012-10-22 | Discharge: 2012-10-22 | Disposition: A | Discharge status: Home / Self Care | Payer: Medicaid Other | Source: Ambulatory Visit | Attending: Obstetrics & Gynecology | Admitting: Obstetrics & Gynecology

## 2012-10-22 ENCOUNTER — Encounter (HOSPITAL_COMMUNITY): Payer: Self-pay

## 2012-10-22 VITALS — BP 104/64 | HR 110 | Wt 139.0 lb

## 2012-10-22 DIAGNOSIS — Z8751 Personal history of pre-term labor: Secondary | ICD-10-CM | POA: Insufficient documentation

## 2012-10-22 DIAGNOSIS — D689 Coagulation defect, unspecified: Secondary | ICD-10-CM | POA: Insufficient documentation

## 2012-10-22 DIAGNOSIS — D693 Immune thrombocytopenic purpura: Secondary | ICD-10-CM | POA: Insufficient documentation

## 2012-10-22 DIAGNOSIS — O09299 Supervision of pregnancy with other poor reproductive or obstetric history, unspecified trimester: Secondary | ICD-10-CM | POA: Insufficient documentation

## 2012-10-22 DIAGNOSIS — O358XX0 Maternal care for other (suspected) fetal abnormality and damage, not applicable or unspecified: Secondary | ICD-10-CM | POA: Insufficient documentation

## 2012-10-22 NOTE — Consult Note (Signed)
MFM consult  32 yr old [id number] at [redacted]w[redacted]d with ITP, history of preterm delivery, and history of IUFD for consult.  Patient reports no complications this pregnancy. No current complaints.  Past OB hx:  2010- full term SVD; on admission was found to have thrombocytopenia; did not have regional anesthesia; infant weighed 8#10oz 2011: preterm labor and delivery at 35 weeks; SVD; thrombocytopenia managed with prednisone and IVIG; gestational diabetes managed with metformin; infant weighed 5#7oz 2013: thrombocytopenia managed with prednisone and IVIG, gestational diabetes managed with glyburide; had IUFD at 31 weeks; patient reports autopsy was normal; had normal anatomic survey and normal first trimester screen; Pathology from placenta states immature placenta with meconium and increased fibrin deposition - infectious work up negative - antiphospholipid antibodies negative - hgbA1C was 6/1% - infant weighed 6#3oz  Past Medical history:  ITP Vitamin B12 deficiency  PSH; none Medications: vitamin B12; PNV, protonix Allergies: sulfa, zoloft Social history: negative Family history: mother has diabetes  A: 60 yr old [id number] at [redacted]w[redacted]d with ITP, previous preterm delivery, and previous stillbirth.  I counseled the patient as follows:  1. ITP: - patient is followed by Hematology - recent platelets on 10/11/12 were 99,000 - discussed the goal in early pregnancy is to prevent bleeding rather than normalize platelet count - in the absence of bleeding therapy to raise platelet counts are indication for those with platelets <30-50,000 - in the setting of bleeding a platelet transfusion should be considered - recommend monitor platelet count monthly and more frequently if platelet count decreases - when therapy is indicated recommend using prednisone as initial therapy (patient has responded in the past); IVIG may also be used if not responding or if more rapid response is needed although increase in  platelet count with IVIG is usually temporary - discussed risk of fetal thrombocytopenia of 10-15%  - delivery considerations: - a platelet count of >50,000 is considered safe for delivery either vaginal or C section - if treatment is indicated treatment should be initiated at least 1 week prior to scheduled delivery as may take a week to have effect - NSAIDs, antiinflammatory, aspirin should be avoided in women with thrombocytopenia - if delivery is needed and platelet counts are <50,000 recommend transfusion then proceed with delivery - recommend avoid operative delivery - risk of fetal thrombocytopenia does not correlate to maternal platelet count and altering of management is not recommended; fetal sampling is also not recommended given risks of mortality or hemorrhage with these procedures outweighs risk of mortality or hemorrhage - C sections should be reserved for obstetric indications and have not been shown to lower the risk of neonatal hemorrhage or outcome - recommend inform Pediatrics at delivery; as neonates can develop thrombocytopenia even days after delivery and they should be followed  2. Previous IUFD: Discussed etiology is uncertain but there is suspicion that diabetes played a role given the size of the infant at 31 weeks - discussed relatively low recurrence risk - diabetes management- see below - work up has been negative; no other work up recommended at this time - recommend fetal anatomic survey - recommend fetal growth every 4 weeks - recommend start antenatal testing at 28-[redacted] weeks gestation - recommend delivery by estimated due date; delivery timing should be decided based on whole clinical picture given ITP - recommend fetal kick counts starting at [redacted] weeks gestation  3. Previous gestational diabetes: - discussed patient is at increased risk of gestational and type II diabetes given family and personal history -  patient has been checking blood sugars but at  random times - fasting levels appear normal - recommend obtain hemoglobin A1C; if abnormal recommend check blood sugars fasting and 2 hours postprandial and refer to diabetic educator; recommend medical management to keep fastings <90 and 2 hour postprandial values <120; if hemoglobin A1C normal recommend obtain early glucola and if normal repeat at 26-[redacted] weeks gestation - discussed may have contributed to previous fetal demise but cannot say with certainty  4. History of preterm delivery: I discussed that with a history of preterm delivery the risk is increased in future pregnancies. Studies have shown that the frequency of recurrent preterm birth was 14 to 22 % after one preterm delivery, 28 to 42 % after two preterm deliveries, and 67% after three preterm deliveries. Term births decreased the risk of preterm birth in subsequent pregnancies.  I explained weekly injections of 17 OH progesterone from 16-36 weeks has been shown to decrease the risk of recurrent preterm birth by up to 40%.I discussed we do not know of any adverse effects of progesterone however long term data is limited. Discussed given her prior delivery was at 35 weeks and she had a previous preterm delivery that she is at increased risk but likely not to the level of someone with an earlier delivery or multiple early deliveries and that we would offer progesterone but it is her choice if she would like to proceed.   Discussed option of serial cervical length surveillance and adding progesterone (vaginal) if patient has short cervix.  Patient would like to proceed with cervical length surveillance. Will check cervical length on anatomy survey next week and recommend check every 2 weeks until 28 weeks. Recommend close surveillance for the development of signs/symptoms of preterm labor/PPROM.  5. Recommend check TSH as previous value from 2013 was low. 6. Vitamin B12 deficiency: - followed by Hematology - recommend continue  supplement 7. Patient reports had a normal first trimester screen.  I spent 60 minutes in face to face consultation with the patient.  Eulis Foster, MD

## 2012-10-25 NOTE — Addendum Note (Signed)
Encounter addended by: Ty Hilts, RN on: 10/25/2012  2:05 PM<BR>     Documentation filed: Charges VN

## 2012-10-29 ENCOUNTER — Ambulatory Visit (HOSPITAL_COMMUNITY)
Admission: RE | Admit: 2012-10-29 | Discharge: 2012-10-29 | Disposition: A | Discharge status: Home / Self Care | Payer: Medicaid Other | Source: Ambulatory Visit | Attending: Obstetrics & Gynecology | Admitting: Obstetrics & Gynecology

## 2012-10-29 ENCOUNTER — Telehealth: Payer: Self-pay | Admitting: Oncology

## 2012-10-29 ENCOUNTER — Other Ambulatory Visit: Payer: Self-pay | Admitting: Oncology

## 2012-10-29 ENCOUNTER — Encounter (HOSPITAL_COMMUNITY): Payer: Self-pay

## 2012-10-29 ENCOUNTER — Encounter: Payer: Self-pay | Admitting: Oncology

## 2012-10-29 ENCOUNTER — Encounter (HOSPITAL_COMMUNITY): Payer: Medicaid Other

## 2012-10-29 DIAGNOSIS — Z8751 Personal history of pre-term labor: Secondary | ICD-10-CM | POA: Insufficient documentation

## 2012-10-29 DIAGNOSIS — O0990 Supervision of high risk pregnancy, unspecified, unspecified trimester: Secondary | ICD-10-CM | POA: Insufficient documentation

## 2012-10-29 DIAGNOSIS — Z1389 Encounter for screening for other disorder: Secondary | ICD-10-CM | POA: Insufficient documentation

## 2012-10-29 DIAGNOSIS — D693 Immune thrombocytopenic purpura: Secondary | ICD-10-CM

## 2012-10-29 DIAGNOSIS — O09299 Supervision of pregnancy with other poor reproductive or obstetric history, unspecified trimester: Secondary | ICD-10-CM | POA: Insufficient documentation

## 2012-10-29 DIAGNOSIS — Z363 Encounter for antenatal screening for malformations: Secondary | ICD-10-CM | POA: Insufficient documentation

## 2012-10-29 DIAGNOSIS — O358XX0 Maternal care for other (suspected) fetal abnormality and damage, not applicable or unspecified: Secondary | ICD-10-CM | POA: Insufficient documentation

## 2012-10-29 NOTE — Telephone Encounter (Signed)
PT SCHEDULED TO SEE DR. GRANFORTUNA ON 06/25 @ 11 PER MD REFERRING DR. Aundra Millet [facility]RIS DX-H/O ITP IN PREGNANCY WELCOME PACKET AT REGISTRATION.

## 2012-10-29 NOTE — Telephone Encounter (Signed)
C/D 10/29/12 for appt. 10/30/12 °

## 2012-10-29 NOTE — Telephone Encounter (Signed)
PT SCHEDULED TO SEE DR. [facility] 06/25 @ 11. REFERRING DR. Aundra Millet M[facility] DX- H/O ITP IN PREGNANCY   WELCOME PACKET AT REGISTRATION REFERRING OFFICE IS AWARE OF APPT,

## 2012-10-30 ENCOUNTER — Telehealth: Payer: Self-pay | Admitting: Oncology

## 2012-10-30 ENCOUNTER — Encounter: Payer: Self-pay | Admitting: Oncology

## 2012-10-30 ENCOUNTER — Ambulatory Visit: Payer: Medicaid Other

## 2012-10-30 ENCOUNTER — Other Ambulatory Visit (HOSPITAL_BASED_OUTPATIENT_CLINIC_OR_DEPARTMENT_OTHER): Payer: Medicaid Other | Admitting: Lab

## 2012-10-30 ENCOUNTER — Ambulatory Visit (HOSPITAL_BASED_OUTPATIENT_CLINIC_OR_DEPARTMENT_OTHER): Payer: Medicaid Other | Admitting: Oncology

## 2012-10-30 VITALS — BP 101/60 | HR 87 | Temp 98.4°F | Resp 18 | Ht 63.0 in | Wt 139.1 lb

## 2012-10-30 DIAGNOSIS — O0990 Supervision of high risk pregnancy, unspecified, unspecified trimester: Secondary | ICD-10-CM

## 2012-10-30 DIAGNOSIS — D693 Immune thrombocytopenic purpura: Secondary | ICD-10-CM

## 2012-10-30 LAB — COMPREHENSIVE METABOLIC PANEL (CC13)
Alkaline Phosphatase: 70 U/L (ref 40–150)
BUN: 4.8 mg/dL — ABNORMAL LOW (ref 7.0–26.0)
CO2: 23 mEq/L (ref 22–29)
Glucose: 59 mg/dl — ABNORMAL LOW (ref 70–99)
Sodium: 140 mEq/L (ref 136–145)
Total Bilirubin: 0.29 mg/dL (ref 0.20–1.20)
Total Protein: 6.1 g/dL — ABNORMAL LOW (ref 6.4–8.3)

## 2012-10-30 LAB — CBC WITH DIFFERENTIAL/PLATELET
BASO%: 0.2 % (ref 0.0–2.0)
Eosinophils Absolute: 0 10*3/uL (ref 0.0–0.5)
MCHC: 33.4 g/dL (ref 31.5–36.0)
MONO#: 0.4 10*3/uL (ref 0.1–0.9)
NEUT#: 3.4 10*3/uL (ref 1.5–6.5)
Platelets: 108 10*3/uL — ABNORMAL LOW (ref 145–400)
RBC: 3.7 10*6/uL (ref 3.70–5.45)
RDW: 13.8 % (ref 11.2–14.5)
WBC: 5.2 10*3/uL (ref 3.9–10.3)
lymph#: 1.4 10*3/uL (ref 0.9–3.3)

## 2012-10-30 LAB — MORPHOLOGY: PLT EST: DECREASED

## 2012-10-30 NOTE — Progress Notes (Signed)
Hematology and Oncology Follow Up Visit  Shelly Andrews [id number] Aug 28, 1980 32 y.o. 10/30/2012 1:09 PM   Principle Diagnosis: Encounter Diagnoses  Name Primary?  . ITP (idiopathic thrombocytopenic purpura) Yes  . Pregnancy, supervision of, high-risk, unspecified trimester      Interim History:   Followup visit for this 32 year old woman who has seen 2 previous physicians in this office. Please see their detailed notes summarizing her past medical history. Briefly, she is an otherwise healthy woman with no medical or  surgical illness on no chronic medication. She is Rh-. She has never required a blood transfusion. There is no history of hepatitis or HIV she tested negative for hepatitis C., HIV. Hepatitis B surface antigen negative, surface antibody positive, core antibody not detected. She does not use any recreational drugs. She was diagnosed with ITP at the time of her first pregnancy when platelet count fell to 41,000. She delivered that child vaginally with no anesthetics. She developed ITP again during her second, third, and fourth pregnancies. She carried the second pregnancy to 35 weeks, the third pregnancy to 31 weeks, and unfortunately had a stillborn at 95 weeks with the fourth pregnancy. She received prednisone and intravenous immunoglobulin to maintain her platelet counts in a safe range. She was told that she developed gestational diabetes from the steroids during the most recent pregnancy that resulted in a stillborn. Genetic screening was unremarkable. She has had the same father for the 4 pregnancies. She was given RhoGAM in view of her Rh- status.  She is now pregnant again at 18 weeks and 4 days with due date 03/28/2013. Pregnancy is proceeding smoothly. The platelet count in our office today is 108,000.  There is some question from previous evaluations that she has a borderline enlarged spleen. Most recent ultrasound done just yesterday on June 24 shows spleen size stable  to decreased compared with previous ultrasounds with current measurement 12.5 x 4.4 x 15 cm volume 429 mL.   Medications: reviewed  Allergies:  Allergies  Allergen Reactions  . Zoloft (Sertraline Hcl) Hives  . Sulfa Antibiotics Hives    Review of Systems: Constitutional:   No constitutional symptoms Respiratory: No cough or dyspnea Cardiovascular:  No palpitations Gastrointestinal: No abdominal symptoms Genito-Urinary: No GU symptoms Musculoskeletal: No polymyalgia or polyarthralgia Neurologic: No headache Skin: No rash or ecchymosis Remaining ROS negative.  Physical Exam: Blood pressure 101/60, pulse 87, temperature 98.4 F (36.9 C), temperature source Oral, resp. rate 18, height 5\' 3"  (1.6 m), weight 139 lb 1.6 oz (63.095 kg), last menstrual period 06/21/2012. Wt Readings from Last 3 Encounters:  10/30/12 139 lb 1.6 oz (63.095 kg)  10/29/12 138 lb 8 oz (62.823 kg)  10/22/12 139 lb (63.05 kg)     General appearance: Healthy appearing Caucasian woman HENNT: Pharynx no erythema or exudate Lymph nodes: No adenopathy Breasts: Lungs: Clear to auscultation resonant to percussion Heart: Regular rhythm no murmur gallop or rub Abdomen: Soft, 18 week pregnancy, I was unable to palpate an enlarged spleen Extremities: No edema, no calf tenderness Musculoskeletal: No joint deformities GU: Vascular: No carotid bruits, no cyanosis Neurologic: Mental status intact, cranial nerves intact, PERRLA, optic disc sharp on the left not well visualized on the right, no hemorrhage or exudate, motor strength 5 over 5, reflexes 1+ symmetric, cranial nerves grossly normal, Skin: No rash or ecchymosis  Lab Results: Lab Results  Component Value Date   WBC 5.2 10/30/2012   HGB 11.0* 10/30/2012   HCT 32.9* 10/30/2012   MCV  88.9 10/30/2012   PLT 108* 10/30/2012     Chemistry      Component Value Date/Time   NA 140 10/30/2012 1111   NA 134* 12/30/2011 1436   K 3.7 10/30/2012 1111   K 3.9 12/30/2011  1436   CL 109* 10/30/2012 1111   CL 99 12/30/2011 1436   CO2 23 10/30/2012 1111   CO2 21 12/30/2011 1436   BUN 4.8* 10/30/2012 1111   BUN 6 12/30/2011 1436   CREATININE 0.6 10/30/2012 1111   CREATININE 0.43* 12/30/2011 1436      Component Value Date/Time   CALCIUM 9.1 10/30/2012 1111   CALCIUM 9.0 12/30/2011 1436   ALKPHOS 70 10/30/2012 1111   ALKPHOS 86 12/30/2011 1436   AST 10 10/30/2012 1111   AST 12 12/30/2011 1436   ALT <6 Repeated and Verified 10/30/2012 1111   ALT 10 12/30/2011 1436   BILITOT 0.29 10/30/2012 1111   BILITOT 0.5 12/30/2011 1436       Radiological Studies: US Ob Detail + 14 Wk  10/29/2012   OBSTETRICAL ULTRASOUND: This exam was performed within a [facility]. The OB US report was generated in the [facility] system, and faxed to the ordering physician.   This report is also available in [employer] and in the [employer]. See [facility] Obstetric US report.  US Abdomen Limited  10/11/2012   *RADIOLOGY REPORT*  Clinical Data: Splenomegaly  ABDOMEN ULTRASOUND LIMITED  Technique: Imaging of the spleen was performed.  Comparison:  01/19/2012  Findings: The spleen is 12.5 x 4.4 x 15.1 cm for a volume of 429 ml.  IMPRESSION: Borderline splenomegaly.  The spleen has reduced in size compared the prior study. The volume today is 429 ml.  The previous volume was 513 ml.   Original Report Authenticated By: Jolaine Click, M.D.    Impression: #1. Chronic ITP Typical flareups during pregnancies. We don't have any intervening blood counts since when she is not pregnant she is healthy and doesn't come in for blood counts.  Recommendation: There is no need for any therapy at this time. I will put her on a lab monitoring schedule of every 2 weeks for the duration of the pregnancy. No treatment indicated unless platelet count gets down to 30,000 or below. Given her poor tolerance of steroids and the potential complications that she had in the past with gestational  diabetes, I think we can likely avoid steroids altogether and just use intermittent IVIG if necessary.  She will be getting a dose of RhoGAM at 20 weeks. This product has actually been used to treat ITP in patients who still have their spleens and works as well as IVIG and is much easier to administer given over just 20 minutes instead of 4-6 hours. I will be interested in seeing what her platelet count is after she gets the RhoGAM. We might be able to use this as an alternative with additional doses for the remainder of the pregnancy if needed. We discussed potential side effects of the RhoGAM which can potentially cause hemolysis.  #2. Borderline splenomegaly. No evidence that she has an underlying myeloproliferative disorder. This is likely a variation of normal.  #3. Rh- status See discussion above   CC:. Dr. Mitchel Honour, [facility]    Levert Feinstein, MD 6/25/20141:09 PM

## 2012-10-30 NOTE — Progress Notes (Signed)
Checked in new patient with no financial issues. She wants all communication via email. Didn't ask about living will/POA.

## 2012-11-11 ENCOUNTER — Other Ambulatory Visit: Payer: Medicaid Other | Admitting: Lab

## 2012-11-11 ENCOUNTER — Ambulatory Visit: Payer: Medicaid Other

## 2012-11-12 ENCOUNTER — Other Ambulatory Visit (HOSPITAL_COMMUNITY): Payer: Self-pay | Admitting: Obstetrics & Gynecology

## 2012-11-12 ENCOUNTER — Other Ambulatory Visit (HOSPITAL_BASED_OUTPATIENT_CLINIC_OR_DEPARTMENT_OTHER): Payer: Medicaid Other | Admitting: Lab

## 2012-11-12 DIAGNOSIS — D693 Immune thrombocytopenic purpura: Secondary | ICD-10-CM

## 2012-11-12 DIAGNOSIS — O0990 Supervision of high risk pregnancy, unspecified, unspecified trimester: Secondary | ICD-10-CM

## 2012-11-12 LAB — CBC WITH DIFFERENTIAL/PLATELET
Basophils Absolute: 0 10*3/uL (ref 0.0–0.1)
Eosinophils Absolute: 0.1 10*3/uL (ref 0.0–0.5)
HGB: 10.5 g/dL — ABNORMAL LOW (ref 11.6–15.9)
LYMPH%: 27.7 % (ref 14.0–49.7)
MCV: 88.4 fL (ref 79.5–101.0)
MONO#: 0.4 10*3/uL (ref 0.1–0.9)
NEUT#: 4.3 10*3/uL (ref 1.5–6.5)
Platelets: 113 10*3/uL — ABNORMAL LOW (ref 145–400)
RBC: 3.49 10*6/uL — ABNORMAL LOW (ref 3.70–5.45)
RDW: 13.7 % (ref 11.2–14.5)
WBC: 6.7 10*3/uL (ref 3.9–10.3)

## 2012-11-14 ENCOUNTER — Encounter (HOSPITAL_COMMUNITY): Payer: Self-pay

## 2012-11-14 ENCOUNTER — Ambulatory Visit (HOSPITAL_COMMUNITY)
Admission: RE | Admit: 2012-11-14 | Discharge: 2012-11-14 | Disposition: A | Discharge status: Home / Self Care | Payer: Medicaid Other | Source: Ambulatory Visit | Attending: Obstetrics & Gynecology | Admitting: Obstetrics & Gynecology

## 2012-11-14 VITALS — BP 99/59 | HR 95 | Wt 140.5 lb

## 2012-11-14 DIAGNOSIS — O09212 Supervision of pregnancy with history of pre-term labor, second trimester: Secondary | ICD-10-CM

## 2012-11-14 DIAGNOSIS — D696 Thrombocytopenia, unspecified: Secondary | ICD-10-CM | POA: Insufficient documentation

## 2012-11-14 DIAGNOSIS — O09299 Supervision of pregnancy with other poor reproductive or obstetric history, unspecified trimester: Secondary | ICD-10-CM | POA: Insufficient documentation

## 2012-11-14 DIAGNOSIS — O99119 Other diseases of the blood and blood-forming organs and certain disorders involving the immune mechanism complicating pregnancy, unspecified trimester: Secondary | ICD-10-CM | POA: Insufficient documentation

## 2012-11-14 DIAGNOSIS — D689 Coagulation defect, unspecified: Secondary | ICD-10-CM | POA: Insufficient documentation

## 2012-11-14 DIAGNOSIS — Z8751 Personal history of pre-term labor: Secondary | ICD-10-CM | POA: Insufficient documentation

## 2012-11-26 ENCOUNTER — Other Ambulatory Visit (HOSPITAL_COMMUNITY): Payer: Self-pay | Admitting: Maternal and Fetal Medicine

## 2012-11-26 ENCOUNTER — Other Ambulatory Visit: Payer: Medicaid Other

## 2012-11-26 ENCOUNTER — Other Ambulatory Visit: Payer: Self-pay | Admitting: *Deleted

## 2012-11-26 DIAGNOSIS — O09212 Supervision of pregnancy with history of pre-term labor, second trimester: Secondary | ICD-10-CM

## 2012-11-28 ENCOUNTER — Telehealth: Payer: Self-pay | Admitting: Oncology

## 2012-11-28 ENCOUNTER — Ambulatory Visit (HOSPITAL_COMMUNITY)
Admission: RE | Admit: 2012-11-28 | Discharge: 2012-11-28 | Disposition: A | Discharge status: Home / Self Care | Payer: Medicaid Other | Source: Ambulatory Visit | Attending: Obstetrics & Gynecology | Admitting: Obstetrics & Gynecology

## 2012-11-28 VITALS — BP 105/64 | HR 104 | Wt 143.5 lb

## 2012-11-28 DIAGNOSIS — O99119 Other diseases of the blood and blood-forming organs and certain disorders involving the immune mechanism complicating pregnancy, unspecified trimester: Secondary | ICD-10-CM | POA: Insufficient documentation

## 2012-11-28 DIAGNOSIS — Z8751 Personal history of pre-term labor: Secondary | ICD-10-CM | POA: Insufficient documentation

## 2012-11-28 DIAGNOSIS — O09299 Supervision of pregnancy with other poor reproductive or obstetric history, unspecified trimester: Secondary | ICD-10-CM | POA: Insufficient documentation

## 2012-11-28 DIAGNOSIS — D693 Immune thrombocytopenic purpura: Secondary | ICD-10-CM | POA: Insufficient documentation

## 2012-11-28 DIAGNOSIS — O09212 Supervision of pregnancy with history of pre-term labor, second trimester: Secondary | ICD-10-CM

## 2012-11-28 DIAGNOSIS — D689 Coagulation defect, unspecified: Secondary | ICD-10-CM | POA: Insufficient documentation

## 2012-11-28 NOTE — Telephone Encounter (Signed)
LMONVM ADVIISNG THE PT THAT SHE HAD MISSED HER LAB APPT AND IF SHOULD COULD CALL ME BACK TO R/S IF SHE DID NOT WANT TO KEEP THAT AUG LAB APPT.

## 2012-11-28 NOTE — Progress Notes (Signed)
Shelly Andrews  was seen today for an ultrasound appointment.  See full report in [facility].  Impression: Single IUP at 22 6/7 weeks Follow up for growth, cervical length - hx of 35 week delivery, ITP and previous IUFD at 31 weeks Mild left-sided renal pylectasis was noted (5.8 mm) without calyceal dilation.  The right kidney appears normal. Otherwise normal interval anatomy Fetal growth is appropriate (66th %tile) Normal amniotic fluid volume  TVUS - cervical length 3.8 cm without funneling or dynamic changes  Recommendations: Recommend follow-up ultrasound examination in 2 weeks for cervical length Follow up in 6 weeks to reevaluate the fetal kidneys.  Alpha Gula, MD

## 2012-11-29 ENCOUNTER — Other Ambulatory Visit (HOSPITAL_BASED_OUTPATIENT_CLINIC_OR_DEPARTMENT_OTHER): Payer: Medicaid Other | Admitting: Lab

## 2012-11-29 DIAGNOSIS — D693 Immune thrombocytopenic purpura: Secondary | ICD-10-CM

## 2012-11-29 DIAGNOSIS — O0990 Supervision of high risk pregnancy, unspecified, unspecified trimester: Secondary | ICD-10-CM

## 2012-11-29 LAB — CBC WITH DIFFERENTIAL/PLATELET
BASO%: 0.2 % (ref 0.0–2.0)
Eosinophils Absolute: 0.1 10*3/uL (ref 0.0–0.5)
HCT: 31.4 % — ABNORMAL LOW (ref 34.8–46.6)
LYMPH%: 20.4 % (ref 14.0–49.7)
MCHC: 33.8 g/dL (ref 31.5–36.0)
MONO#: 0.5 10*3/uL (ref 0.1–0.9)
NEUT#: 5.5 10*3/uL (ref 1.5–6.5)
NEUT%: 72.6 % (ref 38.4–76.8)
Platelets: 107 10*3/uL — ABNORMAL LOW (ref 145–400)
RBC: 3.58 10*6/uL — ABNORMAL LOW (ref 3.70–5.45)
WBC: 7.6 10*3/uL (ref 3.9–10.3)
lymph#: 1.6 10*3/uL (ref 0.9–3.3)

## 2012-12-10 ENCOUNTER — Encounter: Payer: Self-pay | Admitting: Obstetrics and Gynecology

## 2012-12-10 ENCOUNTER — Other Ambulatory Visit (HOSPITAL_BASED_OUTPATIENT_CLINIC_OR_DEPARTMENT_OTHER): Payer: Medicaid Other | Admitting: Lab

## 2012-12-10 DIAGNOSIS — O0990 Supervision of high risk pregnancy, unspecified, unspecified trimester: Secondary | ICD-10-CM

## 2012-12-10 DIAGNOSIS — D693 Immune thrombocytopenic purpura: Secondary | ICD-10-CM

## 2012-12-10 LAB — CBC WITH DIFFERENTIAL/PLATELET
Basophils Absolute: 0 10*3/uL (ref 0.0–0.1)
EOS%: 0.9 % (ref 0.0–7.0)
HGB: 10.4 g/dL — ABNORMAL LOW (ref 11.6–15.9)
MCH: 29.2 pg (ref 25.1–34.0)
MCV: 87.3 fL (ref 79.5–101.0)
MONO%: 6.2 % (ref 0.0–14.0)
RBC: 3.56 10*6/uL — ABNORMAL LOW (ref 3.70–5.45)
RDW: 14.1 % (ref 11.2–14.5)

## 2012-12-12 ENCOUNTER — Ambulatory Visit (HOSPITAL_COMMUNITY)
Admission: RE | Admit: 2012-12-12 | Discharge: 2012-12-12 | Disposition: A | Discharge status: Home / Self Care | Payer: Medicaid Other | Source: Ambulatory Visit | Attending: Obstetrics and Gynecology | Admitting: Obstetrics and Gynecology

## 2012-12-12 VITALS — BP 103/64 | HR 85 | Wt 144.5 lb

## 2012-12-12 DIAGNOSIS — Z8751 Personal history of pre-term labor: Secondary | ICD-10-CM | POA: Insufficient documentation

## 2012-12-12 DIAGNOSIS — D693 Immune thrombocytopenic purpura: Secondary | ICD-10-CM | POA: Insufficient documentation

## 2012-12-12 DIAGNOSIS — O09212 Supervision of pregnancy with history of pre-term labor, second trimester: Secondary | ICD-10-CM

## 2012-12-12 DIAGNOSIS — O09299 Supervision of pregnancy with other poor reproductive or obstetric history, unspecified trimester: Secondary | ICD-10-CM | POA: Insufficient documentation

## 2012-12-12 DIAGNOSIS — D689 Coagulation defect, unspecified: Secondary | ICD-10-CM | POA: Insufficient documentation

## 2012-12-12 NOTE — Progress Notes (Signed)
Shelly Andrews  was seen today for an ultrasound appointment.  See full report in [facility].  Impression: Single IUP at 24 6/7 weeks Follow up for  cervical length - hx of 35 week delivery, ITP and previous IUFD at 31 weeks  TVUS - cervical length 4 cm without funneling or dynamic changes  Recommendations: Follow up in 4 weeks for growth and to reevaluate the fetal kidneys.  Alpha Gula, MD

## 2012-12-31 ENCOUNTER — Other Ambulatory Visit (HOSPITAL_BASED_OUTPATIENT_CLINIC_OR_DEPARTMENT_OTHER): Payer: Medicaid Other | Admitting: Lab

## 2012-12-31 ENCOUNTER — Telehealth: Payer: Self-pay | Admitting: Oncology

## 2012-12-31 ENCOUNTER — Ambulatory Visit (HOSPITAL_BASED_OUTPATIENT_CLINIC_OR_DEPARTMENT_OTHER): Payer: Medicaid Other | Admitting: Oncology

## 2012-12-31 VITALS — BP 96/63 | HR 116 | Temp 97.9°F | Resp 19 | Ht 63.0 in | Wt 147.1 lb

## 2012-12-31 DIAGNOSIS — E538 Deficiency of other specified B group vitamins: Secondary | ICD-10-CM

## 2012-12-31 DIAGNOSIS — D693 Immune thrombocytopenic purpura: Secondary | ICD-10-CM

## 2012-12-31 DIAGNOSIS — O0990 Supervision of high risk pregnancy, unspecified, unspecified trimester: Secondary | ICD-10-CM

## 2012-12-31 LAB — CBC WITH DIFFERENTIAL/PLATELET
BASO%: 0.2 % (ref 0.0–2.0)
EOS%: 1.1 % (ref 0.0–7.0)
Eosinophils Absolute: 0.1 10*3/uL (ref 0.0–0.5)
MCH: 28.1 pg (ref 25.1–34.0)
MCV: 87.8 fL (ref 79.5–101.0)
MONO%: 5.4 % (ref 0.0–14.0)
NEUT#: 4.5 10*3/uL (ref 1.5–6.5)
RBC: 3.6 10*6/uL — ABNORMAL LOW (ref 3.70–5.45)
RDW: 14.3 % (ref 11.2–14.5)
nRBC: 0 % (ref 0–0)

## 2012-12-31 NOTE — Telephone Encounter (Signed)
gv pt appt schedule for September and October. Message to [facility] to see if pt needs to continue q2w standing lbs. Pt aware if she does I will add lbs and contact her.

## 2013-01-02 NOTE — Progress Notes (Signed)
Hematology and Oncology Follow Up Visit  Shelly Andrews [id number] April 19, 1981 32 y.o. 01/02/2013 6:12 PM   Principle Diagnosis: Encounter Diagnoses  Name Primary?  . Low vitamin B12 level   . Pregnancy, supervision of, high-risk, unspecified trimester   . ITP (idiopathic thrombocytopenic purpura) Yes     Interim History:   Short interim followup visit is 38-year-old woman with chronic ITP which typically flares up during pregnancy. She is now pregnant again. Due date is November 21. Platelet count has been stable around 100,000. A little bit lower today at 94,000. I was interested in seeing the effect of RhoGAM since this is an active treatment for ITP in non-splenectomized patients. She has not received any RhoGAM yet but will get a dose next week. Pregnancy otherwise progressing smoothly. She denies any dyspnea, chest pain, palpitations, leg pain or swelling.  Medications: reviewed  Allergies:  Allergies  Allergen Reactions  . Zoloft [Sertraline Hcl] Hives  . Sulfa Antibiotics Hives    Review of Systems: See history of present illness Remaining ROS negative.  Physical Exam: Blood pressure 96/63, pulse 116, temperature 97.9 F (36.6 C), temperature source Oral, resp. rate 19, height 5\' 3"  (1.6 m), weight 147 lb 1.6 oz (66.724 kg), last menstrual period 06/21/2012. Wt Readings from Last 3 Encounters:  12/31/12 147 lb 1.6 oz (66.724 kg)  12/12/12 144 lb 8 oz (65.545 kg)  11/28/12 143 lb 8 oz (65.091 kg)     General appearance: Well-nourished Caucasian woman HENNT: Pharynx no erythema or exudate Lymph nodes:  Breasts: Lungs: Clear to auscultation resonant to percussion Heart: Regular rhythm no murmur Abdomen: Third trimester pregnancy Extremities: No edema, no calf tenderness Musculoskeletal: GU: Vascular: Neurologic: Motor strength 5 over 5, reflexes 1+ symmetric, PERRLA, optic disc sharp and vessels normal Skin:  Lab Results: Lab Results  Component Value Date    WBC 6.5 12/31/2012   HGB 10.1* 12/31/2012   HCT 31.6* 12/31/2012   MCV 87.8 12/31/2012   PLT 94* 12/31/2012     Chemistry      Component Value Date/Time   NA 140 10/30/2012 1111   NA 134* 12/30/2011 1436   K 3.7 10/30/2012 1111   K 3.9 12/30/2011 1436   CL 109* 10/30/2012 1111   CL 99 12/30/2011 1436   CO2 23 10/30/2012 1111   CO2 21 12/30/2011 1436   BUN 4.8* 10/30/2012 1111   BUN 6 12/30/2011 1436   CREATININE 0.6 10/30/2012 1111   CREATININE 0.43* 12/30/2011 1436      Component Value Date/Time   CALCIUM 9.1 10/30/2012 1111   CALCIUM 9.0 12/30/2011 1436   ALKPHOS 70 10/30/2012 1111   ALKPHOS 86 12/30/2011 1436   AST 10 10/30/2012 1111   AST 12 12/30/2011 1436   ALT <6 Repeated and Verified 10/30/2012 1111   ALT 10 12/30/2011 1436   BILITOT 0.29 10/30/2012 1111   BILITOT 0.5 12/30/2011 1436       Radiological Studies: US Ob Transvaginal  12/12/2012   OBSTETRICAL ULTRASOUND: This exam was performed within a [facility]. The OB US report was generated in the AS system, and faxed to the ordering physician.   This report is also available in [employer] and in the [employer]. See [facility] Obstetric US report.   Impression: ITP in pregnancy. Clinically stable. I will see her again next month. Continue every 2 weeks CBCs. Increase to weekly near term. Assessment response to RhoGAM. If good response, we may give her another dose  prior to delivery date. It would also be safe to start prednisone if there is a further fall in her platelet count. I usually start at 20 mg daily and go up from there.   CC:.    Levert Feinstein, MD 8/28/20146:12 PM

## 2013-01-03 ENCOUNTER — Other Ambulatory Visit: Payer: Self-pay | Admitting: Oncology

## 2013-01-09 ENCOUNTER — Encounter (HOSPITAL_COMMUNITY): Payer: Self-pay

## 2013-01-09 ENCOUNTER — Ambulatory Visit (HOSPITAL_COMMUNITY)
Admission: RE | Admit: 2013-01-09 | Discharge: 2013-01-09 | Disposition: A | Discharge status: Home / Self Care | Payer: Medicaid Other | Source: Ambulatory Visit | Attending: Obstetrics and Gynecology | Admitting: Obstetrics and Gynecology

## 2013-01-09 DIAGNOSIS — Z8751 Personal history of pre-term labor: Secondary | ICD-10-CM | POA: Insufficient documentation

## 2013-01-09 DIAGNOSIS — Z1389 Encounter for screening for other disorder: Secondary | ICD-10-CM | POA: Insufficient documentation

## 2013-01-09 DIAGNOSIS — D689 Coagulation defect, unspecified: Secondary | ICD-10-CM | POA: Insufficient documentation

## 2013-01-09 DIAGNOSIS — Z363 Encounter for antenatal screening for malformations: Secondary | ICD-10-CM | POA: Insufficient documentation

## 2013-01-09 DIAGNOSIS — O3660X0 Maternal care for excessive fetal growth, unspecified trimester, not applicable or unspecified: Secondary | ICD-10-CM | POA: Insufficient documentation

## 2013-01-09 DIAGNOSIS — D693 Immune thrombocytopenic purpura: Secondary | ICD-10-CM

## 2013-01-09 DIAGNOSIS — O09299 Supervision of pregnancy with other poor reproductive or obstetric history, unspecified trimester: Secondary | ICD-10-CM | POA: Insufficient documentation

## 2013-01-09 DIAGNOSIS — O09212 Supervision of pregnancy with history of pre-term labor, second trimester: Secondary | ICD-10-CM

## 2013-01-09 DIAGNOSIS — O358XX0 Maternal care for other (suspected) fetal abnormality and damage, not applicable or unspecified: Secondary | ICD-10-CM | POA: Insufficient documentation

## 2013-01-09 NOTE — Progress Notes (Signed)
Shelly Andrews  was seen today for an ultrasound appointment.  See full report in [facility].  Impression: Single IUP at 28 6/7 weeks Follow up due to  hx of 35 week delivery, ITP and previous IUFD at 31 weeks and recently diagnosed gestational diabetes Mild / borderline renal pylectasis noted (5 mm bilaterally).  No calyceal dilation noted. Otherwise normal interval anatomy Fetal growth is appropriate (75th %tile); the [facility] measures at the 92nd %tile Subjectively increased amniotic fluid (AFI 23.3 cm)  Recommendations: Recommend follow up for interval growth and reevalaution of the fetal kidneys in 4 weeks. Recommend 2x NSTs with weekly AFIs begining at 32 weeks.  Alpha Gula, MD

## 2013-01-15 ENCOUNTER — Encounter: Payer: Medicaid Other | Attending: Obstetrics & Gynecology

## 2013-01-15 VITALS — Ht 63.0 in | Wt 146.2 lb

## 2013-01-15 DIAGNOSIS — O24419 Gestational diabetes mellitus in pregnancy, unspecified control: Secondary | ICD-10-CM

## 2013-01-15 DIAGNOSIS — Z713 Dietary counseling and surveillance: Secondary | ICD-10-CM | POA: Insufficient documentation

## 2013-01-15 DIAGNOSIS — O99810 Abnormal glucose complicating pregnancy: Secondary | ICD-10-CM | POA: Insufficient documentation

## 2013-01-16 NOTE — Progress Notes (Signed)
  Patient was seen on 01/15/13 for Gestational Diabetes self-management class at the [facility]. The following learning objectives were met by the patient during this course:   States the definition of Gestational Diabetes  States why dietary management is important in controlling blood glucose  Describes the effects each nutrient has on blood glucose levels  Demonstrates ability to create a balanced meal plan  Demonstrates carbohydrate counting   States when to check blood glucose levels  Demonstrates proper blood glucose monitoring techniques  States the effect of stress and exercise on blood glucose levels  States the importance of limiting caffeine and abstaining from alcohol and smoking  Blood glucose monitor given: [employer] Nano BG Monitoring Kit Lot # [id number] Exp: 04/06/14 Blood glucose reading: 77  Patient instructed to monitor glucose levels: FBS: 60 - <90 1 hour: <140 2 hour: <120  *Patient received handouts:  Nutrition Diabetes and Pregnancy  Carbohydrate Counting List  Meal plan work sheet  Patient will be seen for follow-up as needed.

## 2013-01-21 ENCOUNTER — Other Ambulatory Visit: Payer: Medicaid Other

## 2013-02-04 ENCOUNTER — Other Ambulatory Visit (HOSPITAL_COMMUNITY): Payer: Self-pay | Admitting: Obstetrics and Gynecology

## 2013-02-04 ENCOUNTER — Other Ambulatory Visit (HOSPITAL_BASED_OUTPATIENT_CLINIC_OR_DEPARTMENT_OTHER): Payer: Medicaid Other

## 2013-02-04 DIAGNOSIS — D693 Immune thrombocytopenic purpura: Secondary | ICD-10-CM

## 2013-02-04 DIAGNOSIS — E538 Deficiency of other specified B group vitamins: Secondary | ICD-10-CM

## 2013-02-04 DIAGNOSIS — O0990 Supervision of high risk pregnancy, unspecified, unspecified trimester: Secondary | ICD-10-CM

## 2013-02-04 LAB — CBC WITH DIFFERENTIAL/PLATELET
BASO%: 0.3 % (ref 0.0–2.0)
Basophils Absolute: 0 10*3/uL (ref 0.0–0.1)
EOS%: 0.7 % (ref 0.0–7.0)
HCT: 33 % — ABNORMAL LOW (ref 34.8–46.6)
HGB: 11 g/dL — ABNORMAL LOW (ref 11.6–15.9)
MCH: 28.1 pg (ref 25.1–34.0)
MCHC: 33.2 g/dL (ref 31.5–36.0)
MONO#: 0.5 10*3/uL (ref 0.1–0.9)
NEUT%: 73 % (ref 38.4–76.8)
RDW: 15.7 % — ABNORMAL HIGH (ref 11.2–14.5)
WBC: 7.5 10*3/uL (ref 3.9–10.3)
lymph#: 1.4 10*3/uL (ref 0.9–3.3)

## 2013-02-06 ENCOUNTER — Ambulatory Visit (HOSPITAL_COMMUNITY)
Admission: RE | Admit: 2013-02-06 | Discharge: 2013-02-06 | Disposition: A | Discharge status: Home / Self Care | Payer: Medicaid Other | Source: Ambulatory Visit | Attending: Obstetrics and Gynecology | Admitting: Obstetrics and Gynecology

## 2013-02-06 DIAGNOSIS — O09299 Supervision of pregnancy with other poor reproductive or obstetric history, unspecified trimester: Secondary | ICD-10-CM | POA: Insufficient documentation

## 2013-02-06 DIAGNOSIS — Z363 Encounter for antenatal screening for malformations: Secondary | ICD-10-CM | POA: Insufficient documentation

## 2013-02-06 DIAGNOSIS — Z1389 Encounter for screening for other disorder: Secondary | ICD-10-CM | POA: Insufficient documentation

## 2013-02-06 DIAGNOSIS — D689 Coagulation defect, unspecified: Secondary | ICD-10-CM | POA: Insufficient documentation

## 2013-02-06 DIAGNOSIS — D693 Immune thrombocytopenic purpura: Secondary | ICD-10-CM

## 2013-02-06 DIAGNOSIS — O99810 Abnormal glucose complicating pregnancy: Secondary | ICD-10-CM | POA: Insufficient documentation

## 2013-02-06 DIAGNOSIS — O3660X0 Maternal care for excessive fetal growth, unspecified trimester, not applicable or unspecified: Secondary | ICD-10-CM | POA: Insufficient documentation

## 2013-02-06 DIAGNOSIS — Z8751 Personal history of pre-term labor: Secondary | ICD-10-CM | POA: Insufficient documentation

## 2013-02-06 DIAGNOSIS — O358XX0 Maternal care for other (suspected) fetal abnormality and damage, not applicable or unspecified: Secondary | ICD-10-CM | POA: Insufficient documentation

## 2013-02-17 ENCOUNTER — Other Ambulatory Visit: Payer: Self-pay | Admitting: Oncology

## 2013-02-17 DIAGNOSIS — O0993 Supervision of high risk pregnancy, unspecified, third trimester: Secondary | ICD-10-CM

## 2013-02-17 DIAGNOSIS — D693 Immune thrombocytopenic purpura: Secondary | ICD-10-CM

## 2013-02-18 ENCOUNTER — Other Ambulatory Visit (HOSPITAL_BASED_OUTPATIENT_CLINIC_OR_DEPARTMENT_OTHER): Payer: Medicaid Other

## 2013-02-18 ENCOUNTER — Telehealth: Payer: Self-pay | Admitting: *Deleted

## 2013-02-18 ENCOUNTER — Telehealth: Payer: Self-pay | Admitting: Oncology

## 2013-02-18 DIAGNOSIS — D693 Immune thrombocytopenic purpura: Secondary | ICD-10-CM

## 2013-02-18 DIAGNOSIS — O0993 Supervision of high risk pregnancy, unspecified, third trimester: Secondary | ICD-10-CM

## 2013-02-18 LAB — CBC WITH DIFFERENTIAL/PLATELET
Basophils Absolute: 0 10*3/uL (ref 0.0–0.1)
Eosinophils Absolute: 0 10*3/uL (ref 0.0–0.5)
HGB: 11.1 g/dL — ABNORMAL LOW (ref 11.6–15.9)
MCV: 85.7 fL (ref 79.5–101.0)
MONO#: 0.5 10*3/uL (ref 0.1–0.9)
MONO%: 6.7 % (ref 0.0–14.0)
NEUT#: 5.8 10*3/uL (ref 1.5–6.5)
RBC: 3.93 10*6/uL (ref 3.70–5.45)
RDW: 17.7 % — ABNORMAL HIGH (ref 11.2–14.5)
WBC: 8.1 10*3/uL (ref 3.9–10.3)
lymph#: 1.6 10*3/uL (ref 0.9–3.3)

## 2013-02-18 NOTE — Telephone Encounter (Signed)
Gave pt appt for labs and MD for October 2014

## 2013-02-18 NOTE — Telephone Encounter (Signed)
Per Dr. Cyndie Chime; notified pt platelets last week 40 & MD wants to check labs weekly until delivery.  Pt verbalized understanding and confirmed lab appt today @ 12:15.  Pt also verbalized understanding to go to scheduling to set-up weekly lab appts.  POF completed.

## 2013-02-18 NOTE — Telephone Encounter (Signed)
Message copied by Gala Romney on Tue Feb 18, 2013 11:20 AM ------      Message from: Levert Feinstein      Created: Mon Feb 17, 2013  7:39 PM       Call pt: platelets were 78,000 last week - I would like to check on a weekly basis from now until delivery. Can she come in 10/14 or 15 for lab? ------

## 2013-02-20 ENCOUNTER — Telehealth: Payer: Self-pay | Admitting: *Deleted

## 2013-02-20 NOTE — Telephone Encounter (Signed)
Message copied by Gala Romney on Thu Feb 20, 2013  4:34 PM ------      Message from: Levert Feinstein      Created: Wed Feb 19, 2013  6:13 PM       Call pt platelet count 75,000  This is OK for now ------

## 2013-02-20 NOTE — Telephone Encounter (Signed)
Per Dr. Cyndie Chime; left vm platelets 75--this is OK for now. Call office if questions or problems.

## 2013-02-25 ENCOUNTER — Other Ambulatory Visit: Payer: Medicaid Other

## 2013-02-26 ENCOUNTER — Other Ambulatory Visit (HOSPITAL_BASED_OUTPATIENT_CLINIC_OR_DEPARTMENT_OTHER): Payer: Medicaid Other | Admitting: Lab

## 2013-02-26 DIAGNOSIS — O0993 Supervision of high risk pregnancy, unspecified, third trimester: Secondary | ICD-10-CM

## 2013-02-26 DIAGNOSIS — D693 Immune thrombocytopenic purpura: Secondary | ICD-10-CM

## 2013-02-26 LAB — CBC WITH DIFFERENTIAL/PLATELET
Basophils Absolute: 0 10*3/uL (ref 0.0–0.1)
Eosinophils Absolute: 0 10*3/uL (ref 0.0–0.5)
HCT: 35 % (ref 34.8–46.6)
HGB: 11.3 g/dL — ABNORMAL LOW (ref 11.6–15.9)
LYMPH%: 22.6 % (ref 14.0–49.7)
MCV: 86.8 fL (ref 79.5–101.0)
MONO#: 0.5 10*3/uL (ref 0.1–0.9)
MONO%: 7.7 % (ref 0.0–14.0)
NEUT#: 4.8 10*3/uL (ref 1.5–6.5)
NEUT%: 68.9 % (ref 38.4–76.8)
Platelets: 68 10*3/uL — ABNORMAL LOW (ref 145–400)
RBC: 4.03 10*6/uL (ref 3.70–5.45)
WBC: 6.9 10*3/uL (ref 3.9–10.3)

## 2013-02-27 ENCOUNTER — Telehealth: Payer: Self-pay | Admitting: *Deleted

## 2013-02-27 ENCOUNTER — Other Ambulatory Visit (HOSPITAL_COMMUNITY): Payer: Self-pay | Admitting: Obstetrics and Gynecology

## 2013-02-27 DIAGNOSIS — D693 Immune thrombocytopenic purpura: Secondary | ICD-10-CM

## 2013-02-27 NOTE — Telephone Encounter (Signed)
Per Dr. Cyndie Chime; notified pt platelets slowly drifting down..now 68,000 and MD wants her to start prednisone 20 mg daily.  Pt verbalized she is not comfortable starting the prednisone but would consider IVIG.  Confirmed appt 03/04/13 for lab and office visit and will discuss with MD at that time.  Note to Dr. Cyndie Chime.

## 2013-02-27 NOTE — Telephone Encounter (Signed)
Message copied by Gala Romney on Thu Feb 27, 2013  4:00 PM ------      Message from: Levert Feinstein      Created: Wed Feb 26, 2013  5:41 PM       Call pt platelets 68,000; slowly drifting down.  I would like her to start prednisone 20 mg daily; continue Q wk lab ------

## 2013-03-01 ENCOUNTER — Inpatient Hospital Stay (HOSPITAL_COMMUNITY)
Admission: AD | Admit: 2013-03-01 | Discharge: 2013-03-03 | DRG: 775 | Disposition: A | Discharge status: Home / Self Care | Payer: Medicaid Other | Source: Ambulatory Visit | Attending: Obstetrics and Gynecology | Admitting: Obstetrics and Gynecology

## 2013-03-01 ENCOUNTER — Encounter (HOSPITAL_COMMUNITY): Payer: Self-pay | Admitting: *Deleted

## 2013-03-01 DIAGNOSIS — D693 Immune thrombocytopenic purpura: Secondary | ICD-10-CM | POA: Diagnosis present

## 2013-03-01 DIAGNOSIS — D689 Coagulation defect, unspecified: Secondary | ICD-10-CM | POA: Diagnosis present

## 2013-03-01 LAB — OB RESULTS CONSOLE GBS: GBS: NEGATIVE

## 2013-03-01 LAB — CBC
MCH: 28.4 pg (ref 26.0–34.0)
MCV: 87.1 fL (ref 78.0–100.0)
Platelets: 65 10*3/uL — ABNORMAL LOW (ref 150–400)
RBC: 4.26 MIL/uL (ref 3.87–5.11)
RDW: 18.1 % — ABNORMAL HIGH (ref 11.5–15.5)
WBC: 9.7 10*3/uL (ref 4.0–10.5)

## 2013-03-01 MED ORDER — BUTORPHANOL TARTRATE 1 MG/ML IJ SOLN
1.0000 mg | INTRAMUSCULAR | Status: DC | PRN
  Administered 2013-03-01: 1 mg via INTRAVENOUS
  Filled 2013-03-01: qty 1

## 2013-03-01 MED ORDER — PROMETHAZINE HCL 25 MG/ML IJ SOLN: 12.5000 mg | Freq: Once | INTRAMUSCULAR | Status: DC

## 2013-03-01 MED ORDER — LACTATED RINGERS IV SOLN
INTRAVENOUS | Status: DC
  Administered 2013-03-01: 20:00:00 via INTRAVENOUS

## 2013-03-01 MED ORDER — ACETAMINOPHEN 325 MG PO TABS: 650.0000 mg | ORAL_TABLET | ORAL | Status: DC | PRN

## 2013-03-01 MED ORDER — OXYTOCIN BOLUS FROM INFUSION: 500.0000 mL | INTRAVENOUS | Status: DC

## 2013-03-01 MED ORDER — IBUPROFEN 600 MG PO TABS
600.0000 mg | ORAL_TABLET | Freq: Four times a day (QID) | ORAL | Status: DC | PRN
  Administered 2013-03-01: 600 mg via ORAL
  Filled 2013-03-01: qty 1

## 2013-03-01 MED ORDER — OXYCODONE-ACETAMINOPHEN 5-325 MG PO TABS
1.0000 | ORAL_TABLET | ORAL | Status: DC | PRN
Start: 2013-03-01 — End: 2013-03-01

## 2013-03-01 MED ORDER — LIDOCAINE HCL (PF) 1 % IJ SOLN
30.0000 mL | INTRAMUSCULAR | Status: DC | PRN
  Administered 2013-03-01: 30 mL via SUBCUTANEOUS
  Filled 2013-03-01 (×2): qty 30

## 2013-03-01 MED ORDER — LACTATED RINGERS IV SOLN: 500.0000 mL | INTRAVENOUS | Status: DC | PRN

## 2013-03-01 MED ORDER — LANOLIN HYDROUS EX OINT: TOPICAL_OINTMENT | CUTANEOUS | Status: DC | PRN

## 2013-03-01 MED ORDER — CITRIC ACID-SODIUM CITRATE 334-500 MG/5ML PO SOLN: 30.0000 mL | ORAL | Status: DC | PRN

## 2013-03-01 MED ORDER — PRENATAL MULTIVITAMIN CH
1.0000 | ORAL_TABLET | Freq: Every day | ORAL | Status: DC
  Administered 2013-03-02 – 2013-03-03 (×2): 1 via ORAL
  Filled 2013-03-01 (×2): qty 1

## 2013-03-01 MED ORDER — BISACODYL 10 MG RE SUPP: 10.0000 mg | Freq: Every day | RECTAL | Status: DC | PRN

## 2013-03-01 MED ORDER — ONDANSETRON HCL 4 MG/2ML IJ SOLN: 4.0000 mg | INTRAMUSCULAR | Status: DC | PRN

## 2013-03-01 MED ORDER — FLEET ENEMA 7-19 GM/118ML RE ENEM: 1.0000 | ENEMA | RECTAL | Status: DC | PRN

## 2013-03-01 MED ORDER — FLEET ENEMA 7-19 GM/118ML RE ENEM: 1.0000 | ENEMA | Freq: Every day | RECTAL | Status: DC | PRN

## 2013-03-01 MED ORDER — ONDANSETRON HCL 4 MG PO TABS: 4.0000 mg | ORAL_TABLET | ORAL | Status: DC | PRN

## 2013-03-01 MED ORDER — TETANUS-DIPHTH-ACELL PERTUSSIS 5-2.5-18.5 LF-MCG/0.5 IM SUSP: 0.5000 mL | Freq: Once | INTRAMUSCULAR | Status: DC

## 2013-03-01 MED ORDER — DIPHENHYDRAMINE HCL 25 MG PO CAPS: 25.0000 mg | ORAL_CAPSULE | Freq: Four times a day (QID) | ORAL | Status: DC | PRN

## 2013-03-01 MED ORDER — ONDANSETRON HCL 4 MG/2ML IJ SOLN: 4.0000 mg | Freq: Four times a day (QID) | INTRAMUSCULAR | Status: DC | PRN

## 2013-03-01 MED ORDER — OXYTOCIN 40 UNITS IN LACTATED RINGERS INFUSION - SIMPLE MED
62.5000 mL/h | INTRAVENOUS | Status: DC
  Administered 2013-03-01: 62.5 mL/h via INTRAVENOUS
  Filled 2013-03-01: qty 1000

## 2013-03-01 MED ORDER — BENZOCAINE-MENTHOL 20-0.5 % EX AERO
1.0000 "application " | INHALATION_SPRAY | CUTANEOUS | Status: DC | PRN
  Administered 2013-03-02: 1 via TOPICAL
  Filled 2013-03-01: qty 56

## 2013-03-01 MED ORDER — SENNOSIDES-DOCUSATE SODIUM 8.6-50 MG PO TABS
2.0000 | ORAL_TABLET | ORAL | Status: DC
  Administered 2013-03-02: 2 via ORAL
  Filled 2013-03-01: qty 2

## 2013-03-01 MED ORDER — WITCH HAZEL-GLYCERIN EX PADS
1.0000 "application " | MEDICATED_PAD | CUTANEOUS | Status: DC | PRN
  Administered 2013-03-02: 1 via TOPICAL

## 2013-03-01 MED ORDER — IBUPROFEN 800 MG PO TABS: 800.0000 mg | ORAL_TABLET | Freq: Three times a day (TID) | ORAL | Status: DC | PRN

## 2013-03-01 MED ORDER — OXYCODONE-ACETAMINOPHEN 5-325 MG PO TABS
1.0000 | ORAL_TABLET | Freq: Four times a day (QID) | ORAL | Status: DC | PRN
  Administered 2013-03-02 – 2013-03-03 (×3): 1 via ORAL
  Filled 2013-03-01 (×3): qty 1

## 2013-03-01 MED ORDER — MEASLES, MUMPS & RUBELLA VAC ~~LOC~~ INJ
0.5000 mL | INJECTION | Freq: Once | SUBCUTANEOUS | Status: DC
  Filled 2013-03-01: qty 0.5

## 2013-03-01 MED ORDER — ZOLPIDEM TARTRATE 5 MG PO TABS: 5.0000 mg | ORAL_TABLET | Freq: Every evening | ORAL | Status: DC | PRN

## 2013-03-01 MED ORDER — SIMETHICONE 80 MG PO CHEW: 80.0000 mg | CHEWABLE_TABLET | ORAL | Status: DC | PRN

## 2013-03-01 MED ORDER — DIBUCAINE 1 % RE OINT: 1.0000 "application " | TOPICAL_OINTMENT | RECTAL | Status: DC | PRN

## 2013-03-01 NOTE — MAU Note (Signed)
I've had Braxton Hicks contractions off and on for weeks. Today contractions more intense to the point of tears. Had baby shower today and may have overdone it. Hx 2nd delivery 35wks and 31 wk stillbirth. Leaked some yellow fld earlier

## 2013-03-01 NOTE — H&P (Signed)
Shelly Andrews is a 32 y.o. female presenting for SOL/SROM. Maternal Medical History:  Reason for admission: Contractions.   Contractions: Onset was 3-5 hours ago.   Frequency: regular.   Perceived severity is moderate.    Fetal activity: Perceived fetal activity is normal.   Last perceived fetal movement was within the past hour.      OB History   Grav Para Term Preterm Abortions TAB SAB Ect Mult Living   4 3 1 2  0 0 0 0 0 2     Past Medical History  Diagnosis Date  . Thrombocytopenia     Idopathic with pregnancy  . No pertinent past medical history   . Pregnancy   . Depression   . Pregnancy, supervision of, high-risk 10/29/2012   Past Surgical History  Procedure Laterality Date  . No past surgeries     Family History: family history includes Diabetes in her maternal grandmother and mother. Social History:  reports that she has never smoked. She has never used smokeless tobacco. She reports that she does not drink alcohol or use illicit drugs.   Prenatal Transfer Tool  Maternal Diabetes: No Genetic Screening: Normal Maternal Ultrasounds/Referrals: Normal Fetal Ultrasounds or other Referrals:  None Maternal Substance Abuse:  No Significant Maternal Medications:  None Significant Maternal Lab Results:  HX ITP Other Comments:  None  ROS  Dilation: 4.5 Effacement (%): 80 Station: -2 Exam by:: Wilson,RN Blood pressure 108/71, pulse 107, temperature 98.6 F (37 C), resp. rate 22, height 5\' 3"  (1.6 m), weight 148 lb 3.2 oz (67.223 kg), last menstrual period 06/21/2012. Maternal Exam:  Uterine Assessment: Contraction strength is moderate.  Contraction frequency is regular.   Abdomen: Patient reports no abdominal tenderness. Fundal height is 35.   Estimated fetal weight is AGA.   Fetal presentation: vertex  Introitus: Normal vulva. Normal vagina.  Pelvis: adequate for delivery.   Cervix: Cervix evaluated by digital exam.     Physical Exam  Constitutional: She  is oriented to person, place, and time. She appears well-developed and well-nourished.  HENT:  Head: Normocephalic and atraumatic.  Neck: Normal range of motion. Neck supple.  Cardiovascular: Normal rate and regular rhythm.   Respiratory: Effort normal and breath sounds normal.  GI:  35 cm FH, FHR 148  Genitourinary:  4-5/75/vtx/clr AF  Musculoskeletal: Normal range of motion.  Neurological: She is alert and oriented to person, place, and time.    Prenatal labs: ABO, Rh: O/Negative/-- (04/22 0000) Antibody: Negative (04/22 0000) Rubella: Equivocal (04/22 0000) RPR: Nonreactive (04/22 0000)  HBsAg: NEGATIVE (05/09 1448)  HIV: NON REACTIVE (05/09 1448)  GBS: Negative (10/25 0000)   Assessment/Plan: [redacted]w[redacted]d Hx ITP, followed by Heme Hx GDM w/ prior preg   Shani Fitch M 03/01/2013, 8:37 PM

## 2013-03-02 LAB — CBC
HCT: 32.8 % — ABNORMAL LOW (ref 36.0–46.0)
Hemoglobin: 10.5 g/dL — ABNORMAL LOW (ref 12.0–15.0)
MCHC: 32.5 g/dL (ref 30.0–36.0)
MCHC: 32 g/dL (ref 30.0–36.0)
MCV: 88 fL (ref 78.0–100.0)
Platelets: 54 10*3/uL — ABNORMAL LOW (ref 150–400)
Platelets: 58 10*3/uL — ABNORMAL LOW (ref 150–400)
RBC: 3.57 MIL/uL — ABNORMAL LOW (ref 3.87–5.11)
RBC: 3.7 MIL/uL — ABNORMAL LOW (ref 3.87–5.11)
RDW: 18.1 % — ABNORMAL HIGH (ref 11.5–15.5)
WBC: 9.7 10*3/uL (ref 4.0–10.5)
WBC: 8 10*3/uL (ref 4.0–10.5)

## 2013-03-02 LAB — RPR: RPR Ser Ql: NONREACTIVE

## 2013-03-02 MED ORDER — HYDROCODONE-ACETAMINOPHEN 5-325 MG PO TABS
1.0000 | ORAL_TABLET | ORAL | Status: DC | PRN
  Administered 2013-03-02 – 2013-03-03 (×4): 1 via ORAL
  Filled 2013-03-02 (×4): qty 1

## 2013-03-02 MED ORDER — ACETAMINOPHEN 500 MG PO TABS
1000.0000 mg | ORAL_TABLET | Freq: Three times a day (TID) | ORAL | Status: DC | PRN
  Administered 2013-03-02: 1000 mg via ORAL
  Filled 2013-03-02: qty 2

## 2013-03-02 NOTE — Progress Notes (Signed)
Phone conv w/ Dr Darnelle Catalan, rec just obsv pt clinically for now, no active treatment if plts improve and no clinical probs w/ bleeding

## 2013-03-02 NOTE — Progress Notes (Signed)
Post Partum Day 1 Subjective: no complaints  Objective: Blood pressure 99/58, pulse 75, temperature 98 F (36.7 C), temperature source Oral, resp. rate 18, height 5\' 3"  (1.6 m), weight 148 lb 3.2 oz (67.223 kg), last menstrual period 06/21/2012, SpO2 97.00%, unknown if currently breastfeeding.  Physical Exam:  General: alert Lochia: appropriate Uterine Fundus: firm Incision: healing well DVT Evaluation: No evidence of DVT seen on physical exam.   Recent Labs  03/01/13 2005 03/02/13 0547  HGB 12.1 10.2*  HCT 37.1 31.4*    Assessment/Plan: Plan for discharge tomorrow, f/u CBC in am, will contact her Heme MD in am for recs/follow up   LOS: 1 day   [facility] M 03/02/2013, 9:43 AM

## 2013-03-02 NOTE — Progress Notes (Signed)
Attempted several times to visit with mother throughout the day.  There was always several visitors in the room and this writer was asked to return at another time.  Spoke with RN who did not have any immediate concerns.  CSW will continue efforts to meet with mother.

## 2013-03-03 ENCOUNTER — Ambulatory Visit (HOSPITAL_COMMUNITY): Payer: Medicaid Other

## 2013-03-03 LAB — CBC
Hemoglobin: 10.7 g/dL — ABNORMAL LOW (ref 12.0–15.0)
MCH: 28 pg (ref 26.0–34.0)
MCV: 89.8 fL (ref 78.0–100.0)
RBC: 3.82 MIL/uL — ABNORMAL LOW (ref 3.87–5.11)
WBC: 6.2 10*3/uL (ref 4.0–10.5)

## 2013-03-03 MED ORDER — OXYCODONE-ACETAMINOPHEN 5-325 MG PO TABS: 1.0000 | ORAL_TABLET | Freq: Four times a day (QID) | ORAL | Status: DC | PRN

## 2013-03-03 MED ORDER — RHO D IMMUNE GLOBULIN 1500 UNIT/2ML IJ SOLN
300.0000 ug | Freq: Once | INTRAMUSCULAR | Status: AC
  Administered 2013-03-03: 300 ug via INTRAMUSCULAR
  Filled 2013-03-03: qty 2

## 2013-03-03 NOTE — Progress Notes (Signed)
UR chart review completed.  

## 2013-03-03 NOTE — Discharge Summary (Signed)
Obstetric Discharge Summary Reason for Admission: induction of labor Prenatal Procedures: ultrasound Intrapartum Procedures: spontaneous vaginal delivery Postpartum Procedures: none Complications-Operative and Postpartum: none HGB  Date Value Range Status  02/26/2013 11.3* 11.6 - 15.9 g/dL Final     Hemoglobin  Date Value Range Status  03/03/2013 10.7* 12.0 - 15.0 g/dL Final     HCT  Date Value Range Status  03/03/2013 34.3* 36.0 - 46.0 % Final  02/26/2013 35.0  34.8 - 46.6 % Final    Physical Exam:  General: alert, cooperative and no distress Lochia: appropriate Uterine Fundus: firm Incision: healing well DVT Evaluation: No evidence of DVT seen on physical exam.  Discharge Diagnoses: Term Pregnancy-delivered  Discharge Information: Date: 03/03/2013 Activity: pelvic rest Diet: routine Medications: PNV and Percocet Condition: stable Instructions: refer to practice specific booklet Discharge to: home   Newborn Data: Live born female  Birth Weight: 7 lb 0.5 oz (3190 g) APGAR: 7, 9  Home with mother.  Anniebelle Devore II,Nyeemah Jennette E 03/03/2013, 9:53 PM

## 2013-03-03 NOTE — Progress Notes (Signed)
Patient was referred for history of depression/anxiety. * Referral screened out by Clinical Social Worker because none of the following criteria appear to apply: ~ History of anxiety/depression during this pregnancy, or of post-partum depression. ~ Diagnosis of anxiety and/or depression within last 3 years ~ History of depression due to pregnancy loss/loss of child OR * Patient's symptoms currently being treated with medication and/or therapy. Please contact the Clinical Social Worker if needs arise, or if patient requests.  PNR notes depression was related to IUFD and that patient received counseling. 

## 2013-03-03 NOTE — Progress Notes (Signed)
Patient having uterine cramps. Up to bathroom OK, voiding, passing flatus. VSS Afeb Ut firm , NT  D/W patient options Will D/C this pm No NSAIDS Percocet 5/325, #30, 1-2 po q6hrs prn NR

## 2013-03-03 NOTE — Progress Notes (Signed)
Post Partum Day 2 Subjective: up ad lib, voiding, tolerating PO, + flatus and complains of cramping, taking vicodin. Has not had baby circ secondary to [facility] insurance and non payment.    Objective: Blood pressure 106/69, pulse 85, temperature 98.3 F (36.8 C), temperature source Oral, resp. rate 18, height 5\' 3"  (1.6 m), weight 148 lb 3.2 oz (67.223 kg), last menstrual period 06/21/2012, SpO2 97.00%, unknown if currently breastfeeding.  Physical Exam:  General: alert, cooperative and no distress Lochia: appropriate Uterine Fundus: firm Incision: perineum intact no labial edema DVT Evaluation: No evidence of DVT seen on physical exam. Negative Homan's sign. No cords or calf tenderness. No significant calf/ankle edema.   Recent Labs  03/02/13 1549 03/03/13 0540  HGB 10.5* 10.7*  HCT 32.8* 34.3*    Assessment/Plan: ITP- has follow up appointment with Dr. Cyndie Chime in AM Change pain meds to percocet    LOS: 2 days   Nhyira Leano G 03/03/2013, 8:08 AM

## 2013-03-04 ENCOUNTER — Other Ambulatory Visit: Payer: Medicaid Other | Admitting: Lab

## 2013-03-04 ENCOUNTER — Ambulatory Visit: Payer: Medicaid Other | Admitting: Oncology

## 2013-03-04 ENCOUNTER — Encounter: Payer: Self-pay | Admitting: Oncology

## 2013-03-04 LAB — RH IG WORKUP (INCLUDES ABO/RH): Gestational Age(Wks): 36

## 2013-03-04 NOTE — Progress Notes (Signed)
33 year old woman with ITP who is currently pregnant. We have been monitoring her platelet counts throughout the pregnancy. Cancer been slowly drifting down. Done in our office on October 22 was 65,000. I recommended starting prednisone 20 mg daily at that time. She informed my nurse that she was not interested in the prednisone but would consider intravenous immunoglobulin. I was going to make arrangements to administer this product when we got a call from her gynecologist that she was admitted to the hospital in labor and delivered her baby one month early on 03/01/2013. Platelet count was 65,000 on day of delivery, fell to 54,000 the next day and came up to 59,000 x 10/ 27. Hemoglobin fell from 12 down to 10 g and was back up to 10.7 g by October 27. She had a scheduled appointment in our office today which she obviously missed.

## 2013-03-05 ENCOUNTER — Other Ambulatory Visit: Payer: Medicaid Other

## 2013-03-05 LAB — TYPE AND SCREEN: Unit division: 0

## 2013-03-06 ENCOUNTER — Other Ambulatory Visit: Payer: Self-pay | Admitting: Oncology

## 2013-03-12 ENCOUNTER — Other Ambulatory Visit (HOSPITAL_BASED_OUTPATIENT_CLINIC_OR_DEPARTMENT_OTHER): Payer: Medicaid Other | Admitting: Lab

## 2013-03-12 DIAGNOSIS — D693 Immune thrombocytopenic purpura: Secondary | ICD-10-CM

## 2013-03-12 DIAGNOSIS — O0993 Supervision of high risk pregnancy, unspecified, third trimester: Secondary | ICD-10-CM

## 2013-03-12 LAB — CBC WITH DIFFERENTIAL/PLATELET
BASO%: 0.5 % (ref 0.0–2.0)
EOS%: 0.8 % (ref 0.0–7.0)
HCT: 43.7 % (ref 34.8–46.6)
LYMPH%: 31.3 % (ref 14.0–49.7)
MCH: 28.8 pg (ref 25.1–34.0)
MCHC: 33 g/dL (ref 31.5–36.0)
MONO#: 0.5 10*3/uL (ref 0.1–0.9)
MONO%: 5.6 % (ref 0.0–14.0)
NEUT%: 61.8 % (ref 38.4–76.8)
RBC: 5 10*6/uL (ref 3.70–5.45)
lymph#: 2.5 10*3/uL (ref 0.9–3.3)

## 2013-03-13 ENCOUNTER — Other Ambulatory Visit: Payer: Self-pay

## 2013-03-13 ENCOUNTER — Telehealth: Payer: Self-pay | Admitting: *Deleted

## 2013-03-13 NOTE — Telephone Encounter (Signed)
Patient made aware that her platelet count is now normal. Per Dr. Cyndie Chime, just be sure someone checks her CBC in next few months to ensure counts are staying up. No need for follow up with hematology unless she gets pregnant again and has same problem. Patient notified. May follow up prn

## 2013-03-14 ENCOUNTER — Inpatient Hospital Stay (HOSPITAL_COMMUNITY)
Admission: AD | Admit: 2013-03-14 | Discharge: 2013-03-16 | DRG: 776 | Disposition: A | Discharge status: Home / Self Care | Payer: Medicaid Other | Source: Ambulatory Visit | Attending: Obstetrics and Gynecology | Admitting: Obstetrics and Gynecology

## 2013-03-14 ENCOUNTER — Encounter (HOSPITAL_COMMUNITY): Payer: Self-pay | Admitting: *Deleted

## 2013-03-14 DIAGNOSIS — R630 Anorexia: Secondary | ICD-10-CM

## 2013-03-14 DIAGNOSIS — O8612 Endometritis following delivery: Principal | ICD-10-CM

## 2013-03-14 DIAGNOSIS — R109 Unspecified abdominal pain: Secondary | ICD-10-CM | POA: Diagnosis present

## 2013-03-14 DIAGNOSIS — R161 Splenomegaly, not elsewhere classified: Secondary | ICD-10-CM

## 2013-03-14 DIAGNOSIS — O99893 Other specified diseases and conditions complicating puerperium: Secondary | ICD-10-CM

## 2013-03-14 LAB — URINALYSIS, ROUTINE W REFLEX MICROSCOPIC
Bilirubin Urine: NEGATIVE
Ketones, ur: 15 mg/dL — AB
Nitrite: NEGATIVE
Protein, ur: NEGATIVE mg/dL
Urobilinogen, UA: 0.2 mg/dL (ref 0.0–1.0)
pH: 5.5 (ref 5.0–8.0)

## 2013-03-14 LAB — CBC WITH DIFFERENTIAL/PLATELET
Basophils Relative: 0 % (ref 0–1)
Eosinophils Relative: 0 % (ref 0–5)
HCT: 41.4 % (ref 36.0–46.0)
Lymphocytes Relative: 15 % (ref 12–46)
Lymphs Abs: 1.8 10*3/uL (ref 0.7–4.0)
MCH: 28.1 pg (ref 26.0–34.0)
MCV: 86.3 fL (ref 78.0–100.0)
Monocytes Absolute: 0.6 10*3/uL (ref 0.1–1.0)
Neutro Abs: 9.3 10*3/uL — ABNORMAL HIGH (ref 1.7–7.7)
RBC: 4.8 MIL/uL (ref 3.87–5.11)
WBC: 11.7 10*3/uL — ABNORMAL HIGH (ref 4.0–10.5)

## 2013-03-14 LAB — URINE MICROSCOPIC-ADD ON

## 2013-03-14 MED ORDER — OXYCODONE-ACETAMINOPHEN 5-325 MG PO TABS
1.0000 | ORAL_TABLET | Freq: Once | ORAL | Status: AC
  Administered 2013-03-14: 1 via ORAL
  Filled 2013-03-14: qty 1

## 2013-03-14 MED ORDER — SODIUM CHLORIDE 0.9 % IV SOLN
3.0000 g | Freq: Four times a day (QID) | INTRAVENOUS | Status: DC
  Administered 2013-03-15 – 2013-03-16 (×6): 3 g via INTRAVENOUS
  Filled 2013-03-14 (×8): qty 3

## 2013-03-14 NOTE — MAU Provider Note (Signed)
History     CSN: [id number]  Arrival date and time: 03/14/13 2029   First Provider Initiated Contact with Patient 03/14/13 2124      Chief Complaint  Patient presents with  . Abdominal Cramping   HPI This is a 32 y.o. female who is 2 weeks post SVD who presents with a two day history of worsening lower abdominal pain.  States bleeding is scant. No breast pain (is breastfeeding). Has had a low grade fever. No dysuria.  RN Note: Pt delivered on 03-01-13 vaginally. Pt states abd cramping restarted yesterday but has gotten worse today. Pt state bleeding is minimal. Pt also describes intermit. Leg pain which alternates between legs.       OB History   Grav Para Term Preterm Abortions TAB SAB Ect Mult Living   4 4 1 3  0 0 0 0 0 3      Past Medical History  Diagnosis Date  . Thrombocytopenia     Idopathic with pregnancy  . No pertinent past medical history   . Pregnancy   . Depression   . Pregnancy, supervision of, high-risk 10/29/2012    Past Surgical History  Procedure Laterality Date  . No past surgeries      Family History  Problem Relation Age of Onset  . Diabetes Mother   . Diabetes Maternal Grandmother     History  Substance Use Topics  . Smoking status: Never Smoker   . Smokeless tobacco: Never Used  . Alcohol Use: No     Comment: 1-2 on weekend    Allergies:  Allergies  Allergen Reactions  . Zoloft [Sertraline Hcl] Hives  . Sulfa Antibiotics Hives    Prescriptions prior to admission  Medication Sig Dispense Refill  . acetaminophen (TYLENOL) 325 MG tablet Take 650 mg by mouth every 6 (six) hours as needed for pain.      . Cyanocobalamin (VITAMIN B 12 PO) Take 1 tablet by mouth daily.       . ferrous sulfate 325 (65 FE) MG tablet Take 325 mg by mouth daily with breakfast.      . oxyCODONE-acetaminophen (PERCOCET/ROXICET) 5-325 MG per tablet Take 1-2 tablets by mouth every 6 (six) hours as needed for pain.  30 tablet  0  . Prenatal Vit-Fe  Fumarate-FA (MULTIVITAMIN-PRENATAL) 27-0.8 MG TABS Take 1 tablet by mouth daily at 12 noon.        Review of Systems  Constitutional: Positive for fever and malaise/fatigue.  Respiratory: Negative for cough.   Gastrointestinal: Positive for abdominal pain. Negative for nausea and vomiting.  Genitourinary: Negative for dysuria.  Musculoskeletal: Negative for myalgias.  Neurological: Negative for headaches.   Physical Exam   Blood pressure 106/77, pulse 104, temperature 100.2 F (37.9 C), temperature source Oral, resp. rate 18, height 5\' 3"  (1.6 m), last menstrual period 06/21/2012, SpO2 99.00%, currently breastfeeding.  Physical Exam  Constitutional: She is oriented to person, place, and time. She appears well-developed and well-nourished. No distress.  HENT:  Head: Normocephalic.  Cardiovascular: Normal rate.   Respiratory: Effort normal.  Breasts soft and nontender   GI: Soft. She exhibits no distension. There is tenderness. There is rebound and guarding (mild).  Genitourinary: Vaginal discharge (scant pink) found.  Uterus tender, soft. Perineal sutures intact   Musculoskeletal: Normal range of motion.  Neurological: She is alert and oriented to person, place, and time.  Skin: Skin is warm and dry.  Psychiatric: She has a normal mood and affect.    MAU  Course  Procedures  MDM Discussed with Dr Henderson Cloud Results for orders placed during the hospital encounter of 03/14/13 (from the past 72 hour(s))  URINALYSIS, ROUTINE W REFLEX MICROSCOPIC     Status: Abnormal   Collection Time    03/14/13  8:35 PM      Result Value Range   Color, Urine YELLOW  YELLOW   APPearance CLEAR  CLEAR   Specific Gravity, Urine 1.020  1.005 - 1.030   pH 5.5  5.0 - 8.0   Glucose, UA NEGATIVE  NEGATIVE mg/dL   Hgb urine dipstick TRACE (*) NEGATIVE   Bilirubin Urine NEGATIVE  NEGATIVE   Ketones, ur 15 (*) NEGATIVE mg/dL   Protein, ur NEGATIVE  NEGATIVE mg/dL   Urobilinogen, UA 0.2  0.0 - 1.0  mg/dL   Nitrite NEGATIVE  NEGATIVE   Leukocytes, UA SMALL (*) NEGATIVE  URINE MICROSCOPIC-ADD ON     Status: None   Collection Time    03/14/13  8:35 PM      Result Value Range   Squamous Epithelial / LPF RARE  RARE   WBC, UA 3-6  <3 WBC/hpf   RBC / HPF 0-2  <3 RBC/hpf   Bacteria, UA RARE  RARE   Urine-Other MUCOUS PRESENT    CBC WITH DIFFERENTIAL     Status: Abnormal   Collection Time    03/14/13  9:55 PM      Result Value Range   WBC 11.7 (*) 4.0 - 10.5 K/uL   RBC 4.80  3.87 - 5.11 MIL/uL   Hemoglobin 13.5  12.0 - 15.0 g/dL   HCT 40.9  81.1 - 91.4 %   MCV 86.3  78.0 - 100.0 fL   MCH 28.1  26.0 - 34.0 pg   MCHC 32.6  30.0 - 36.0 g/dL   RDW 78.2 (*) 95.6 - 21.3 %   Platelets 142 (*) 150 - 400 K/uL   Neutrophils Relative % 79 (*) 43 - 77 %   Neutro Abs 9.3 (*) 1.7 - 7.7 K/uL   Lymphocytes Relative 15  12 - 46 %   Lymphs Abs 1.8  0.7 - 4.0 K/uL   Monocytes Relative 5  3 - 12 %   Monocytes Absolute 0.6  0.1 - 1.0 K/uL   Eosinophils Relative 0  0 - 5 %   Eosinophils Absolute 0.0  0.0 - 0.7 K/uL   Basophils Relative 0  0 - 1 %   Basophils Absolute 0.0  0.0 - 0.1 K/uL     Assessment and Plan  A:  Postpartum pain and fever       R/O endometritis vs appendicitis  P:  Will admit overnight for IV Unasyn       Abd/Pelvic CT ordered  [facility] 03/14/2013, 9:25 PM

## 2013-03-14 NOTE — MAU Note (Signed)
Pt delivered on 03-01-13 vaginally.  Pt states abd cramping restarted yesterday but has gotten worse today.  Pt state bleeding is minimal.  Pt also describes intermit. Leg pain which alternates between legs.

## 2013-03-15 ENCOUNTER — Inpatient Hospital Stay (HOSPITAL_COMMUNITY): Payer: Medicaid Other

## 2013-03-15 ENCOUNTER — Encounter (HOSPITAL_COMMUNITY): Payer: Self-pay | Admitting: *Deleted

## 2013-03-15 LAB — CBC
HCT: 37.8 % (ref 36.0–46.0)
MCHC: 32.8 g/dL (ref 30.0–36.0)
MCV: 86.7 fL (ref 78.0–100.0)
Platelets: 122 10*3/uL — ABNORMAL LOW (ref 150–400)
RDW: 15.7 % — ABNORMAL HIGH (ref 11.5–15.5)
WBC: 9 10*3/uL (ref 4.0–10.5)

## 2013-03-15 MED ORDER — ZOLPIDEM TARTRATE 5 MG PO TABS
5.0000 mg | ORAL_TABLET | Freq: Every evening | ORAL | Status: DC | PRN
  Administered 2013-03-16: 5 mg via ORAL
  Filled 2013-03-15: qty 1

## 2013-03-15 MED ORDER — HYDROMORPHONE HCL PF 1 MG/ML IJ SOLN
2.0000 mg | INTRAMUSCULAR | Status: DC | PRN
  Administered 2013-03-15: 2 mg via INTRAVENOUS
  Filled 2013-03-15: qty 2

## 2013-03-15 MED ORDER — ALUM & MAG HYDROXIDE-SIMETH 200-200-20 MG/5ML PO SUSP: 30.0000 mL | ORAL | Status: DC | PRN

## 2013-03-15 MED ORDER — IOHEXOL 300 MG/ML  SOLN
100.0000 mL | Freq: Once | INTRAMUSCULAR | Status: AC | PRN
  Administered 2013-03-15: 100 mL via INTRAVENOUS

## 2013-03-15 MED ORDER — IOHEXOL 300 MG/ML  SOLN
50.0000 mL | INTRAMUSCULAR | Status: AC
  Administered 2013-03-15: 50 mL via ORAL

## 2013-03-15 MED ORDER — IBUPROFEN 600 MG PO TABS
600.0000 mg | ORAL_TABLET | Freq: Four times a day (QID) | ORAL | Status: DC | PRN
  Administered 2013-03-15: 600 mg via ORAL
  Filled 2013-03-15: qty 1

## 2013-03-15 MED ORDER — LACTATED RINGERS IV SOLN
INTRAVENOUS | Status: DC
  Administered 2013-03-15: via INTRAVENOUS

## 2013-03-15 MED ORDER — ONDANSETRON HCL 4 MG PO TABS
4.0000 mg | ORAL_TABLET | Freq: Four times a day (QID) | ORAL | Status: DC | PRN
  Filled 2013-03-15: qty 1

## 2013-03-15 MED ORDER — ONDANSETRON HCL 4 MG/2ML IJ SOLN
4.0000 mg | Freq: Four times a day (QID) | INTRAMUSCULAR | Status: DC | PRN
  Administered 2013-03-15: 4 mg via INTRAVENOUS
  Filled 2013-03-15 (×2): qty 2

## 2013-03-15 MED ORDER — OXYCODONE-ACETAMINOPHEN 5-325 MG PO TABS
1.0000 | ORAL_TABLET | Freq: Four times a day (QID) | ORAL | Status: DC | PRN
  Administered 2013-03-15: 2 via ORAL
  Filled 2013-03-15: qty 2

## 2013-03-15 NOTE — Progress Notes (Signed)
Patient states hematology told her it is OK to take ibuprofen

## 2013-03-15 NOTE — H&P (Signed)
  32 yo G4P3 S/P SVD on 03/01/13. Discharged home doing well. Patient states began having low abdominal/pelvic cramping yesterday.  Poor appetite today. No N/V, no fever.  Normal BM yesterday and no pain with urination.  Filed Vitals:   03/14/13 2050  BP: 106/77  Pulse: 104  Temp: 100.2 F (37.9 C)  Resp: 18   Skin W&D Lungs CTA Cor RRR Abd soft, bilat lower quadrant tenderness with +/- rebound, BS +  Results for orders placed during the hospital encounter of 03/14/13 (from the past 24 hour(s))  URINALYSIS, ROUTINE W REFLEX MICROSCOPIC     Status: Abnormal   Collection Time    03/14/13  8:35 PM      Result Value Range   Color, Urine YELLOW  YELLOW   APPearance CLEAR  CLEAR   Specific Gravity, Urine 1.020  1.005 - 1.030   pH 5.5  5.0 - 8.0   Glucose, UA NEGATIVE  NEGATIVE mg/dL   Hgb urine dipstick TRACE (*) NEGATIVE   Bilirubin Urine NEGATIVE  NEGATIVE   Ketones, ur 15 (*) NEGATIVE mg/dL   Protein, ur NEGATIVE  NEGATIVE mg/dL   Urobilinogen, UA 0.2  0.0 - 1.0 mg/dL   Nitrite NEGATIVE  NEGATIVE   Leukocytes, UA SMALL (*) NEGATIVE  URINE MICROSCOPIC-ADD ON     Status: None   Collection Time    03/14/13  8:35 PM      Result Value Range   Squamous Epithelial / LPF RARE  RARE   WBC, UA 3-6  <3 WBC/hpf   RBC / HPF 0-2  <3 RBC/hpf   Bacteria, UA RARE  RARE   Urine-Other MUCOUS PRESENT    CBC WITH DIFFERENTIAL     Status: Abnormal   Collection Time    03/14/13  9:55 PM      Result Value Range   WBC 11.7 (*) 4.0 - 10.5 K/uL   RBC 4.80  3.87 - 5.11 MIL/uL   Hemoglobin 13.5  12.0 - 15.0 g/dL   HCT 45.4  09.8 - 11.9 %   MCV 86.3  78.0 - 100.0 fL   MCH 28.1  26.0 - 34.0 pg   MCHC 32.6  30.0 - 36.0 g/dL   RDW 14.7 (*) 82.9 - 56.2 %   Platelets 142 (*) 150 - 400 K/uL   Neutrophils Relative % 79 (*) 43 - 77 %   Neutro Abs 9.3 (*) 1.7 - 7.7 K/uL   Lymphocytes Relative 15  12 - 46 %   Lymphs Abs 1.8  0.7 - 4.0 K/uL   Monocytes Relative 5  3 - 12 %   Monocytes Absolute 0.6   0.1 - 1.0 K/uL   Eosinophils Relative 0  0 - 5 %   Eosinophils Absolute 0.0  0.0 - 0.7 K/uL   Basophils Relative 0  0 - 1 %   Basophils Absolute 0.0  0.0 - 0.1 K/uL   A: Probable PP endometritis, R/O appendicitis  P: IV fluids     Unasyn     NPO     Abd/pelvic CT     D/W patient

## 2013-03-15 NOTE — Plan of Care (Signed)
Problem: Phase I Progression Outcomes Goal: Pain controlled with appropriate interventions Outcome: Completed/Met Date Met:  03/15/13 Good pain control with IV Dilaudid Goal: OOB as tolerated unless otherwise ordered Outcome: Completed/Met Date Met:  03/15/13 Walks to bathroom without difficulty. Goal: Initial discharge plan identified Outcome: Completed/Met Date Met:  03/15/13 Afebrile.VSS Pain controlled WBC wnl

## 2013-03-15 NOTE — Progress Notes (Signed)
Feeling better, still using some pain meds. Nausea, tolerating liquids and some bits of regular diet. Ambulating.  BP 103/67  Pulse 68  Temp(Src) 98.2 F (36.8 C) (Oral)  Resp 16  Ht 5\' 4"  (1.626 m)  Wt 132 lb (59.875 kg)  BMI 22.65 kg/m2  SpO2 100%  LMP 06/21/2012  Breastfeeding? Yes  Abdomen soft, lower quadrant tenderness improved  Results for orders placed during the hospital encounter of 03/14/13 (from the past 24 hour(s))  URINALYSIS, ROUTINE W REFLEX MICROSCOPIC     Status: Abnormal   Collection Time    03/14/13  8:35 PM      Result Value Range   Color, Urine YELLOW  YELLOW   APPearance CLEAR  CLEAR   Specific Gravity, Urine 1.020  1.005 - 1.030   pH 5.5  5.0 - 8.0   Glucose, UA NEGATIVE  NEGATIVE mg/dL   Hgb urine dipstick TRACE (*) NEGATIVE   Bilirubin Urine NEGATIVE  NEGATIVE   Ketones, ur 15 (*) NEGATIVE mg/dL   Protein, ur NEGATIVE  NEGATIVE mg/dL   Urobilinogen, UA 0.2  0.0 - 1.0 mg/dL   Nitrite NEGATIVE  NEGATIVE   Leukocytes, UA SMALL (*) NEGATIVE  URINE MICROSCOPIC-ADD ON     Status: None   Collection Time    03/14/13  8:35 PM      Result Value Range   Squamous Epithelial / LPF RARE  RARE   WBC, UA 3-6  <3 WBC/hpf   RBC / HPF 0-2  <3 RBC/hpf   Bacteria, UA RARE  RARE   Urine-Other MUCOUS PRESENT    CBC WITH DIFFERENTIAL     Status: Abnormal   Collection Time    03/14/13  9:55 PM      Result Value Range   WBC 11.7 (*) 4.0 - 10.5 K/uL   RBC 4.80  3.87 - 5.11 MIL/uL   Hemoglobin 13.5  12.0 - 15.0 g/dL   HCT 16.1  09.6 - 04.5 %   MCV 86.3  78.0 - 100.0 fL   MCH 28.1  26.0 - 34.0 pg   MCHC 32.6  30.0 - 36.0 g/dL   RDW 40.9 (*) 81.1 - 91.4 %   Platelets 142 (*) 150 - 400 K/uL   Neutrophils Relative % 79 (*) 43 - 77 %   Neutro Abs 9.3 (*) 1.7 - 7.7 K/uL   Lymphocytes Relative 15  12 - 46 %   Lymphs Abs 1.8  0.7 - 4.0 K/uL   Monocytes Relative 5  3 - 12 %   Monocytes Absolute 0.6  0.1 - 1.0 K/uL   Eosinophils Relative 0  0 - 5 %   Eosinophils  Absolute 0.0  0.0 - 0.7 K/uL   Basophils Relative 0  0 - 1 %   Basophils Absolute 0.0  0.0 - 0.1 K/uL  CBC     Status: Abnormal   Collection Time    03/15/13  5:03 AM      Result Value Range   WBC 9.0  4.0 - 10.5 K/uL   RBC 4.36  3.87 - 5.11 MIL/uL   Hemoglobin 12.4  12.0 - 15.0 g/dL   HCT 78.2  95.6 - 21.3 %   MCV 86.7  78.0 - 100.0 fL   MCH 28.4  26.0 - 34.0 pg   MCHC 32.8  30.0 - 36.0 g/dL   RDW 08.6 (*) 57.8 - 46.9 %   Platelets 122 (*) 150 - 400 K/uL   CT-C/W probable endometritis, no  appendicitis, + splenomegaly  A: PP endometritis, still some nausea, not tolerating regular diet yet     Splenomegaly-Hx of ITP  P: Continue Unasyn, IV fluids      Will forward copy of CT to heme for FU of splenomegaly

## 2013-03-16 MED ORDER — AMOXICILLIN-POT CLAVULANATE 875-125 MG PO TABS: 1.0000 | ORAL_TABLET | Freq: Two times a day (BID) | ORAL | Status: DC

## 2013-03-16 NOTE — Progress Notes (Signed)
Discharge instruction provided to patient at bedside.  Activity, medications, follow up appointments, when to call the doctor and community resources discussed.  No questions at this time.  Patient left unit in stable condition with all personal belongings and prescription.  Osvaldo Angst, RN------------------

## 2013-03-16 NOTE — Plan of Care (Signed)
Problem: Discharge Progression Outcomes Goal: Pain controlled with appropriate interventions Outcome: Completed/Met Date Met:  03/16/13 Pain has decreased greatly per patient. Goal: Activity appropriate for discharge plan Outcome: Completed/Met Date Met:  03/16/13 Walks to bathroom without assistance and tolerates well

## 2013-03-16 NOTE — Progress Notes (Signed)
Feels much better Appetite back Pain much better  VSS Afeb Uterus NT  A: PP Endometritis  P: D/C home      Augmentin

## 2013-03-16 NOTE — Discharge Summary (Signed)
Physician Discharge Summary  Patient ID: Shelly Andrews MRN: [id number] DOB/AGE: [birth year] 32 y.o.  Admit date: 03/14/2013 Discharge date: 03/16/2013  Admission Diagnoses:PP Endometritis  Discharge Diagnoses: PP Endometritis Active Problems:   * No active hospital problems. *   Discharged Condition: good  Hospital Course: admitted to hospital for IV fluids and Unasyn. Had good resolution of fever, Had poor appetite second day. Now feels much better with good appetite.  Consults: None  Significant Diagnostic Studies: labs:  Results for orders placed during the hospital encounter of 03/14/13 (from the past 72 hour(s))  URINALYSIS, ROUTINE W REFLEX MICROSCOPIC     Status: Abnormal   Collection Time    03/14/13  8:35 PM      Result Value Range   Color, Urine YELLOW  YELLOW   APPearance CLEAR  CLEAR   Specific Gravity, Urine 1.020  1.005 - 1.030   pH 5.5  5.0 - 8.0   Glucose, UA NEGATIVE  NEGATIVE mg/dL   Hgb urine dipstick TRACE (*) NEGATIVE   Bilirubin Urine NEGATIVE  NEGATIVE   Ketones, ur 15 (*) NEGATIVE mg/dL   Protein, ur NEGATIVE  NEGATIVE mg/dL   Urobilinogen, UA 0.2  0.0 - 1.0 mg/dL   Nitrite NEGATIVE  NEGATIVE   Leukocytes, UA SMALL (*) NEGATIVE  URINE MICROSCOPIC-ADD ON     Status: None   Collection Time    03/14/13  8:35 PM      Result Value Range   Squamous Epithelial / LPF RARE  RARE   WBC, UA 3-6  <3 WBC/hpf   RBC / HPF 0-2  <3 RBC/hpf   Bacteria, UA RARE  RARE   Urine-Other MUCOUS PRESENT    CBC WITH DIFFERENTIAL     Status: Abnormal   Collection Time    03/14/13  9:55 PM      Result Value Range   WBC 11.7 (*) 4.0 - 10.5 K/uL   RBC 4.80  3.87 - 5.11 MIL/uL   Hemoglobin 13.5  12.0 - 15.0 g/dL   HCT 78.2  95.6 - 21.3 %   MCV 86.3  78.0 - 100.0 fL   MCH 28.1  26.0 - 34.0 pg   MCHC 32.6  30.0 - 36.0 g/dL   RDW 08.6 (*) 57.8 - 46.9 %   Platelets 142 (*) 150 - 400 K/uL   Neutrophils Relative % 79 (*) 43 - 77 %   Neutro Abs 9.3 (*) 1.7 - 7.7 K/uL   Lymphocytes Relative 15  12 - 46 %   Lymphs Abs 1.8  0.7 - 4.0 K/uL   Monocytes Relative 5  3 - 12 %   Monocytes Absolute 0.6  0.1 - 1.0 K/uL   Eosinophils Relative 0  0 - 5 %   Eosinophils Absolute 0.0  0.0 - 0.7 K/uL   Basophils Relative 0  0 - 1 %   Basophils Absolute 0.0  0.0 - 0.1 K/uL  CBC     Status: Abnormal   Collection Time    03/15/13  5:03 AM      Result Value Range   WBC 9.0  4.0 - 10.5 K/uL   RBC 4.36  3.87 - 5.11 MIL/uL   Hemoglobin 12.4  12.0 - 15.0 g/dL   HCT 62.9  52.8 - 41.3 %   MCV 86.7  78.0 - 100.0 fL   MCH 28.4  26.0 - 34.0 pg   MCHC 32.8  30.0 - 36.0 g/dL   RDW 24.4 (*) 01.0 - 27.2 %  Platelets 122 (*) 150 - 400 K/uL   Comment: REPEATED TO VERIFY     SPECIMEN CHECKED FOR CLOTS   and radiology: CT scan: abdomen / pelvis C/W probable endometritis  Treatments: IV hydration and antibiotics: Unasyn  Discharge Exam: Blood pressure 101/67, pulse 71, temperature 98.9 F (37.2 C), temperature source Oral, resp. rate 16, height 5\' 4"  (1.626 m), weight 132 lb (59.875 kg), last menstrual period 06/21/2012, SpO2 97.00%, currently breastfeeding. General appearance: alert, cooperative and no distress GI: soft, non-tender; bowel sounds normal; no masses,  no organomegaly  Disposition: 01-Home or Self Care   Future Appointments Provider Department Dept Phone   03/19/2013 1:45 PM [facility] [phone]       Medication List         acetaminophen 325 MG tablet  Commonly known as:  TYLENOL  Take 650 mg by mouth every 6 (six) hours as needed for pain.     amoxicillin-clavulanate 875-125 MG per tablet  Commonly known as:  AUGMENTIN  Take 1 tablet by mouth 2 (two) times daily.     ferrous sulfate 325 (65 FE) MG tablet  Take 325 mg by mouth daily with breakfast.     multivitamin-prenatal 27-0.8 MG Tabs tablet  Take 1 tablet by mouth daily at 12 noon.     oxyCODONE-acetaminophen 5-325 MG per tablet  Commonly  known as:  PERCOCET/ROXICET  Take 1-2 tablets by mouth every 6 (six) hours as needed for pain.     VITAMIN B 12 PO  Take 1 tablet by mouth daily.         Signed: Latonya Nelon II,Goerge Mohr E 03/16/2013, 10:08 AM

## 2013-03-19 ENCOUNTER — Other Ambulatory Visit: Payer: Medicaid Other

## 2013-03-23 ENCOUNTER — Other Ambulatory Visit: Payer: Self-pay | Admitting: Oncology

## 2013-03-23 DIAGNOSIS — D693 Immune thrombocytopenic purpura: Secondary | ICD-10-CM

## 2013-03-24 ENCOUNTER — Telehealth: Payer: Self-pay | Admitting: Oncology

## 2013-03-24 NOTE — Telephone Encounter (Signed)
lmonvm for pt re appt for 11/25. schedule mailed.

## 2013-04-01 ENCOUNTER — Other Ambulatory Visit: Payer: Medicaid Other

## 2013-04-01 ENCOUNTER — Ambulatory Visit: Payer: Medicaid Other | Admitting: Oncology

## 2013-04-02 ENCOUNTER — Encounter: Payer: Self-pay | Admitting: Oncology

## 2013-04-02 NOTE — Progress Notes (Signed)
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 I follow her through her recent pregnancy. I was just about to advise steroids and/or IV Ig when she went into labor and delivered at a platelet count of about 60,000 without any problems. Platelet count bounced back up. She came into our office on November 5 and platelet count was 165,000. Unfortunately 2 days later she developed endometritis and was readmitted to the hospital. Platelet count fell again with the infection down to 122,000 recorded on November 8. This is the last result in the chart.  As part of evaluation for the lower abdominal and pelvic pain a CT scan of the abdomen and pelvis was done on 03/15/2013 and showed mild splenomegaly with spleen size 15 cm. No other intra-abdominal pathology and specifically no lymphadenopathy.  We were contacted by her GYN and asked to reevaluate her. We scheduled the appointment for today but she failed to report. We will call and see if she still wants to be followed here. In retrospect, it may be that her chronic, mild, thrombocytopenia is actually related to the splenomegaly.

## 2013-04-04 ENCOUNTER — Telehealth: Payer: Self-pay | Admitting: Oncology

## 2013-04-04 ENCOUNTER — Other Ambulatory Visit: Payer: Self-pay | Admitting: Oncology

## 2013-04-04 DIAGNOSIS — D693 Immune thrombocytopenic purpura: Secondary | ICD-10-CM

## 2013-04-04 DIAGNOSIS — R161 Splenomegaly, not elsewhere classified: Secondary | ICD-10-CM

## 2013-04-04 NOTE — Telephone Encounter (Signed)
s.w. pt and advised on 12.10.14 appt....pt ok and awre

## 2013-04-16 ENCOUNTER — Other Ambulatory Visit (HOSPITAL_BASED_OUTPATIENT_CLINIC_OR_DEPARTMENT_OTHER): Payer: Medicaid Other

## 2013-04-16 ENCOUNTER — Telehealth: Payer: Self-pay | Admitting: Oncology

## 2013-04-16 ENCOUNTER — Ambulatory Visit (HOSPITAL_BASED_OUTPATIENT_CLINIC_OR_DEPARTMENT_OTHER): Payer: Medicaid Other | Admitting: Oncology

## 2013-04-16 ENCOUNTER — Encounter: Payer: Self-pay | Admitting: Oncology

## 2013-04-16 VITALS — BP 110/63 | HR 78 | Temp 98.2°F | Resp 18 | Ht 64.0 in | Wt 129.4 lb

## 2013-04-16 DIAGNOSIS — D693 Immune thrombocytopenic purpura: Secondary | ICD-10-CM

## 2013-04-16 DIAGNOSIS — R161 Splenomegaly, not elsewhere classified: Secondary | ICD-10-CM

## 2013-04-16 HISTORY — DX: Splenomegaly, not elsewhere classified: R16.1

## 2013-04-16 LAB — LACTATE DEHYDROGENASE (CC13): LDH: 139 U/L (ref 125–245)

## 2013-04-16 LAB — CBC WITH DIFFERENTIAL/PLATELET
Basophils Absolute: 0 10*3/uL (ref 0.0–0.1)
Eosinophils Absolute: 0.1 10*3/uL (ref 0.0–0.5)
HGB: 12.6 g/dL (ref 11.6–15.9)
LYMPH%: 39.5 % (ref 14.0–49.7)
MCHC: 32.4 g/dL (ref 31.5–36.0)
MCV: 89.1 fL (ref 79.5–101.0)
MONO%: 6.5 % (ref 0.0–14.0)
NEUT#: 2.6 10*3/uL (ref 1.5–6.5)
NEUT%: 52.3 % (ref 38.4–76.8)
Platelets: 124 10*3/uL — ABNORMAL LOW (ref 145–400)
RBC: 4.37 10*6/uL (ref 3.70–5.45)

## 2013-04-16 LAB — COMPREHENSIVE METABOLIC PANEL (CC13)
AST: 17 U/L (ref 5–34)
Albumin: 3.8 g/dL (ref 3.5–5.0)
Alkaline Phosphatase: 104 U/L (ref 40–150)
Anion Gap: 10 mEq/L (ref 3–11)
BUN: 10.1 mg/dL (ref 7.0–26.0)
Glucose: 95 mg/dl (ref 70–140)
Potassium: 4 mEq/L (ref 3.5–5.1)
Sodium: 142 mEq/L (ref 136–145)
Total Bilirubin: 0.54 mg/dL (ref 0.20–1.20)
Total Protein: 6.8 g/dL (ref 6.4–8.3)

## 2013-04-16 LAB — CHCC SMEAR

## 2013-04-16 LAB — MORPHOLOGY: PLT EST: DECREASED

## 2013-04-16 NOTE — Telephone Encounter (Signed)
appts made per 12/10 POF Pt adv [place] will call w appt AVS and JAN cal printed shh

## 2013-04-16 NOTE — Progress Notes (Signed)
Followup visit for this pleasant 32 year old woman who just delivered her second child. She is felt to have ITP which typically gets worse when she is pregnant with platelet counts dropping into the 50-60,000 range. She has had a bad experience with steroids in the past and didn't want to take any during this pregnancy. I was just about to prescribed IVIG when she delivered one month early on October 26. She told me she was already 6 cm dilated when she reached the hospital and she drove herself there. She was given a dose of RhoGAM at time of discharge on October 27. She came to our office for a CBC on November 5. Blood counts look great with a hemoglobin of 14.4 and a platelet count of 165,000 up from a nadir value of 54,000 on October 26. I thought she was clear but unfortunately a few days later she was readmitted to the hospital on November 8 with endometritis. Platelet count dipped but not less than 120,000. As part of her evaluation a CT scan of the abdomen and pelvis was done. Incidentally noted was borderline splenomegaly with spleen measuring 15 cm in length. Liver appeared normal. No other obvious pathology.  Borderline splenomegaly has been documented previously initially on a ultrasound done 01/19/2012 when spleen was measured as 13 cm. A followup study done 10/11/2012 showed spleen slightly smaller at 12.5 cm.  She has no history of liver disease. No history of mononucleosis, hepatitis, or other prolonged viral illness. There is no family history of any blood disorder in her parents were both still alive and well or her 106 year old brother.  Her platelet count has drifted down to 124,000. Hemoglobin 12.6. White count 4900. Today's differential with 52% neutrophils, 40% lymphocytes, 7 monocytes.  Exam: Lungs are clear. There is no cervical, supraclavicular, or axillary adenopathy Regular cardiac rhythm no murmur Abdomen soft, I was unable to palpate any splenomegaly either supine or in  the  left lateral decubitus position.  Impression: Borderline splenomegaly in an otherwise healthy young woman. Spleen is not palpable on my clinical exam but borderline increased on ultrasound and CT scan. This brings into question the diagnosis of ITP. However, it is likely, that the RhoGAM given during the recent hospitalization was responsible for the rise of her platelets and hemoglobin into the normal range suggesting an immune mediated process.  We discussed at length the differential diagnosis for splenomegaly in her particular situation. This may just be a variation of normal however the persistent slightly decreased platelet count could certainly be due to splenomegaly. She has no history of liver disease, normal liver functions on labs done today, and a normal appearing liver on CT scan. This would make passive congestion of the spleen unlikely. He inherited disorders of glycogen storage like get Gaucher's  disease would likely have already been diagnosed when she was a child. This also appears unlikely. I explained to her that the spleen derives from blood forming tissue like lymph glands which derive from lymphocytes which are born in the bone marrow. It is possible that splenomegaly could be secondary to lymph gland disorder like low-grade lymphoma. A bone marrow aspiration and biopsy may give [place] some more insight. We agreed to a period of observation and repeat ultrasound in January and will then make a decision on whether or not to proceed with a bone marrow biopsy. If we do the bone marrow, she would like to do this under parenteral sedation.

## 2013-05-27 ENCOUNTER — Other Ambulatory Visit: Payer: Medicaid Other

## 2013-05-27 ENCOUNTER — Other Ambulatory Visit: Payer: Self-pay | Admitting: *Deleted

## 2013-05-27 ENCOUNTER — Ambulatory Visit: Payer: Medicaid Other | Admitting: Oncology

## 2013-05-27 DIAGNOSIS — D693 Immune thrombocytopenic purpura: Secondary | ICD-10-CM

## 2013-05-27 DIAGNOSIS — R161 Splenomegaly, not elsewhere classified: Secondary | ICD-10-CM

## 2013-05-28 ENCOUNTER — Telehealth: Payer: Self-pay | Admitting: Oncology

## 2013-05-28 NOTE — Telephone Encounter (Signed)
[facility] will contact pt re US. no other orders.

## 2013-06-02 ENCOUNTER — Other Ambulatory Visit: Payer: Self-pay | Admitting: Oncology

## 2013-06-02 ENCOUNTER — Ambulatory Visit (HOSPITAL_COMMUNITY)
Admission: RE | Admit: 2013-06-02 | Discharge: 2013-06-02 | Disposition: A | Discharge status: Home / Self Care | Payer: Medicaid Other | Source: Ambulatory Visit | Attending: Oncology | Admitting: Oncology

## 2013-06-02 DIAGNOSIS — D693 Immune thrombocytopenic purpura: Secondary | ICD-10-CM

## 2013-06-02 DIAGNOSIS — R161 Splenomegaly, not elsewhere classified: Secondary | ICD-10-CM

## 2013-06-03 ENCOUNTER — Telehealth: Payer: Self-pay | Admitting: *Deleted

## 2013-06-03 NOTE — Telephone Encounter (Signed)
Left vm on cell # --normal spleen on ultrasound; call office if questions.

## 2013-06-03 NOTE — Telephone Encounter (Signed)
Message copied by Gala RomneyHORTON, Dafney Farler P on Tue Jun 03, 2013  1:24 PM ------      Message from: Levert FeinsteinGRANFORTUNA, JAMES M      Created: Mon Jun 02, 2013  7:19 PM       Call pt:  Normal spleen on ultrasound ------

## 2013-06-04 ENCOUNTER — Telehealth: Payer: Self-pay | Admitting: *Deleted

## 2013-06-04 NOTE — Telephone Encounter (Signed)
Lm informed the pt that G has death in the family. gv appt for 06/12/13 w/ labs@ 10:30am and ov@ 11am. Made pt aware that i will mail a letter/avs...td

## 2013-06-10 ENCOUNTER — Other Ambulatory Visit: Payer: Medicaid Other

## 2013-06-10 ENCOUNTER — Ambulatory Visit: Payer: Medicaid Other | Admitting: Oncology

## 2013-06-12 ENCOUNTER — Ambulatory Visit (HOSPITAL_BASED_OUTPATIENT_CLINIC_OR_DEPARTMENT_OTHER): Payer: Medicaid Other | Admitting: Oncology

## 2013-06-12 ENCOUNTER — Ambulatory Visit (HOSPITAL_BASED_OUTPATIENT_CLINIC_OR_DEPARTMENT_OTHER): Payer: Medicaid Other

## 2013-06-12 VITALS — BP 109/62 | HR 90 | Temp 98.8°F | Resp 18 | Ht 64.0 in | Wt 127.5 lb

## 2013-06-12 DIAGNOSIS — D696 Thrombocytopenia, unspecified: Secondary | ICD-10-CM

## 2013-06-12 DIAGNOSIS — R161 Splenomegaly, not elsewhere classified: Secondary | ICD-10-CM

## 2013-06-12 DIAGNOSIS — D693 Immune thrombocytopenic purpura: Secondary | ICD-10-CM

## 2013-06-12 LAB — CBC & DIFF AND RETIC
BASO%: 0.2 % (ref 0.0–2.0)
Basophils Absolute: 0 10*3/uL (ref 0.0–0.1)
EOS%: 1.1 % (ref 0.0–7.0)
Eosinophils Absolute: 0.1 10*3/uL (ref 0.0–0.5)
HEMATOCRIT: 38.2 % (ref 34.8–46.6)
HGB: 12.8 g/dL (ref 11.6–15.9)
IMMATURE RETIC FRACT: 7.5 % (ref 1.60–10.00)
LYMPH#: 1.5 10*3/uL (ref 0.9–3.3)
LYMPH%: 31.5 % (ref 14.0–49.7)
MCH: 30 pg (ref 25.1–34.0)
MCHC: 33.5 g/dL (ref 31.5–36.0)
MCV: 89.5 fL (ref 79.5–101.0)
MONO#: 0.4 10*3/uL (ref 0.1–0.9)
MONO%: 7.4 % (ref 0.0–14.0)
NEUT#: 2.9 10*3/uL (ref 1.5–6.5)
NEUT%: 59.8 % (ref 38.4–76.8)
Platelets: 169 10*3/uL (ref 145–400)
RBC: 4.27 10*6/uL (ref 3.70–5.45)
RDW: 13.7 % (ref 11.2–14.5)
RETIC CT ABS: 61.92 10*3/uL (ref 33.70–90.70)
Retic %: 1.45 % (ref 0.70–2.10)
WBC: 4.8 10*3/uL (ref 3.9–10.3)
nRBC: 0 % (ref 0–0)

## 2013-06-12 LAB — MORPHOLOGY: PLT EST: ADEQUATE

## 2013-06-12 LAB — CHCC SMEAR

## 2013-06-12 NOTE — Progress Notes (Signed)
Short interval followup visit for this pleasant 33 year old woman with chronic ITP that only seems to manifest itself during pregnancy. She is Rh-. She has been pregnant 4 times. Her third pregnancy ended in a stillbirth at 31 weeks. Most recent pregnancy resulted in a full-term female child on 03/01/2013. Platelet count on day of delivery 65,000, fell to a nadir of 54,000 the next day, then recovered to 165,000 by November 5. She developed endometritis and was readmitted to the hospital on November 7. Platelet count fell to 122,000. CT scan done on November 8 to evaluate her symptoms reported splenomegaly with maximum spleen dimension 15 cm. When I saw her back for a followup visit on December 10, I was unable to palpate an enlarged spleen on my exam but platelet count did not rebound into the normal range and was 124,000. She was clinically stable. I felt a period of observation was appropriate before considering a more aggressive evaluation such as doing a bone marrow biopsy.  Platelet count today has normalized at 169,000. Followup ultrasound of the abdomen done 06/02/2013 now shows the spleen is normal size maximum dimension 11.9 cm.  Impression: #1. ITP exacerbated by pregnancy and infection Platelet count now normalized  #2. Borderline splenomegaly on some scans with no clinically palpable spleen, no lymphadenopathy, negative hepatitis A, B., C., HIV. Current ultrasound with normal size spleen.  #3. Rh- status treated with postpartum RhoGAM  No further evaluation is indicated at this time. I will see her again in 6 months just to check to see if a CBC is stable.

## 2013-06-14 LAB — CMV IGM: CMV IgM: <8 AU/mL (ref <30.00)

## 2013-06-14 LAB — HEPATITIS PANEL, ACUTE
HCV AB: NEGATIVE
HEP A IGM: NONREACTIVE
HEP B S AG: NEGATIVE
Hep B C IgM: NONREACTIVE

## 2013-06-14 LAB — CYTOMEGALOVIRUS ANTIBODY, IGG: Cytomegalovirus Ab-IgG: 0.59 U/mL (ref <0.60)

## 2013-06-14 LAB — EPSTEIN-BARR VIRUS VCA, IGM

## 2013-06-14 LAB — EPSTEIN-BARR VIRUS VCA, IGG: EBV VCA IGG: 22 U/mL — AB (ref <18.0)

## 2013-06-17 ENCOUNTER — Telehealth: Payer: Self-pay | Admitting: *Deleted

## 2013-06-17 NOTE — Telephone Encounter (Signed)
Notified pt of negative lab results per Dr.Granfortuna's request.

## 2013-06-17 NOTE — Telephone Encounter (Signed)
Message copied by Sabino SnipesHARDIN, Malene Blaydes on Tue Jun 17, 2013 11:00 AM ------      Message from: Levert FeinsteinGRANFORTUNA, JAMES M      Created: Sat Jun 14, 2013 11:37 AM       Call pt: Hepatitis A,B,C, mono and CMV virus panels negative ------

## 2013-07-06 ENCOUNTER — Encounter: Payer: Self-pay | Admitting: Oncology

## 2013-12-23 ENCOUNTER — Emergency Department (HOSPITAL_COMMUNITY)
Admission: EM | Admit: 2013-12-23 | Discharge: 2013-12-23 | Disposition: A | Discharge status: Home / Self Care | Payer: Medicaid Other | Source: Home / Self Care | Attending: Family Medicine | Admitting: Family Medicine

## 2013-12-23 ENCOUNTER — Encounter (HOSPITAL_COMMUNITY): Payer: Self-pay | Admitting: Emergency Medicine

## 2013-12-23 DIAGNOSIS — J069 Acute upper respiratory infection, unspecified: Secondary | ICD-10-CM

## 2013-12-23 DIAGNOSIS — B9789 Other viral agents as the cause of diseases classified elsewhere: Principal | ICD-10-CM

## 2013-12-23 LAB — POCT RAPID STREP A: STREPTOCOCCUS, GROUP A SCREEN (DIRECT): NEGATIVE

## 2013-12-23 MED ORDER — FLUTICASONE PROPIONATE 50 MCG/ACT NA SUSP: 1.0000 | Freq: Every day | NASAL | Status: DC

## 2013-12-23 MED ORDER — GUAIFENESIN-CODEINE 100-10 MG/5ML PO SOLN: 5.0000 mL | Freq: Three times a day (TID) | ORAL | Status: DC | PRN

## 2013-12-23 MED ORDER — ANTIPYRINE-BENZOCAINE 5.4-1.4 % OT SOLN: 3.0000 [drp] | OTIC | Status: DC | PRN

## 2013-12-23 MED ORDER — IPRATROPIUM BROMIDE 0.06 % NA SOLN: 2.0000 | Freq: Four times a day (QID) | NASAL | Status: DC

## 2013-12-23 NOTE — ED Notes (Signed)
C/o cold sx States she has a cough, ear ache and sore throat for a few days now otc meds was used as tx

## 2013-12-23 NOTE — ED Provider Notes (Signed)
CSN: [id number]     Arrival date & time 12/23/13  1821 History   First MD Initiated Contact with Patient 12/23/13 1954     Chief Complaint  Patient presents with  . URI   (Consider location/radiation/quality/duration/timing/severity/associated sxs/prior Treatment) HPI  Cough: started 4 days ago. Sick children at home. Sore throat. Ear pain. Denies CP, SOB, fevers. Pain from coughing. Minimal nasal discharge. Worse at night and in the morning.   No allergies.   Past Medical History  Diagnosis Date  . Thrombocytopenia     Idopathic with pregnancy  . No pertinent past medical history   . Pregnancy   . Depression   . Pregnancy, supervision of, high-risk 10/29/2012  . Splenomegaly 04/16/2013   Past Surgical History  Procedure Laterality Date  . No past surgeries     Family History  Problem Relation Age of Onset  . Diabetes Mother   . Diabetes Maternal Grandmother    History  Substance Use Topics  . Smoking status: Never Smoker   . Smokeless tobacco: Never Used  . Alcohol Use: No     Comment: 1-2 on weekend   OB History   Grav Para Term Preterm Abortions TAB SAB Ect Mult Living   4 4 1 3  0 0 0 0 0 3     Review of Systems Per HPI with all other pertinent systems negative.   Allergies  Zoloft and Sulfa antibiotics  Home Medications   Prior to Admission medications   Medication Sig Start Date End Date Taking? Authorizing Provider  acetaminophen (TYLENOL) 325 MG tablet Take 650 mg by mouth every 6 (six) hours as needed for pain.    Historical Provider, MD  antipyrine-benzocaine Lyla Son(AURALGAN) otic solution Place 3-4 drops into the right ear every 2 (two) hours as needed for ear pain. 12/23/13   Ozella Rocksavid J Merrell, MD  fluticasone (FLONASE) 50 MCG/ACT nasal spray Place 1-2 sprays into both nostrils daily. 12/23/13   Ozella Rocksavid J Merrell, MD  guaiFENesin-codeine 100-10 MG/5ML syrup Take 5-10 mLs by mouth 3 (three) times daily as needed for cough. 12/23/13   Ozella Rocksavid J Merrell, MD   ipratropium (ATROVENT) 0.06 % nasal spray Place 2 sprays into both nostrils 4 (four) times daily. 12/23/13   Ozella Rocksavid J Merrell, MD   BP 115/73  Pulse 78  Temp(Src) 98.6 F (37 C) (Oral)  Resp 14  SpO2 100%  LMP 12/12/2013 Physical Exam  Constitutional: She is oriented to person, place, and time. She appears well-developed and well-nourished. No distress.  HENT:  Nasal speech, nasal turbinates bggy bilat TM nml bilat R external canal injected  Eyes: EOM are normal. Pupils are equal, round, and reactive to light.  Neck: Normal range of motion. Neck supple.  Pulmonary/Chest: Effort normal. No respiratory distress.  Abdominal: She exhibits no distension.  Musculoskeletal: Normal range of motion. She exhibits no edema.  Neurological: She is alert and oriented to person, place, and time. No cranial nerve deficit.  Skin: Skin is warm and dry. She is not diaphoretic.  Psychiatric: She has a normal mood and affect. Her behavior is normal. Judgment and thought content normal.    ED Course  Procedures (including critical care time) Labs Review Labs Reviewed  POCT RAPID STREP A ([facility] CARE ONLY)    Imaging Review No results found.   MDM   1. Viral URI with cough   otitis externa  Viral etiology most likely  Post nasal drip causing cougha dn irritation - start robitussin Ac -  flonase - nasal atrovent - call back if not improving in another 4-5 days or if develops sinus pain;pressure and fevers  Auralgan for non-infectious otitis externa on R.   Precautions given and all questions answered   Shelly Flatten, MD Family Medicine 12/23/2013, 8:08 PM      Ozella Rocks, MD 12/23/13 2008

## 2013-12-23 NOTE — Discharge Instructions (Signed)
You have a viral illness. This will take another 1-5 days to clear If you develop fevers or are not better after this time please call back as you will likely need antibiotics Your cough i s primarily from post nasal drip Please start the flonase at night  Please use the atrovent during the day Please use the robitussin for cough Please use the auralgan for your ear irritation

## 2013-12-25 LAB — CULTURE, GROUP A STREP

## 2013-12-25 NOTE — ED Notes (Signed)
Lab review

## 2014-02-22 ENCOUNTER — Encounter (HOSPITAL_COMMUNITY): Payer: Self-pay | Admitting: Emergency Medicine

## 2014-02-22 ENCOUNTER — Emergency Department (HOSPITAL_COMMUNITY)
Admission: EM | Admit: 2014-02-22 | Discharge: 2014-02-22 | Disposition: A | Discharge status: Home / Self Care | Payer: Medicaid Other | Source: Home / Self Care | Attending: Family Medicine | Admitting: Family Medicine

## 2014-02-22 ENCOUNTER — Other Ambulatory Visit (HOSPITAL_COMMUNITY)
Admission: RE | Admit: 2014-02-22 | Discharge: 2014-02-22 | Disposition: A | Discharge status: Home / Self Care | Payer: Medicaid Other | Source: Ambulatory Visit | Attending: Family Medicine | Admitting: Family Medicine

## 2014-02-22 DIAGNOSIS — N898 Other specified noninflammatory disorders of vagina: Secondary | ICD-10-CM

## 2014-02-22 DIAGNOSIS — N760 Acute vaginitis: Secondary | ICD-10-CM | POA: Diagnosis present

## 2014-02-22 DIAGNOSIS — N920 Excessive and frequent menstruation with regular cycle: Secondary | ICD-10-CM

## 2014-02-22 DIAGNOSIS — Z113 Encounter for screening for infections with a predominantly sexual mode of transmission: Secondary | ICD-10-CM | POA: Insufficient documentation

## 2014-02-22 DIAGNOSIS — Z202 Contact with and (suspected) exposure to infections with a predominantly sexual mode of transmission: Secondary | ICD-10-CM

## 2014-02-22 LAB — POCT PREGNANCY, URINE: PREG TEST UR: NEGATIVE

## 2014-02-22 MED ORDER — AZITHROMYCIN 250 MG PO TABS: 1000.0000 mg | ORAL_TABLET | Freq: Once | ORAL | Status: DC

## 2014-02-22 MED ORDER — AZITHROMYCIN 250 MG PO TABS
ORAL_TABLET | ORAL | Status: AC
  Filled 2014-02-22: qty 4

## 2014-02-22 MED ORDER — AZITHROMYCIN 250 MG PO TABS
1000.0000 mg | ORAL_TABLET | Freq: Every day | ORAL | Status: DC
  Administered 2014-02-22: 1000 mg via ORAL

## 2014-02-22 MED ORDER — CEFTRIAXONE SODIUM 250 MG IJ SOLR
250.0000 mg | Freq: Once | INTRAMUSCULAR | Status: AC
  Administered 2014-02-22: 250 mg via INTRAMUSCULAR

## 2014-02-22 MED ORDER — CEFTRIAXONE SODIUM 250 MG IJ SOLR
INTRAMUSCULAR | Status: AC
  Filled 2014-02-22: qty 250

## 2014-02-22 MED ORDER — LIDOCAINE HCL (PF) 1 % IJ SOLN
INTRAMUSCULAR | Status: AC
  Filled 2014-02-22: qty 5

## 2014-02-22 NOTE — ED Notes (Signed)
No S/S allergic reaction. 

## 2014-02-22 NOTE — ED Provider Notes (Signed)
CSN: [id number]     Arrival date & time 02/22/14  1653 History   First MD Initiated Contact with Patient 02/22/14 1725     No chief complaint on file.  (Consider location/radiation/quality/duration/timing/severity/associated sxs/prior Treatment) HPI     33 year old female presents complaining of possible STD. She notes that she has exposure to Chlamydia. She has spotting for the past few days. She denies discharge, abdominal pain, pelvic pain. Denies any genital lesions.  Past Medical History  Diagnosis Date  . Thrombocytopenia     Idopathic with pregnancy  . No pertinent past medical history   . Pregnancy   . Depression   . Pregnancy, supervision of, high-risk 10/29/2012  . Splenomegaly 04/16/2013   Past Surgical History  Procedure Laterality Date  . No past surgeries     Family History  Problem Relation Age of Onset  . Diabetes Mother   . Diabetes Maternal Grandmother    History  Substance Use Topics  . Smoking status: Never Smoker   . Smokeless tobacco: Never Used  . Alcohol Use: No     Comment: 1-2 on weekend   OB History   Grav Para Term Preterm Abortions TAB SAB Ect Mult Living   4 4 1 3  0 0 0 0 0 3     Review of Systems  Genitourinary: Positive for vaginal bleeding. Negative for dysuria, urgency, frequency, hematuria, vaginal discharge, genital sores, vaginal pain and pelvic pain.  All other systems reviewed and are negative.   Allergies  Zoloft and Sulfa antibiotics  Home Medications   Prior to Admission medications   Medication Sig Start Date End Date Taking? Authorizing Provider  acetaminophen (TYLENOL) 325 MG tablet Take 650 mg by mouth every 6 (six) hours as needed for pain.    Historical Provider, MD  antipyrine-benzocaine Lyla Son(AURALGAN) otic solution Place 3-4 drops into the right ear every 2 (two) hours as needed for ear pain. 12/23/13   Ozella Rocksavid J Merrell, MD  fluticasone (FLONASE) 50 MCG/ACT nasal spray Place 1-2 sprays into both nostrils daily. 12/23/13    Ozella Rocksavid J Merrell, MD  guaiFENesin-codeine 100-10 MG/5ML syrup Take 5-10 mLs by mouth 3 (three) times daily as needed for cough. 12/23/13   Ozella Rocksavid J Merrell, MD  ipratropium (ATROVENT) 0.06 % nasal spray Place 2 sprays into both nostrils 4 (four) times daily. 12/23/13   Ozella Rocksavid J Merrell, MD   BP 113/78  Pulse 82  Temp(Src) 98.4 F (36.9 C) (Oral)  Resp 16  SpO2 99% Physical Exam  Nursing note and vitals reviewed. Constitutional: She is oriented to person, place, and time. Vital signs are normal. She appears well-developed and well-nourished. No distress.  HENT:  Head: Normocephalic and atraumatic.  Pulmonary/Chest: Effort normal. No respiratory distress.  Abdominal: Soft. She exhibits no distension and no mass. There is no tenderness. There is no rebound and no guarding.  Genitourinary: Cervix exhibits no motion tenderness, no discharge and no friability. Right adnexum displays no mass, no tenderness and no fullness. Left adnexum displays no mass, no tenderness and no fullness. No erythema or bleeding around the vagina. Vaginal discharge (light Brown) found.  Lymphadenopathy:       Right: No inguinal adenopathy present.       Left: No inguinal adenopathy present.  Neurological: She is alert and oriented to person, place, and time. She has normal strength. Coordination normal.  Skin: Skin is warm and dry. No rash noted. She is not diaphoretic.  Psychiatric: She has a normal mood and affect. Judgment  normal.    ED Course  Procedures (including critical care time) Labs Review Labs Reviewed  RPR  HIV ANTIBODY (ROUTINE TESTING)  POCT PREGNANCY, URINE  CERVICOVAGINAL ANCILLARY ONLY    Imaging Review No results found.   MDM   1. Vaginal discharge   2. Spotting   3. STD exposure    profuse discharge with STD exposure, we'll treat for GC and Chlamydia. Cervicovaginal cytology, HIV, RPR sent. Followup when necessary  Meds ordered this encounter  Medications  . cefTRIAXone (ROCEPHIN)  injection 250 mg    Sig:   . azithromycin (ZITHROMAX) tablet 1,000 mg    Sig:      Graylon GoodZachary H Senaya Dicenso, PA-C 02/22/14 1819

## 2014-02-22 NOTE — ED Notes (Signed)
Reports being told by boyfriend's sexual partner that she has been exposed to chlamydia.  C/O vaginal spotting - unusual for her.  Denies any pain.

## 2014-02-22 NOTE — Discharge Instructions (Signed)

## 2014-02-23 LAB — CERVICOVAGINAL ANCILLARY ONLY
Chlamydia: NEGATIVE
Neisseria Gonorrhea: NEGATIVE
WET PREP (BD AFFIRM): NEGATIVE
WET PREP (BD AFFIRM): POSITIVE — AB
Wet Prep (BD Affirm): NEGATIVE

## 2014-02-23 LAB — HIV ANTIBODY (ROUTINE TESTING W REFLEX): HIV: NONREACTIVE

## 2014-02-23 LAB — RPR

## 2014-02-23 NOTE — ED Provider Notes (Signed)
Medical screening examination/treatment/procedure(s) were performed by a resident physician or non-physician practitioner and as the supervising physician I was immediately available for consultation/collaboration.  Valinda Fedie, MD    Agapito Hanway S Chantille Navarrete, MD 02/23/14 0751 

## 2014-02-28 ENCOUNTER — Telehealth (HOSPITAL_COMMUNITY): Payer: Self-pay | Admitting: *Deleted

## 2014-02-28 NOTE — ED Notes (Addendum)
GC/Chlamydia neg., Affirm: Candida and Trich neg., Gardnerella pos.  10/20 Message sent to [employer] Group[employer] Baker PA.  He ordered Flagyl 500 mg. BID #14, if pt. is symptomatic.  I called pt. and left a message to call.  Call 1. 02/28/2014 Left message.  Call 2. 03/01/2014 I called pt. Pt. verified x 2 and given results.  Pt. Said she has not symptoms. I told her no symptoms then no further treatment needed.  Call 3. Shelly Andrews, Shelly Andrews 03/03/2014

## 2014-03-09 ENCOUNTER — Encounter (HOSPITAL_COMMUNITY): Payer: Self-pay | Admitting: Emergency Medicine

## 2014-09-22 ENCOUNTER — Other Ambulatory Visit: Payer: Self-pay | Admitting: Obstetrics & Gynecology

## 2014-09-23 LAB — CYTOLOGY - PAP

## 2014-10-15 ENCOUNTER — Encounter (HOSPITAL_COMMUNITY): Payer: Self-pay | Admitting: Emergency Medicine

## 2014-10-15 ENCOUNTER — Other Ambulatory Visit (HOSPITAL_COMMUNITY)
Admission: RE | Admit: 2014-10-15 | Discharge: 2014-10-15 | Disposition: A | Discharge status: Home / Self Care | Payer: Medicaid Other | Source: Ambulatory Visit | Attending: Family Medicine | Admitting: Family Medicine

## 2014-10-15 ENCOUNTER — Emergency Department (HOSPITAL_COMMUNITY)
Admission: EM | Admit: 2014-10-15 | Discharge: 2014-10-15 | Disposition: A | Discharge status: Home / Self Care | Payer: Medicaid Other | Source: Home / Self Care | Attending: Family Medicine | Admitting: Family Medicine

## 2014-10-15 DIAGNOSIS — Z113 Encounter for screening for infections with a predominantly sexual mode of transmission: Secondary | ICD-10-CM | POA: Insufficient documentation

## 2014-10-15 DIAGNOSIS — Z202 Contact with and (suspected) exposure to infections with a predominantly sexual mode of transmission: Secondary | ICD-10-CM

## 2014-10-15 LAB — POCT PREGNANCY, URINE: Preg Test, Ur: NEGATIVE

## 2014-10-15 LAB — POCT URINALYSIS DIP (DEVICE)
Bilirubin Urine: NEGATIVE
Glucose, UA: NEGATIVE mg/dL
HGB URINE DIPSTICK: NEGATIVE
Ketones, ur: NEGATIVE mg/dL
LEUKOCYTES UA: NEGATIVE
Nitrite: NEGATIVE
Protein, ur: NEGATIVE mg/dL
SPECIFIC GRAVITY, URINE: 1.025 (ref 1.005–1.030)
Urobilinogen, UA: 0.2 mg/dL (ref 0.0–1.0)
pH: 6 (ref 5.0–8.0)

## 2014-10-15 MED ORDER — CEFTRIAXONE SODIUM 250 MG IJ SOLR
250.0000 mg | Freq: Once | INTRAMUSCULAR | Status: AC
  Administered 2014-10-15: 250 mg via INTRAMUSCULAR

## 2014-10-15 MED ORDER — AZITHROMYCIN 250 MG PO TABS
ORAL_TABLET | ORAL | Status: AC
  Filled 2014-10-15: qty 4

## 2014-10-15 MED ORDER — CEFTRIAXONE SODIUM 250 MG IJ SOLR
INTRAMUSCULAR | Status: AC
Start: 2014-10-15 — End: 2014-10-15
  Filled 2014-10-15: qty 250

## 2014-10-15 MED ORDER — LIDOCAINE HCL (PF) 1 % IJ SOLN
INTRAMUSCULAR | Status: AC
  Filled 2014-10-15: qty 5

## 2014-10-15 MED ORDER — AZITHROMYCIN 250 MG PO TABS
1000.0000 mg | ORAL_TABLET | Freq: Once | ORAL | Status: AC
Start: 2014-10-15 — End: 2014-10-15
  Administered 2014-10-15: 1000 mg via ORAL

## 2014-10-15 NOTE — ED Notes (Signed)
Call back number verified.  

## 2014-10-15 NOTE — ED Provider Notes (Addendum)
Shelly Andrews is a 34 y.o. female who presents to Urgent Care today for STD test. Patient's partner recently was tested for and treated for some unknown STD. She is here today for evaluation of possible sexually transmitted infection. She is asymptomatic. No vaginal discharge urinary symptoms fevers or chills. No abdominal pain. No treatment tried yet.   Past Medical History  Diagnosis Date  . Thrombocytopenia     Idopathic with pregnancy  . No pertinent past medical history   . Pregnancy   . Depression   . Pregnancy, supervision of, high-risk 10/29/2012  . Splenomegaly 04/16/2013   Past Surgical History  Procedure Laterality Date  . No past surgeries     History  Substance Use Topics  . Smoking status: Never Smoker   . Smokeless tobacco: Never Used  . Alcohol Use: No     Comment: 1-2 on weekend   ROS as above Medications: No current facility-administered medications for this encounter.   Current Outpatient Prescriptions  Medication Sig Dispense Refill  . acetaminophen (TYLENOL) 325 MG tablet Take 650 mg by mouth every 6 (six) hours as needed for pain.    Marland Kitchen antipyrine-benzocaine (AURALGAN) otic solution Place 3-4 drops into the right ear every 2 (two) hours as needed for ear pain. 10 mL 0  . fluticasone (FLONASE) 50 MCG/ACT nasal spray Place 1-2 sprays into both nostrils daily. 16 g 2  . guaiFENesin-codeine 100-10 MG/5ML syrup Take 5-10 mLs by mouth 3 (three) times daily as needed for cough. 120 mL 0  . ipratropium (ATROVENT) 0.06 % nasal spray Place 2 sprays into both nostrils 4 (four) times daily. 15 mL 12   Allergies  Allergen Reactions  . Zoloft [Sertraline Hcl] Hives  . Sulfa Antibiotics Hives     Exam:  BP 118/77 mmHg  Pulse 79  Temp(Src) 98.3 F (36.8 C) (Oral)  Resp 16  SpO2 98%  LMP 10/08/2014 Gen: Well NAD HEENT: EOMI,  MMM Exts: Brisk capillary refill, warm and well perfused.   Patient was given 1 g by mouth azithromycin and 250 mg IM  ceftriaxone  Results for orders placed or performed during the hospital encounter of 10/15/14 (from the past 24 hour(s))  POCT urinalysis dip (device)     Status: None   Collection Time: 10/15/14  8:07 PM  Result Value Ref Range   Glucose, UA NEGATIVE NEGATIVE mg/dL   Bilirubin Urine NEGATIVE NEGATIVE   Ketones, ur NEGATIVE NEGATIVE mg/dL   Specific Gravity, Urine 1.025 1.005 - 1.030   Hgb urine dipstick NEGATIVE NEGATIVE   pH 6.0 5.0 - 8.0   Protein, ur NEGATIVE NEGATIVE mg/dL   Urobilinogen, UA 0.2 0.0 - 1.0 mg/dL   Nitrite NEGATIVE NEGATIVE   Leukocytes, UA NEGATIVE NEGATIVE  Pregnancy, urine POC     Status: None   Collection Time: 10/15/14  8:21 PM  Result Value Ref Range   Preg Test, Ur NEGATIVE NEGATIVE   No results found.  Assessment and Plan: 34 y.o. female with STD exposure. Urine cytology pending for gonorrhea Chlamydia Trichomonas. HIV and syphilis tests are pending. Empiric treatment as above. We'll call with results of positive.  Discussed warning signs or symptoms. Please see discharge instructions. Patient expresses understanding.     Rodolph Bong, MD 10/15/14 2015  Rodolph Bong, MD 10/15/14 2026

## 2014-10-15 NOTE — ED Notes (Signed)
Here to be tested for STD; asymptomatic Partner was [facility]D [facility], no signs of acute distress.

## 2014-10-15 NOTE — Discharge Instructions (Signed)
Thank you for coming in today.   Gonorrhea Culture This is a test for Neisseria gonorrhoeae, the bacteria that causes the sexually transmitted disease gonorrhea. Gonorrhea is easily treated but can cause severe reproductive and other health problems if left untreated. A swab is used to get a sample of secretion or discharge from the infected area such as the cervix, urethra, penis, anus, or throat. A urine sample is used in some tests. Many caregivers will take a sample from more than one body site to increase the likelihood of finding the bacteria.  NORMAL FINDINGS Your culture should be negative. This means that this culture showed no growth for gonorrhea. Ranges for normal findings may vary among different laboratories and hospitals. You should always check with your doctor after having lab work or other tests done to discuss the meaning of your test results and whether your values are considered within normal limits. MEANING OF TEST  Your caregiver will go over the test results with you and discuss the importance and meaning of your results, as well as treatment options and the need for additional tests. OBTAINING THE TEST RESULTS It is your responsibility to obtain your test results. Ask the lab or department performing the test when and how you will get your results. Document Released: 05/26/2004 Document Revised: 07/17/2011 Document Reviewed: 07/30/2013 [facility] Patient Information 2015 [place], [place]. This information is not intended to replace advice given to you by your health care provider. Make sure you discuss any questions you have with your health care provider.

## 2014-10-16 LAB — URINE CYTOLOGY ANCILLARY ONLY
CHLAMYDIA, DNA PROBE: POSITIVE — AB
NEISSERIA GONORRHEA: NEGATIVE
Trichomonas: NEGATIVE

## 2014-10-16 LAB — HIV ANTIBODY (ROUTINE TESTING W REFLEX): HIV SCREEN 4TH GENERATION: NONREACTIVE

## 2014-10-16 LAB — RPR: RPR: NONREACTIVE

## 2014-10-16 NOTE — ED Notes (Signed)
Patient returned call, and after verifying ID, discussed positive chlamydia findings. Patient was reportedly here after her partner had advised her to be checked, and was already aware of probable infection. treatment adequate at day of visit, partner has already been treated. Form 2124 DHHS completed and faxed to [facility] for their records

## 2014-10-25 ENCOUNTER — Emergency Department (INDEPENDENT_AMBULATORY_CARE_PROVIDER_SITE_OTHER)
Admission: EM | Admit: 2014-10-25 | Discharge: 2014-10-25 | Disposition: A | Discharge status: Home / Self Care | Payer: Medicaid Other | Source: Home / Self Care | Attending: Family Medicine | Admitting: Family Medicine

## 2014-10-25 ENCOUNTER — Encounter (HOSPITAL_COMMUNITY): Payer: Self-pay | Admitting: Emergency Medicine

## 2014-10-25 DIAGNOSIS — L559 Sunburn, unspecified: Secondary | ICD-10-CM | POA: Diagnosis not present

## 2014-10-25 MED ORDER — TRAMADOL HCL 50 MG PO TABS: 50.0000 mg | ORAL_TABLET | Freq: Four times a day (QID) | ORAL | Status: DC | PRN

## 2014-10-25 MED ORDER — IBUPROFEN 800 MG PO TABS
800.0000 mg | ORAL_TABLET | Freq: Once | ORAL | Status: AC
  Administered 2014-10-25: 800 mg via ORAL

## 2014-10-25 MED ORDER — IBUPROFEN 800 MG PO TABS
ORAL_TABLET | ORAL | Status: AC
  Filled 2014-10-25: qty 1

## 2014-10-25 NOTE — Discharge Instructions (Signed)
Thank you for coming in today. Take up to 800 mg of ibuprofen every 8 hours. Use tramadol for severe pain. Avoid sun exposure. Go to the emergency room if you get worse.    Sunburn Sunburn is damage to the skin caused by overexposure to ultraviolet (UV) rays. People with light skin or a fair complexion may be more susceptible to sunburn. Repeated sun exposure causes early skin aging such as wrinkles and sun spots. It also increases the risk of skin cancer. CAUSES A sunburn is caused by getting too much UV radiation from the sun. SYMPTOMS  Red or pink skin.  Soreness and swelling.  Pain.  Blisters.  Peeling skin.  Headache, fever, and fatigue if sunburn covers a large area. TREATMENT  Your caregiver may tell you to take certain medicines to lessen inflammation.  Your caregiver may have you use hydrocortisone cream or spray to help with itching and inflammation.  Your caregiver may prescribe an antibiotic cream to use on blisters. HOME CARE INSTRUCTIONS   Avoid further exposure to the sun.  Cool baths and cool compresses may be helpful if used several times per day. Do not apply ice, since this may result in more damage to the skin.  Only take over-the-counter or prescription medicines for pain, discomfort, or fever as directed by your caregiver.  Use aloe or other over-the-counter sunburn creams or gels on your skin. Do not apply these creams or gels on blisters.  Drink enough fluids to keep your urine clear or pale yellow.  Do not break blisters. If blisters break, your caregiver may recommend an antibiotic cream to apply to the affected area. PREVENTION   Try to avoid the sun between 10:00 a.m. and 4:00 p.m. when it is the strongest.  Apply sunscreen at least 30 minutes before exposure to the sun.  Always wear protective hats, clothing, and sunglasses with UV protection.  Avoid medicines, herbs, and foods that increase your sensitivity to sunlight.  Avoid  tanning beds. SEEK IMMEDIATE MEDICAL CARE IF:   You have a fever.  Your pain is uncontrolled with medicine.  You start to vomit or have diarrhea.  You feel faint or develop a headache with confusion.  You develop severe blistering.  You have a pus-like (purulent) discharge coming from the blisters.  Your burn becomes more painful and swollen. MAKE SURE YOU:  Understand these instructions.  Will watch your condition.  Will get help right away if you are not doing well or get worse. Document Released: 02/01/2005 Document Revised: 08/19/2012 Document Reviewed: 10/16/2010 [facility] Patient Information 2015 [place], [place]. This information is not intended to replace advice given to you by your health care provider. Make sure you discuss any questions you have with your health care provider.

## 2014-10-25 NOTE — ED Notes (Signed)
Reports severe sunburn to back and buttocks.  Symptoms present since yesterday.  No relief with aloe or burn spray.

## 2014-10-25 NOTE — ED Provider Notes (Signed)
Shelly Andrews is a 34 y.o. female who presents to Urgent Care today for sunburn. Patient suffered a severe sunburn yesterday. The sunburn is located on her back as well as her legs. She's tried aloe vera, cold compress and over-the-counter sunburn really spray. These have not helped. She denies any headache vomiting fevers or chills. She rates her pain as severe. She does not think she'll be able to work tomorrow because of the sunburn.   Past Medical History  Diagnosis Date  . Thrombocytopenia     Idopathic with pregnancy  . No pertinent past medical history   . Pregnancy   . Depression   . Pregnancy, supervision of, high-risk 10/29/2012  . Splenomegaly 04/16/2013   Past Surgical History  Procedure Laterality Date  . No past surgeries     History  Substance Use Topics  . Smoking status: Never Smoker   . Smokeless tobacco: Never Used  . Alcohol Use: No     Comment: 1-2 on weekend   ROS as above Medications: Current Facility-Administered Medications  Medication Dose Route Frequency Provider Last Rate Last Dose  . ibuprofen (ADVIL,MOTRIN) tablet 800 mg  800 mg Oral Once Rodolph Bong, MD       Current Outpatient Prescriptions  Medication Sig Dispense Refill  . acetaminophen (TYLENOL) 325 MG tablet Take 650 mg by mouth every 6 (six) hours as needed for pain.    Marland Kitchen antipyrine-benzocaine (AURALGAN) otic solution Place 3-4 drops into the right ear every 2 (two) hours as needed for ear pain. 10 mL 0  . fluticasone (FLONASE) 50 MCG/ACT nasal spray Place 1-2 sprays into both nostrils daily. 16 g 2  . guaiFENesin-codeine 100-10 MG/5ML syrup Take 5-10 mLs by mouth 3 (three) times daily as needed for cough. 120 mL 0  . ipratropium (ATROVENT) 0.06 % nasal spray Place 2 sprays into both nostrils 4 (four) times daily. 15 mL 12  . traMADol (ULTRAM) 50 MG tablet Take 1 tablet (50 mg total) by mouth every 6 (six) hours as needed. 15 tablet 0   Allergies  Allergen Reactions  . Zoloft  [Sertraline Hcl] Hives  . Sulfa Antibiotics Hives     Exam:  BP 106/76 mmHg  Pulse 115  Temp(Src) 98.7 F (37.1 C) (Oral)  Resp 12  SpO2 100%  LMP 10/02/2014  Breastfeeding? No Gen: Well NAD nontoxic appearing HEENT: EOMI,  MMM Lungs: Normal work of breathing. CTABL Heart: Tachycardia no MRG Abd: NABS, Soft. Nondistended, Nontender Exts: Brisk capillary refill, warm and well perfused.  Skin: Bright red sunburn on back of torso and legs. No blistering. Tender to touch.  No results found for this or any previous visit (from the past 24 hour(s)). No results found.  Assessment and Plan: 34 y.o. female with sunburn. No significant systemic symptoms. She is mildly tachycardic however I believe this is due to pain. She was given 800 mg of oral ibuprofen prior to discharge and a prescription of tramadol for pain control. Work note provided. Watchful waiting return as needed.  Discussed warning signs or symptoms. Please see discharge instructions. Patient expresses understanding.     Rodolph Bong, MD 10/25/14 [phone]

## 2014-11-02 ENCOUNTER — Other Ambulatory Visit: Payer: Self-pay

## 2014-12-03 ENCOUNTER — Emergency Department (INDEPENDENT_AMBULATORY_CARE_PROVIDER_SITE_OTHER)
Admission: EM | Admit: 2014-12-03 | Discharge: 2014-12-03 | Disposition: A | Discharge status: Home / Self Care | Payer: Self-pay | Source: Home / Self Care | Attending: Family Medicine | Admitting: Family Medicine

## 2014-12-03 ENCOUNTER — Encounter (HOSPITAL_COMMUNITY): Payer: Self-pay | Admitting: *Deleted

## 2014-12-03 DIAGNOSIS — N390 Urinary tract infection, site not specified: Secondary | ICD-10-CM

## 2014-12-03 LAB — POCT URINALYSIS DIP (DEVICE)
BILIRUBIN URINE: NEGATIVE
Glucose, UA: NEGATIVE mg/dL
Ketones, ur: NEGATIVE mg/dL
NITRITE: NEGATIVE
PH: 7 (ref 5.0–8.0)
PROTEIN: NEGATIVE mg/dL
Specific Gravity, Urine: 1.025 (ref 1.005–1.030)
Urobilinogen, UA: 1 mg/dL (ref 0.0–1.0)

## 2014-12-03 LAB — POCT PREGNANCY, URINE: Preg Test, Ur: NEGATIVE

## 2014-12-03 MED ORDER — CEPHALEXIN 500 MG PO CAPS: 500.0000 mg | ORAL_CAPSULE | Freq: Four times a day (QID) | ORAL | Status: DC

## 2014-12-03 NOTE — ED Provider Notes (Signed)
CSN: [id number]     Arrival date & time 12/03/14  1300 History   First MD Initiated Contact with Patient 12/03/14 1314     Chief Complaint  Patient presents with  . Urinary Tract Infection   (Consider location/radiation/quality/duration/timing/severity/associated sxs/prior Treatment) Patient is a 34 y.o. female presenting with urinary tract infection. The history is provided by the patient.  Urinary Tract Infection Pain quality:  Burning Pain severity:  Mild Onset quality:  Gradual Duration:  2 days Progression:  Unchanged Chronicity:  New Recent urinary tract infections: no   Relieved by:  None tried Worsened by:  Nothing tried Ineffective treatments:  None tried Urinary symptoms: frequent urination   Urinary symptoms: no hematuria   Associated symptoms: no fever, no nausea, no vaginal discharge and no vomiting   Risk factors: no recurrent urinary tract infections     Past Medical History  Diagnosis Date  . Thrombocytopenia     Idopathic with pregnancy  . No pertinent past medical history   . Pregnancy   . Depression   . Pregnancy, supervision of, high-risk 10/29/2012  . Splenomegaly 04/16/2013   Past Surgical History  Procedure Laterality Date  . No past surgeries     Family History  Problem Relation Age of Onset  . Diabetes Mother   . Diabetes Maternal Grandmother    History  Substance Use Topics  . Smoking status: Never Smoker   . Smokeless tobacco: Never Used  . Alcohol Use: No     Comment: 1-2 on weekend   OB History    Gravida Para Term Preterm AB TAB SAB Ectopic Multiple Living   0 0 0 0 0 3     Review of Systems  Constitutional: Negative.  Negative for fever and chills.  Gastrointestinal: Negative.  Negative for nausea and vomiting.  Genitourinary: Positive for dysuria, urgency and frequency. Negative for vaginal bleeding and vaginal discharge.    Allergies  Zoloft and Sulfa antibiotics  Home Medications   Prior to Admission  medications   Medication Sig Start Date End Date Taking? Authorizing Provider  acetaminophen (TYLENOL) 325 MG tablet Take 650 mg by mouth every 6 (six) hours as needed for pain.    Historical Provider, MD  antipyrine-benzocaine Lyla Son) otic solution Place 3-4 drops into the right ear every 2 (two) hours as needed for ear pain. 12/23/13   Ozella Rocks, MD  cephALEXin (KEFLEX) 500 MG capsule Take 1 capsule (500 mg total) by mouth 4 (four) times daily. Take all of medicine and drink lots of fluids 12/03/14   Linna Hoff, MD  fluticasone [facility]) 50 MCG/ACT nasal spray Place 1-2 sprays into both nostrils daily. 12/23/13   Ozella Rocks, MD  guaiFENesin-codeine 100-10 MG/5ML syrup Take 5-10 mLs by mouth 3 (three) times daily as needed for cough. 12/23/13   Ozella Rocks, MD  ipratropium (ATROVENT) 0.06 % nasal spray Place 2 sprays into both nostrils 4 (four) times daily. 12/23/13   Ozella Rocks, MD  traMADol (ULTRAM) 50 MG tablet Take 1 tablet (50 mg total) by mouth every 6 (six) hours as needed. 10/25/14   Rodolph Bong, MD   BP 104/69 mmHg  Pulse 78  Temp(Src) 97.9 F (36.6 C) (Oral)  Resp 12  SpO2 98%  LMP 11/22/2014 Physical Exam  Constitutional: She is oriented to person, place, and time. She appears well-developed and well-nourished.  Abdominal: Soft. Bowel sounds are normal. She exhibits no mass. There is no tenderness.  There is no rebound and no guarding.  Neurological: She is alert and oriented to person, place, and time.  Skin: Skin is warm and dry.  Nursing note and vitals reviewed.   ED Course  Procedures (including critical care time) Labs Review Labs Reviewed  POCT URINALYSIS DIP (DEVICE) - Abnormal; Notable for the following:    Hgb urine dipstick SMALL (*)    Leukocytes, UA TRACE (*)    All other components within normal limits  POCT PREGNANCY, URINE    Imaging Review No results found.   MDM   1. UTI (lower urinary tract infection)        Linna Hoff, MD 12/03/14 1332

## 2014-12-03 NOTE — Discharge Instructions (Signed)
Take all of medicine as directed, drink lots of fluids, see your doctor if further problems. °

## 2014-12-03 NOTE — ED Notes (Signed)
Pt       Ambulated  To  Exam room   With a  Steady  Fluid  Gait   - pt  Reports  Symptoms  Of      Burning  And  Low  abd  Pain        With  Sensation  Of  Pressure           symptoms  Began      Yesterday          Has  Had  uti  In past

## 2015-08-14 IMAGING — CT CT ABD-PELV W/ CM
1 of 4 series · 14 of 32 positions shown, 19 images · IV contrast (OMNIPAQUE)
Comparison: Pelvic ultrasound performed 02/06/2013

CLINICAL DATA: Lower abdominal pain and cramping. Leukocytosis.
Recent postpartum status.

EXAM:
CT ABDOMEN AND PELVIS WITH CONTRAST
TECHNIQUE: Multidetector CT imaging of the abdomen and pelvis was performed
using the standard protocol following bolus administration of
intravenous contrast.
CONTRAST:  100mL OMNIPAQUE IOHEXOL 300 MG/ML  SOLN

[Series 2: routine abdomen/pelvis with · axial · 0.69mm/px · z∈[-434,-50]mm · 14 of 89 slices shown, 19 images]
[im 6/89  soft-tissue]
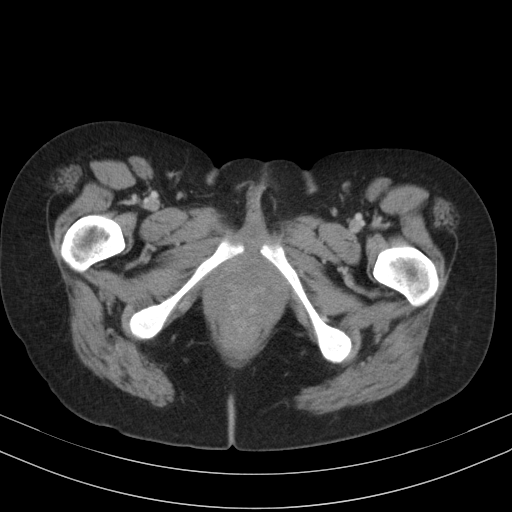
[im 6/89  bone]
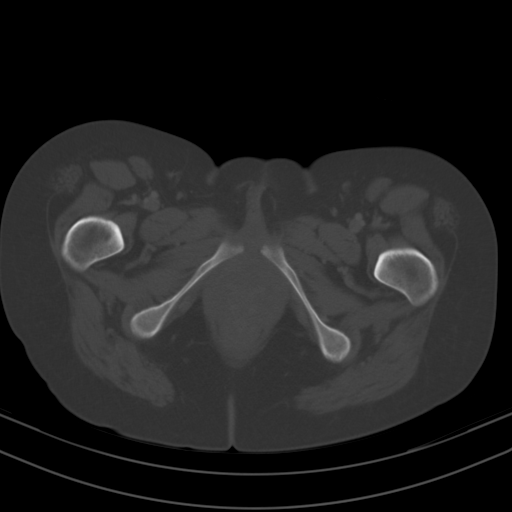
[im 12/89  soft-tissue]
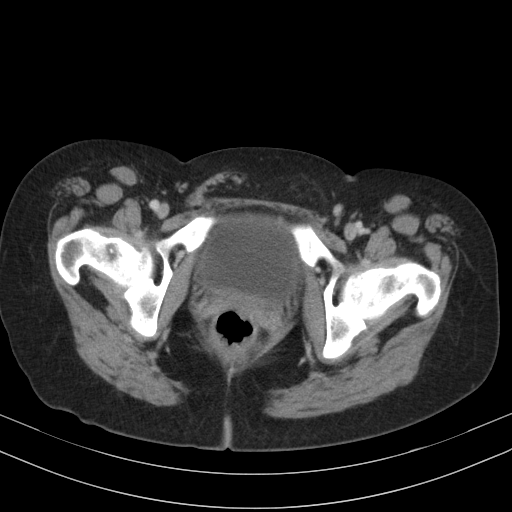
[im 17/89  soft-tissue]
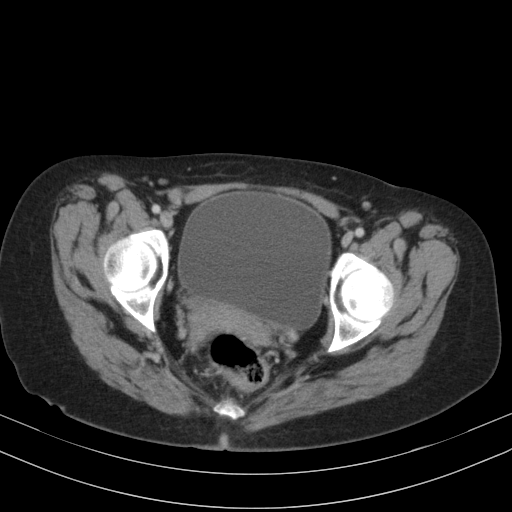
[im 28/89  soft-tissue]
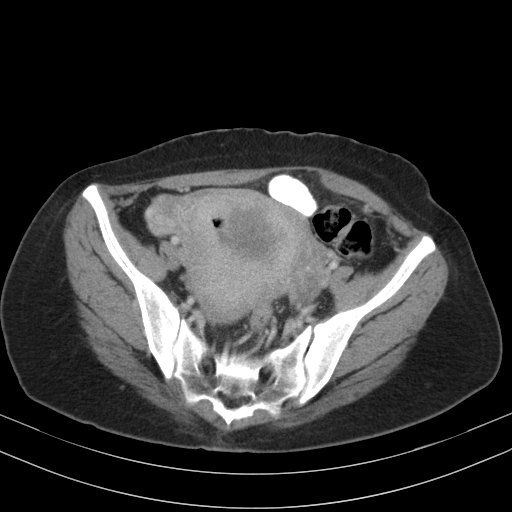
[im 34/89  soft-tissue]
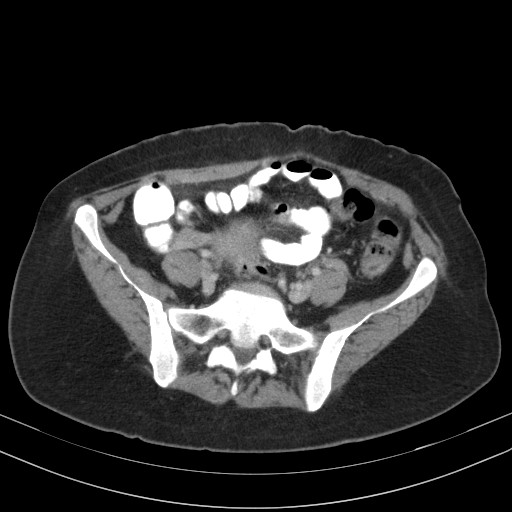
[im 39/89  soft-tissue]
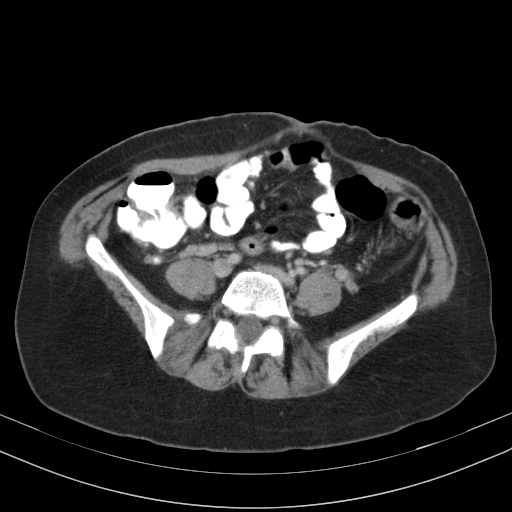
[im 45/89  soft-tissue]
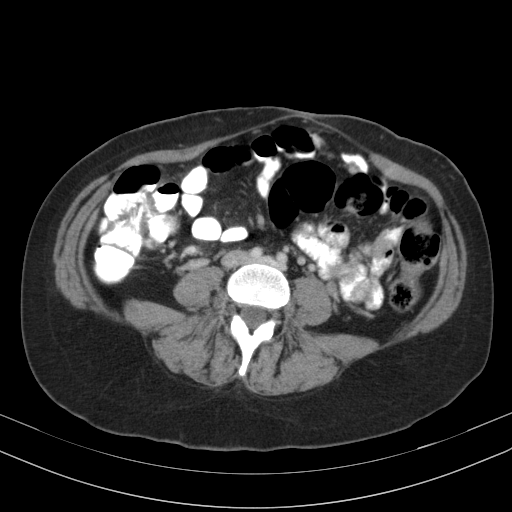
[im 50/89  soft-tissue]
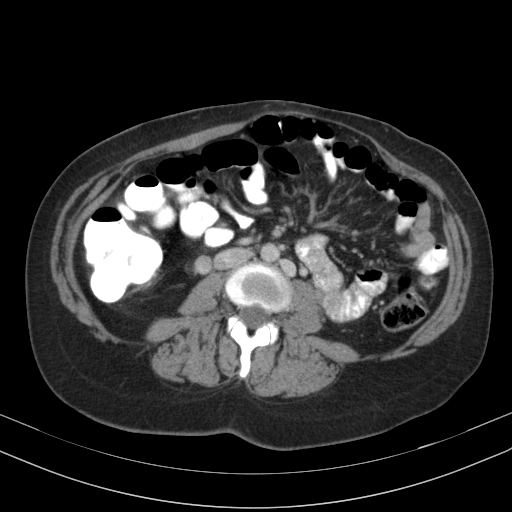
[im 56/89  soft-tissue]
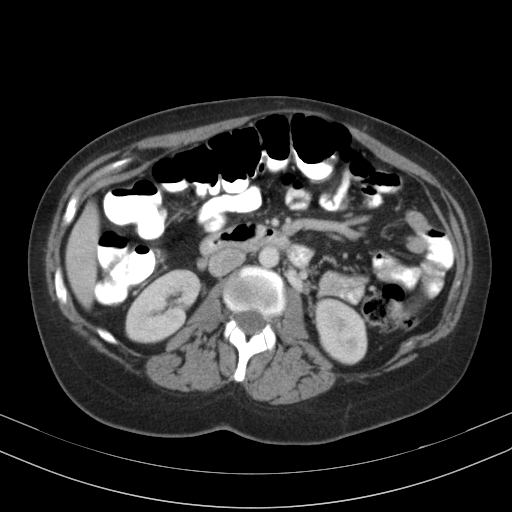
[im 56/89  bone]
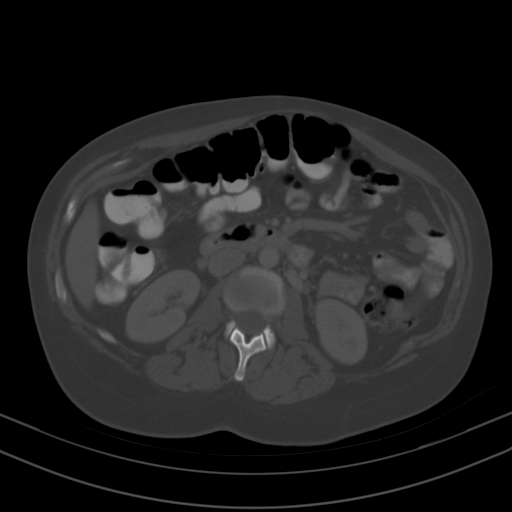
[im 61/89  soft-tissue]
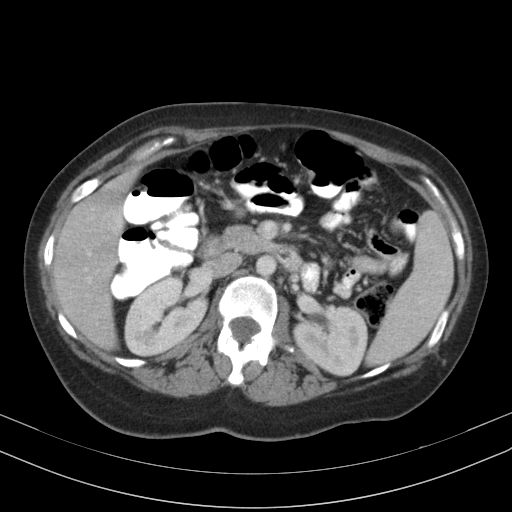
[im 67/89  lung]
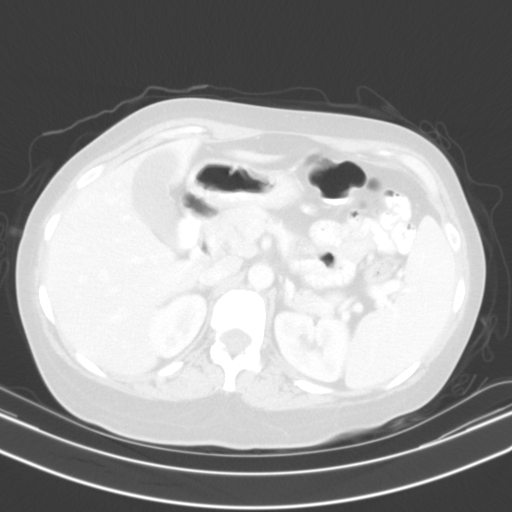
[im 72/89  soft-tissue]
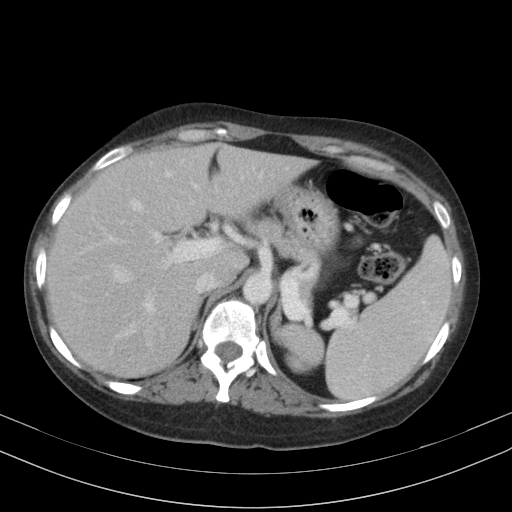
[im 72/89  lung]
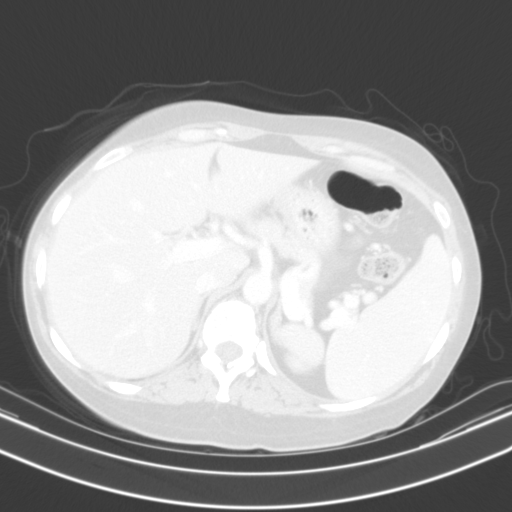
[im 78/89  soft-tissue]
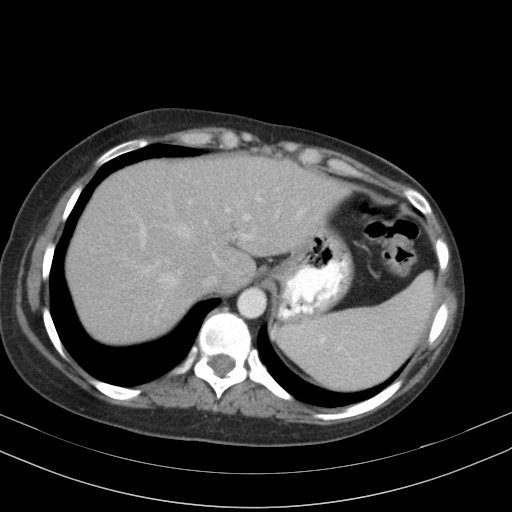
[im 78/89  lung]
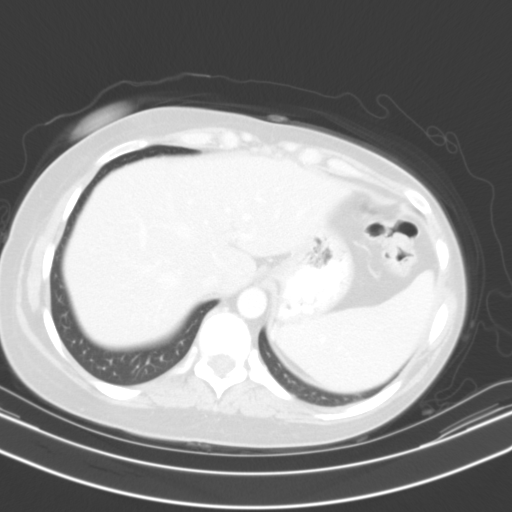
[im 83/89  soft-tissue]
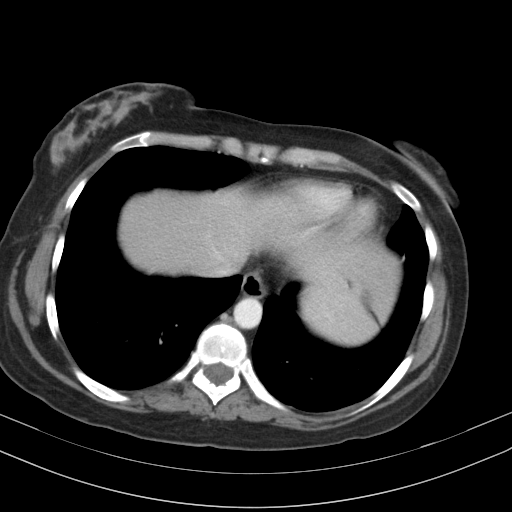
[im 83/89  lung]
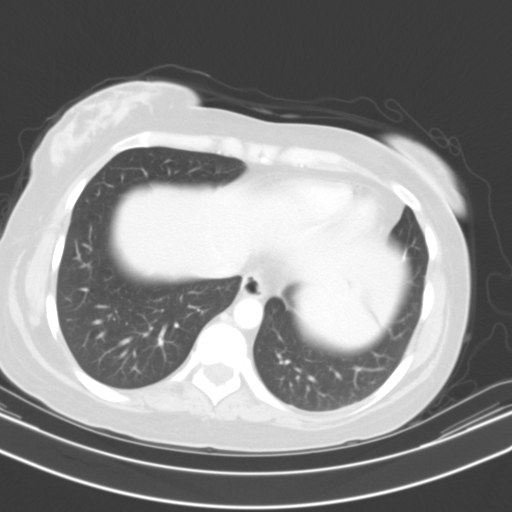

[14 of 32 positions shown; findings below may reference images not displayed]

FINDINGS: The visualized lung bases are clear.

The liver is unremarkable in appearance. The spleen is enlarged,
measuring 15.0 cm in length. The gallbladder is within normal
limits. The pancreas and adrenal glands are unremarkable.

The kidneys are unremarkable in appearance. There is no evidence of
hydronephrosis. No renal or ureteral stones are seen. No perinephric
stranding is appreciated.

No free fluid is identified. The small bowel is unremarkable in
appearance. The stomach is within normal limits. No acute vascular
abnormalities are seen.

The appendix is normal in caliber, without evidence for
appendicitis. The colon is unremarkable in appearance.

The bladder is moderately distended and grossly unremarkable in
appearance.

Air is noted within the endometrial canal, with irregularly
decreased attenuation extending along the anterior uterine wall.
This may reflect endometritis, given the patient's symptoms. The
uterus is mildly enlarged. The ovaries are relatively symmetric; no
suspicious adnexal masses are seen. No inguinal lymphadenopathy is
seen.

No acute osseous abnormalities are identified.
IMPRESSION: 1. Irregularly decreased attenuation extending along the anterior
uterine wall, with air noted in the endometrial canal. This may
reflect endometritis, given the patient's symptoms.
2. Splenomegaly noted.

## 2017-12-17 ENCOUNTER — Ambulatory Visit (HOSPITAL_COMMUNITY)
Admission: EM | Admit: 2017-12-17 | Discharge: 2017-12-17 | Disposition: A | Discharge status: Home / Self Care | Payer: Self-pay | Attending: Family Medicine | Admitting: Family Medicine

## 2017-12-17 ENCOUNTER — Encounter (HOSPITAL_COMMUNITY): Payer: Self-pay | Admitting: Emergency Medicine

## 2017-12-17 DIAGNOSIS — K529 Noninfective gastroenteritis and colitis, unspecified: Secondary | ICD-10-CM

## 2017-12-17 MED ORDER — ONDANSETRON 4 MG PO TBDP
4.0000 mg | ORAL_TABLET | Freq: Once | ORAL | Status: AC
  Administered 2017-12-17: 4 mg via ORAL

## 2017-12-17 MED ORDER — ONDANSETRON 4 MG PO TBDP: 4.0000 mg | ORAL_TABLET | Freq: Three times a day (TID) | ORAL | 0 refills | Status: DC | PRN

## 2017-12-17 MED ORDER — ONDANSETRON 4 MG PO TBDP
ORAL_TABLET | ORAL | Status: AC
  Filled 2017-12-17: qty 1

## 2017-12-17 NOTE — ED Triage Notes (Signed)
Pt sts N/V/D starting last night  

## 2017-12-17 NOTE — Discharge Instructions (Signed)
Small frequent sips of fluids- Pedialyte, Gatorade, water, broth- to maintain hydration.   Zofran as needed for nausea.  Limit food intake until symptoms have improved, may advance to bland diet as tolerated.  If worsening of pain, dehydration, vomiting or diarrhea return to be seen or go to Er.  If persistent symptoms to follow up with your primary care provider.

## 2017-12-17 NOTE — ED Provider Notes (Signed)
[facility]    CSN: [id number] Arrival date & time: 12/17/17  1243     History   Chief Complaint Chief Complaint  Patient presents with  . Emesis  . Diarrhea    HPI Shelly Andrews is a 37 y.o. female.   Shelly Andrews presents with complaints of onset of abdominal pain, diarrhea and vomiting which started this morning when she woke. Has had 2 episodes of loose diarrhea and has had 3 episodes vomiting. Pain has improved. Still with mild headache. No blood in vomit or stool. No fevers. Still with nausea. Has been able to drink small amount of water. Ate out last night and had a sub and french fries. Has had ill coworkers as well, works at a daycare. No urinary or vaginal symptoms. No previous abdominal history or surgeries. Hx of depression, splenomegaly, itp with pregnancy.    ROS per HPI.      Past Medical History:  Diagnosis Date  . Depression   . No pertinent past medical history   . Pregnancy   . Pregnancy, supervision of, high-risk 10/29/2012  . Splenomegaly 04/16/2013  . Thrombocytopenia (HCC)    Idopathic with pregnancy    Patient Active Problem List   Diagnosis Date Noted  . Splenomegaly 04/16/2013  . Pregnancy, supervision of, high-risk 10/29/2012  . Low vitamin B12 level 05/24/2012  . ITP (idiopathic thrombocytopenic purpura) 10/26/2011    Past Surgical History:  Procedure Laterality Date  . NO PAST SURGERIES      OB History    Gravida  4   Para  4   Term  1   Preterm  3   AB  0   Living  3     SAB  0   TAB  0   Ectopic  0   Multiple  0   Live Births  1            Home Medications    Prior to Admission medications   Medication Sig Start Date End Date Taking? Authorizing Provider  desvenlafaxine (PRISTIQ) 50 MG 24 hr tablet Take 50 mg by mouth daily.   Yes [provider]  acetaminophen (TYLENOL) 325 MG tablet Take 650 mg by mouth every 6 (six) hours as needed for pain.    [provider]    ondansetron (ZOFRAN-ODT) 4 MG disintegrating tablet Take 1 tablet (4 mg total) by mouth every 8 (eight) hours as needed for nausea or vomiting. 12/17/17   Georgetta HaberBurky, Natalie B, NP    Family History Family History  Problem Relation Age of Onset  . Diabetes Mother   . Diabetes Maternal Grandmother     Social History Social History   Tobacco Use  . Smoking status: Never Smoker  . Smokeless tobacco: Never Used  Substance Use Topics  . Alcohol use: No    Comment: 1-2 on weekend  . Drug use: No     Allergies   Zoloft [sertraline hcl] and Sulfa antibiotics   Review of Systems Review of Systems   Physical Exam Triage Vital Signs ED Triage Vitals [12/17/17 1314]  Enc Vitals Group     BP 109/73     Pulse Rate 87     Resp 18     Temp 97.8 F (36.6 C)     Temp Source Oral     SpO2 100 %     Weight      Height      Head Circumference  Peak Flow      Pain Score      Pain Loc      Pain Edu?      Excl. in GC?    No data found.  Updated Vital Signs BP 109/73 (BP Location: Left Arm)   Pulse 87   Temp 97.8 F (36.6 C) (Oral)   Resp 18   SpO2 100%   Physical Exam  Constitutional: She is oriented to person, place, and time. She appears well-developed and well-nourished. No distress.  Cardiovascular: Normal rate, regular rhythm and normal heart sounds.  Pulmonary/Chest: Effort normal and breath sounds normal.  Abdominal: Soft. Bowel sounds are normal. There is tenderness in the right lower quadrant and left lower quadrant. There is no rigidity, no rebound, no guarding, no CVA tenderness, no tenderness at McBurney's point and negative Murphy's sign.  Very mild bilateral low abdominal pain on palpation  Neurological: She is alert and oriented to person, place, and time.  Skin: Skin is warm and dry.     UC Treatments / Results  Labs (all labs ordered are listed, but only abnormal results are displayed) Labs Reviewed - No data to display  EKG None  Radiology No  results found.  Procedures Procedures (including critical care time)  Medications Ordered in UC Medications  ondansetron (ZOFRAN-ODT) disintegrating tablet 4 mg (has no administration in time range)    Initial Impression / Assessment and Plan / UC Course  I have reviewed the triage vital signs and the nursing notes.  Pertinent labs & imaging results that were available during my care of the patient were reviewed by me and considered in my medical decision making (see chart for details).     Non toxic in appearance. Afebrile. Has been tolerating some fluid intake this afternoon. Symptoms have somewhat improved. Appears consistent with a gastroenteritis, would expect improvement in the next 48-72 hours. zofran prn. Push fluids. Advance to bland diet as tolerated. Return precautions provided. Patient verbalized understanding and agreeable to plan.    Final Clinical Impressions(s) / UC Diagnoses   Final diagnoses:  Gastroenteritis     Discharge Instructions     Small frequent sips of fluids- Pedialyte, Gatorade, water, broth- to maintain hydration.   Zofran as needed for nausea.  Limit food intake until symptoms have improved, may advance to bland diet as tolerated.  If worsening of pain, dehydration, vomiting or diarrhea return to be seen or go to Er.  If persistent symptoms to follow up with your primary care provider.    ED Prescriptions    Medication Sig Dispense Auth. Provider   ondansetron (ZOFRAN-ODT) 4 MG disintegrating tablet Take 1 tablet (4 mg total) by mouth every 8 (eight) hours as needed for nausea or vomiting. 12 tablet Georgetta HaberBurky, Natalie B, NP     Controlled Substance Prescriptions [place] Controlled Substance Registry consulted? Not Applicable   Georgetta HaberBurky, Natalie B, NP 12/17/17 1410

## 2018-07-08 ENCOUNTER — Encounter (HOSPITAL_COMMUNITY): Payer: Self-pay

## 2018-07-08 ENCOUNTER — Emergency Department (HOSPITAL_COMMUNITY)
Admission: EM | Admit: 2018-07-08 | Discharge: 2018-07-08 | Disposition: A | Discharge status: Home / Self Care | Payer: Self-pay | Attending: Emergency Medicine | Admitting: Emergency Medicine

## 2018-07-08 ENCOUNTER — Other Ambulatory Visit: Payer: Self-pay

## 2018-07-08 DIAGNOSIS — J020 Streptococcal pharyngitis: Secondary | ICD-10-CM | POA: Insufficient documentation

## 2018-07-08 LAB — GROUP A STREP BY PCR: GROUP A STREP BY PCR: DETECTED — AB

## 2018-07-08 MED ORDER — AMOXICILLIN ER 775 MG PO TB24: 775.0000 mg | ORAL_TABLET | Freq: Every day | ORAL | 0 refills | Status: AC

## 2018-07-08 NOTE — ED Triage Notes (Signed)
Pt presents to ED with complaints of sore throat, fever up to 101, and headache which started on Friday. Pt works at a daycare.

## 2018-07-08 NOTE — Discharge Instructions (Addendum)
Dear Shelly Andrews  You came to [place] with sore throat. We have determined this was caused by strep. Here are our recommendations for you at discharge:  Please take amoxicillin 775mg  once a day for 10 days  Thank you for choosing [facility].

## 2018-07-08 NOTE — ED Provider Notes (Addendum)
[facility] EMERGENCY DEPARTMENT Provider Note   CSN: [id number] Arrival date & time: 07/08/18  1304    History   Chief Complaint Chief Complaint  Patient presents with  . Sore Throat    HPI Shelly Andrews is a 38 y.o. female w/ no significant PMH presenting with complaints of sore throat. She was in her usual state of health until last Friday when she began to experience headache. Over the weekend she had progressively worsening systemic symptoms with malaise, fever (recorded at home 101.9), occasional non-productive cough and nausea without vomiting. This morning she woke up and noticed a bump on her left neck and was concerned for strep so she came to the ED for evaluation. Denies any chest pain, palpitations, dyspnea, vomiting, diarrhea.    Past Medical History:  Diagnosis Date  . Depression   . No pertinent past medical history   . Pregnancy   . Pregnancy, supervision of, high-risk 10/29/2012  . Splenomegaly 04/16/2013  . Thrombocytopenia (HCC)    Idopathic with pregnancy    Patient Active Problem List   Diagnosis Date Noted  . Splenomegaly 04/16/2013  . Pregnancy, supervision of, high-risk 10/29/2012  . Low vitamin B12 level 05/24/2012  . ITP (idiopathic thrombocytopenic purpura) 10/26/2011    Past Surgical History:  Procedure Laterality Date  . NO PAST SURGERIES       OB History    Gravida  4   Para  4   Term  1   Preterm  3   AB  0   Living  3     SAB  0   TAB  0   Ectopic  0   Multiple  0   Live Births  1            Home Medications    Prior to Admission medications   Medication Sig Start Date End Date Taking? Authorizing Provider  acetaminophen (TYLENOL) 325 MG tablet Take 650 mg by mouth every 6 (six) hours as needed for pain.    [provider]  amoxicillin (MOXATAG) 775 MG 24 hr tablet Take 1 tablet (775 mg total) by mouth daily for 10 days. 07/08/18 07/18/18  Theotis Barrio, MD  desvenlafaxine (PRISTIQ) 50 MG 24 hr  tablet Take 50 mg by mouth daily.    [provider]  ondansetron (ZOFRAN-ODT) 4 MG disintegrating tablet Take 1 tablet (4 mg total) by mouth every 8 (eight) hours as needed for nausea or vomiting. 12/17/17   Georgetta Haber, NP    Family History Family History  Problem Relation Age of Onset  . Diabetes Mother   . Diabetes Maternal Grandmother     Social History Social History   Tobacco Use  . Smoking status: Never Smoker  . Smokeless tobacco: Never Used  Substance Use Topics  . Alcohol use: Yes    Comment: 1-2 on weekend  . Drug use: No     Allergies   Zoloft [sertraline hcl] and Sulfa antibiotics   Review of Systems Review of Systems  Constitutional: Positive for chills, fatigue and fever.  HENT: Positive for sore throat. Negative for rhinorrhea, sinus pressure, sinus pain, sneezing and trouble swallowing.   Respiratory: Positive for cough (non-productive). Negative for shortness of breath.   Cardiovascular: Negative for chest pain and palpitations.  Gastrointestinal: Positive for nausea. Negative for diarrhea and vomiting.  Neurological: Positive for headaches. Negative for dizziness, weakness and numbness.     Physical Exam Updated Vital Signs BP 114/83 (BP  Location: Right Arm)   Pulse 100   Temp 98.6 F (37 C) (Oral)   Resp 18   Ht 5\' 3"  (1.6 m)   Wt 63.5 kg   SpO2 99%   BMI 24.80 kg/m   Physical Exam Constitutional:      General: She is not in acute distress.    Appearance: She is well-developed. She is not ill-appearing.  HENT:     Head: Normocephalic and atraumatic.     Nose: No congestion.     Mouth/Throat:     Mouth: Mucous membranes are moist. No oral lesions.     Pharynx: Oropharynx is clear. No oropharyngeal exudate or posterior oropharyngeal erythema.     Tonsils: No tonsillar exudate or tonsillar abscesses. Swelling: 2+ on the right. 2+ on the left.  Eyes:     Conjunctiva/sclera: Conjunctivae normal.  Neck:     Musculoskeletal:  Normal range of motion and neck supple.  Cardiovascular:     Rate and Rhythm: Regular rhythm. Tachycardia present.     Heart sounds: Normal heart sounds.  Pulmonary:     Effort: Pulmonary effort is normal. No respiratory distress.     Breath sounds: Normal breath sounds. No wheezing.  Abdominal:     General: There is no distension.     Palpations: Abdomen is soft.     Tenderness: There is no abdominal tenderness.  Lymphadenopathy:     Cervical: Cervical adenopathy (left sided cervical adenopathy with tenderness and mass) present.  Skin:    General: Skin is warm and dry.  Neurological:     Mental Status: She is alert.      ED Treatments / Results  Labs (all labs ordered are listed, but only abnormal results are displayed) Labs Reviewed  GROUP A STREP BY PCR - Abnormal; Notable for the following components:      Result Value   Group A Strep by PCR DETECTED (*)    All other components within normal limits    EKG None  Radiology No results found.  Procedures Procedures (including critical care time)  Medications Ordered in ED Medications - No data to display   Initial Impression / Assessment and Plan / ED Course  I have reviewed the triage vital signs and the nursing notes.  Pertinent labs & imaging results that were available during my care of the patient were reviewed by me and considered in my medical decision making (see chart for details).    Shelly Andrews presents with sore throat. 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 Most likely upper respiratory viral illness but does score 3 points on Centor. Will follow up with Strep PCR.     Final Clinical Impressions(s) / ED Diagnoses   Final diagnoses:  Strep pharyngitis   Strep PCR is positive. Final diagnosis strep pharyngitis. Will discharge with 10 day course of amoxicillin.  ED Discharge Orders         Ordered    amoxicillin (MOXATAG) 775 MG 24 hr tablet  Daily     07/08/18 1435             Theotis Barrio, MD 07/08/18 1439    Theotis Barrio, MD 07/08/18 1440    Blane Ohara, MD 07/09/18 [phone]

## 2018-10-29 ENCOUNTER — Ambulatory Visit (HOSPITAL_COMMUNITY)
Admission: AD | Admit: 2018-10-29 | Discharge: 2018-10-30 | Disposition: A | Discharge status: Home / Self Care | Payer: Self-pay | Attending: Family Medicine | Admitting: Family Medicine

## 2018-10-29 ENCOUNTER — Other Ambulatory Visit: Payer: Self-pay

## 2018-10-29 DIAGNOSIS — D693 Immune thrombocytopenic purpura: Secondary | ICD-10-CM | POA: Insufficient documentation

## 2018-10-29 DIAGNOSIS — O009 Unspecified ectopic pregnancy without intrauterine pregnancy: Secondary | ICD-10-CM

## 2018-10-29 DIAGNOSIS — Z975 Presence of (intrauterine) contraceptive device: Secondary | ICD-10-CM

## 2018-10-29 DIAGNOSIS — O00101 Right tubal pregnancy without intrauterine pregnancy: Principal | ICD-10-CM | POA: Insufficient documentation

## 2018-10-29 DIAGNOSIS — Z3A01 Less than 8 weeks gestation of pregnancy: Secondary | ICD-10-CM | POA: Insufficient documentation

## 2018-10-29 DIAGNOSIS — Z9079 Acquired absence of other genital organ(s): Secondary | ICD-10-CM

## 2018-10-29 DIAGNOSIS — O99111 Other diseases of the blood and blood-forming organs and certain disorders involving the immune mechanism complicating pregnancy, first trimester: Secondary | ICD-10-CM | POA: Insufficient documentation

## 2018-10-29 DIAGNOSIS — O99341 Other mental disorders complicating pregnancy, first trimester: Secondary | ICD-10-CM | POA: Insufficient documentation

## 2018-10-29 DIAGNOSIS — O26899 Other specified pregnancy related conditions, unspecified trimester: Secondary | ICD-10-CM

## 2018-10-29 DIAGNOSIS — Z833 Family history of diabetes mellitus: Secondary | ICD-10-CM | POA: Insufficient documentation

## 2018-10-29 DIAGNOSIS — O2630 Retained intrauterine contraceptive device in pregnancy, unspecified trimester: Secondary | ICD-10-CM

## 2018-10-29 DIAGNOSIS — Z888 Allergy status to other drugs, medicaments and biological substances status: Secondary | ICD-10-CM | POA: Insufficient documentation

## 2018-10-29 DIAGNOSIS — F329 Major depressive disorder, single episode, unspecified: Secondary | ICD-10-CM | POA: Insufficient documentation

## 2018-10-29 DIAGNOSIS — E861 Hypovolemia: Secondary | ICD-10-CM

## 2018-10-29 DIAGNOSIS — O26891 Other specified pregnancy related conditions, first trimester: Secondary | ICD-10-CM | POA: Insufficient documentation

## 2018-10-29 DIAGNOSIS — Z882 Allergy status to sulfonamides status: Secondary | ICD-10-CM | POA: Insufficient documentation

## 2018-10-29 DIAGNOSIS — Z6791 Unspecified blood type, Rh negative: Secondary | ICD-10-CM

## 2018-10-29 DIAGNOSIS — Z1159 Encounter for screening for other viral diseases: Secondary | ICD-10-CM | POA: Insufficient documentation

## 2018-10-29 DIAGNOSIS — I9589 Other hypotension: Secondary | ICD-10-CM

## 2018-10-29 MED ORDER — LACTATED RINGERS IV SOLN: INTRAVENOUS | Status: DC

## 2018-10-30 ENCOUNTER — Inpatient Hospital Stay (HOSPITAL_COMMUNITY): Payer: Self-pay | Admitting: Certified Registered Nurse Anesthetist

## 2018-10-30 ENCOUNTER — Inpatient Hospital Stay (HOSPITAL_COMMUNITY): Payer: Self-pay

## 2018-10-30 ENCOUNTER — Encounter (HOSPITAL_COMMUNITY)
Admission: AD | Disposition: A | Discharge status: Home / Self Care | Payer: Self-pay | Source: Home / Self Care | Attending: Family Medicine

## 2018-10-30 ENCOUNTER — Other Ambulatory Visit: Payer: Self-pay

## 2018-10-30 ENCOUNTER — Encounter (HOSPITAL_COMMUNITY): Payer: Self-pay | Admitting: *Deleted

## 2018-10-30 ENCOUNTER — Encounter (HOSPITAL_COMMUNITY): Payer: Self-pay | Admitting: Anesthesiology

## 2018-10-30 DIAGNOSIS — O2630 Retained intrauterine contraceptive device in pregnancy, unspecified trimester: Secondary | ICD-10-CM

## 2018-10-30 DIAGNOSIS — Z9079 Acquired absence of other genital organ(s): Secondary | ICD-10-CM

## 2018-10-30 DIAGNOSIS — Z30432 Encounter for removal of intrauterine contraceptive device: Secondary | ICD-10-CM

## 2018-10-30 DIAGNOSIS — O00101 Right tubal pregnancy without intrauterine pregnancy: Secondary | ICD-10-CM

## 2018-10-30 HISTORY — PX: LAPAROSCOPIC UNILATERAL SALPINGO OOPHERECTOMY: SHX5935

## 2018-10-30 HISTORY — PX: IUD REMOVAL: SHX5392

## 2018-10-30 LAB — TYPE AND SCREEN
ABO/RH(D): O NEG
Antibody Screen: NEGATIVE
Unit division: 0
Unit division: 0
Unit division: 0
Unit division: 0

## 2018-10-30 LAB — BPAM RBC
Blood Product Expiration Date: 202006302359
Blood Product Expiration Date: 202006302359
Blood Product Expiration Date: 202007012359
Blood Product Expiration Date: 202007032359
ISSUE DATE / TIME: 202006240039
ISSUE DATE / TIME: 202006240039
ISSUE DATE / TIME: 202006240159
ISSUE DATE / TIME: 202006240159
Unit Type and Rh: 9500
Unit Type and Rh: 9500
Unit Type and Rh: 9500
Unit Type and Rh: 9500

## 2018-10-30 LAB — CBC
HCT: 36.6 % (ref 36.0–46.0)
Hemoglobin: 12 g/dL (ref 12.0–15.0)
MCH: 31.5 pg (ref 26.0–34.0)
MCHC: 32.8 g/dL (ref 30.0–36.0)
MCV: 96.1 fL (ref 80.0–100.0)
Platelets: 287 10*3/uL (ref 150–400)
RBC: 3.81 MIL/uL — ABNORMAL LOW (ref 3.87–5.11)
RDW: 12 % (ref 11.5–15.5)
WBC: 22.1 10*3/uL — ABNORMAL HIGH (ref 4.0–10.5)
nRBC: 0 % (ref 0.0–0.2)

## 2018-10-30 LAB — HCG, QUANTITATIVE, PREGNANCY: hCG, Beta Chain, Quant, S: 3455 m[IU]/mL — ABNORMAL HIGH (ref <5)

## 2018-10-30 LAB — ABO/RH: ABO/RH(D): O NEG

## 2018-10-30 LAB — SARS CORONAVIRUS 2 BY RT PCR (HOSPITAL ORDER, PERFORMED IN ~~LOC~~ HOSPITAL LAB): SARS Coronavirus 2: NEGATIVE

## 2018-10-30 SURGERY — SALPINGO-OOPHORECTOMY, UNILATERAL, LAPAROSCOPIC
Anesthesia: General | Laterality: Right

## 2018-10-30 MED ORDER — ONDANSETRON HCL 4 MG/2ML IJ SOLN: 4.0000 mg | Freq: Once | INTRAMUSCULAR | Status: DC | PRN

## 2018-10-30 MED ORDER — ONDANSETRON HCL 4 MG/2ML IJ SOLN
INTRAMUSCULAR | Status: AC
  Filled 2018-10-30: qty 2

## 2018-10-30 MED ORDER — ONDANSETRON HCL 4 MG/2ML IJ SOLN: 4.0000 mg | Freq: Once | INTRAMUSCULAR | Status: DC

## 2018-10-30 MED ORDER — DEXAMETHASONE SODIUM PHOSPHATE 10 MG/ML IJ SOLN
INTRAMUSCULAR | Status: DC | PRN
  Administered 2018-10-30: 10 mg via INTRAVENOUS

## 2018-10-30 MED ORDER — RHO D IMMUNE GLOBULIN 1500 UNIT/2ML IJ SOSY
300.0000 ug | PREFILLED_SYRINGE | Freq: Once | INTRAMUSCULAR | Status: DC
  Filled 2018-10-30: qty 2

## 2018-10-30 MED ORDER — LACTATED RINGERS IV BOLUS
1000.0000 mL | Freq: Once | INTRAVENOUS | Status: AC
  Administered 2018-10-30: 1000 mL via INTRAVENOUS

## 2018-10-30 MED ORDER — SODIUM CHLORIDE 0.9% IV SOLUTION: Freq: Once | INTRAVENOUS | Status: AC

## 2018-10-30 MED ORDER — FENTANYL CITRATE (PF) 100 MCG/2ML IJ SOLN
INTRAMUSCULAR | Status: AC
  Filled 2018-10-30: qty 2

## 2018-10-30 MED ORDER — ALBUMIN HUMAN 5 % IV SOLN
INTRAVENOUS | Status: DC | PRN
  Administered 2018-10-30 (×2): via INTRAVENOUS

## 2018-10-30 MED ORDER — OXYCODONE HCL 5 MG PO TABS: 5.0000 mg | ORAL_TABLET | Freq: Once | ORAL | Status: DC | PRN

## 2018-10-30 MED ORDER — OXYCODONE HCL 5 MG/5ML PO SOLN: 5.0000 mg | Freq: Once | ORAL | Status: DC | PRN

## 2018-10-30 MED ORDER — OXYCODONE-ACETAMINOPHEN 5-325 MG PO TABS
1.0000 | ORAL_TABLET | ORAL | Status: DC | PRN
  Administered 2018-10-30: 1 via ORAL
  Filled 2018-10-30: qty 1

## 2018-10-30 MED ORDER — SODIUM CHLORIDE 0.9 % IR SOLN
Status: DC | PRN
  Administered 2018-10-30: 1000 mL

## 2018-10-30 MED ORDER — FENTANYL CITRATE (PF) 250 MCG/5ML IJ SOLN
INTRAMUSCULAR | Status: DC | PRN
  Administered 2018-10-30 (×2): 100 ug via INTRAVENOUS
  Administered 2018-10-30: 50 ug via INTRAVENOUS

## 2018-10-30 MED ORDER — OXYCODONE-ACETAMINOPHEN 5-325 MG PO TABS: 1.0000 | ORAL_TABLET | ORAL | 0 refills | Status: DC | PRN

## 2018-10-30 MED ORDER — MORPHINE SULFATE (PF) 4 MG/ML IV SOLN
2.0000 mg | Freq: Once | INTRAVENOUS | Status: AC
  Administered 2018-10-30: 2 mg via INTRAVENOUS
  Filled 2018-10-30: qty 1

## 2018-10-30 MED ORDER — KETOROLAC TROMETHAMINE 30 MG/ML IJ SOLN
30.0000 mg | Freq: Once | INTRAMUSCULAR | Status: AC
  Administered 2018-10-30: 30 mg via INTRAVENOUS
  Filled 2018-10-30: qty 1

## 2018-10-30 MED ORDER — MIDAZOLAM HCL 2 MG/2ML IJ SOLN
INTRAMUSCULAR | Status: AC
  Filled 2018-10-30: qty 2

## 2018-10-30 MED ORDER — FENTANYL CITRATE (PF) 250 MCG/5ML IJ SOLN
INTRAMUSCULAR | Status: AC
  Filled 2018-10-30: qty 5

## 2018-10-30 MED ORDER — SODIUM CHLORIDE 0.9 % IV SOLN
Freq: Once | INTRAVENOUS | Status: AC
  Administered 2018-10-30: 01:00:00 via INTRAVENOUS

## 2018-10-30 MED ORDER — SUCCINYLCHOLINE 20MG/ML (10ML) SYRINGE FOR MEDFUSION PUMP - OPTIME
INTRAMUSCULAR | Status: DC | PRN
  Administered 2018-10-30: 120 mg via INTRAVENOUS

## 2018-10-30 MED ORDER — LIDOCAINE 2% (20 MG/ML) 5 ML SYRINGE
INTRAMUSCULAR | Status: AC
  Filled 2018-10-30: qty 5

## 2018-10-30 MED ORDER — EPHEDRINE SULFATE 50 MG/ML IJ SOLN
INTRAMUSCULAR | Status: DC | PRN
  Administered 2018-10-30: 20 mg via INTRAVENOUS

## 2018-10-30 MED ORDER — ONDANSETRON HCL 4 MG/2ML IJ SOLN
4.0000 mg | Freq: Once | INTRAMUSCULAR | Status: AC
  Administered 2018-10-30: 4 mg via INTRAVENOUS
  Filled 2018-10-30: qty 2

## 2018-10-30 MED ORDER — PHENYLEPHRINE HCL (PRESSORS) 10 MG/ML IV SOLN
INTRAVENOUS | Status: DC | PRN
  Administered 2018-10-30: 80 ug via INTRAVENOUS
  Administered 2018-10-30: 160 ug via INTRAVENOUS
  Administered 2018-10-30: 80 ug via INTRAVENOUS

## 2018-10-30 MED ORDER — DOCUSATE SODIUM 100 MG PO CAPS: 100.0000 mg | ORAL_CAPSULE | Freq: Two times a day (BID) | ORAL | 2 refills | Status: DC | PRN

## 2018-10-30 MED ORDER — SUGAMMADEX SODIUM 200 MG/2ML IV SOLN
INTRAVENOUS | Status: DC | PRN
  Administered 2018-10-30: 200 mg via INTRAVENOUS

## 2018-10-30 MED ORDER — BUPIVACAINE HCL (PF) 0.25 % IJ SOLN
INTRAMUSCULAR | Status: AC
  Filled 2018-10-30: qty 30

## 2018-10-30 MED ORDER — LACTATED RINGERS IV SOLN
INTRAVENOUS | Status: DC | PRN
  Administered 2018-10-30: 03:00:00 via INTRAVENOUS

## 2018-10-30 MED ORDER — ROCURONIUM BROMIDE 10 MG/ML (PF) SYRINGE
PREFILLED_SYRINGE | INTRAVENOUS | Status: AC
  Filled 2018-10-30: qty 10

## 2018-10-30 MED ORDER — IBUPROFEN 600 MG PO TABS: 600.0000 mg | ORAL_TABLET | Freq: Four times a day (QID) | ORAL | 3 refills | Status: DC | PRN

## 2018-10-30 MED ORDER — HYDROMORPHONE HCL 1 MG/ML IJ SOLN
1.0000 mg | Freq: Once | INTRAMUSCULAR | Status: AC
  Administered 2018-10-30: 1 mg via INTRAVENOUS
  Filled 2018-10-30: qty 1

## 2018-10-30 MED ORDER — PROPOFOL 10 MG/ML IV BOLUS
INTRAVENOUS | Status: AC
  Filled 2018-10-30: qty 20

## 2018-10-30 MED ORDER — DEXAMETHASONE SODIUM PHOSPHATE 10 MG/ML IJ SOLN
INTRAMUSCULAR | Status: AC
  Filled 2018-10-30: qty 1

## 2018-10-30 MED ORDER — MIDAZOLAM HCL 2 MG/2ML IJ SOLN
INTRAMUSCULAR | Status: DC | PRN
  Administered 2018-10-30: 2 mg via INTRAVENOUS

## 2018-10-30 MED ORDER — BUPIVACAINE HCL (PF) 0.25 % IJ SOLN
INTRAMUSCULAR | Status: DC | PRN
  Administered 2018-10-30: 30 mL

## 2018-10-30 MED ORDER — FENTANYL CITRATE (PF) 100 MCG/2ML IJ SOLN
25.0000 ug | INTRAMUSCULAR | Status: DC | PRN
  Administered 2018-10-30 (×3): 50 ug via INTRAVENOUS

## 2018-10-30 MED ORDER — ROCURONIUM 10MG/ML (10ML) SYRINGE FOR MEDFUSION PUMP - OPTIME
INTRAVENOUS | Status: DC | PRN
  Administered 2018-10-30: 30 mg via INTRAVENOUS

## 2018-10-30 MED ORDER — PROPOFOL 10 MG/ML IV BOLUS
INTRAVENOUS | Status: DC | PRN
  Administered 2018-10-30: 130 mg via INTRAVENOUS

## 2018-10-30 MED ORDER — LIDOCAINE HCL (CARDIAC) PF 100 MG/5ML IV SOSY
PREFILLED_SYRINGE | INTRAVENOUS | Status: DC | PRN
  Administered 2018-10-30: 100 mg via INTRATRACHEAL

## 2018-10-30 MED ORDER — FENTANYL CITRATE (PF) 100 MCG/2ML IJ SOLN: 25.0000 ug | INTRAMUSCULAR | Status: DC | PRN

## 2018-10-30 MED ORDER — ONDANSETRON HCL 4 MG/2ML IJ SOLN
INTRAMUSCULAR | Status: DC | PRN
  Administered 2018-10-30: 4 mg via INTRAVENOUS

## 2018-10-30 SURGICAL SUPPLY — 36 items
ADH SKN CLS APL DERMABOND .7 (GAUZE/BANDAGES/DRESSINGS) ×2
APL SRG 38 LTWT LNG FL B (MISCELLANEOUS) ×2
APPLICATOR ARISTA FLEXITIP XL (MISCELLANEOUS) ×4 IMPLANT
BAG SPEC RTRVL LRG 6X4 10 (ENDOMECHANICALS) ×2
DERMABOND ADVANCED (GAUZE/BANDAGES/DRESSINGS) ×2
DERMABOND ADVANCED .7 DNX12 (GAUZE/BANDAGES/DRESSINGS) ×2 IMPLANT
DRSG OPSITE POSTOP 3X4 (GAUZE/BANDAGES/DRESSINGS) ×3 IMPLANT
DURAPREP 26ML APPLICATOR (WOUND CARE) ×4 IMPLANT
ELECT REM PT RETURN 9FT ADLT (ELECTROSURGICAL) ×4
ELECTRODE REM PT RTRN 9FT ADLT (ELECTROSURGICAL) ×1 IMPLANT
GLOVE BIOGEL PI IND STRL 6.5 (GLOVE) ×4 IMPLANT
GLOVE BIOGEL PI IND STRL 7.0 (GLOVE) ×4 IMPLANT
GLOVE BIOGEL PI INDICATOR 6.5 (GLOVE) ×4
GLOVE BIOGEL PI INDICATOR 7.0 (GLOVE) ×4
GLOVE SURG SS PI 6.0 STRL IVOR (GLOVE) ×4 IMPLANT
GOWN STRL REUS W/ TWL LRG LVL3 (GOWN DISPOSABLE) ×4 IMPLANT
GOWN STRL REUS W/TWL LRG LVL3 (GOWN DISPOSABLE) ×8
HEMOSTAT ARISTA ABSORB 3G PWDR (HEMOSTASIS) ×3 IMPLANT
HIBICLENS CHG 4% 4OZ BTL (MISCELLANEOUS) ×4 IMPLANT
KIT TURNOVER KIT B (KITS) ×4 IMPLANT
NS IRRIG 1000ML POUR BTL (IV SOLUTION) ×4 IMPLANT
PACK LAPAROSCOPY BASIN (CUSTOM PROCEDURE TRAY) ×4 IMPLANT
PACK TRENDGUARD 450 HYBRID PRO (MISCELLANEOUS) IMPLANT
POUCH SPECIMEN RETRIEVAL 10MM (ENDOMECHANICALS) ×4 IMPLANT
PROTECTOR NERVE ULNAR (MISCELLANEOUS) ×8 IMPLANT
SET IRRIG TUBING LAPAROSCOPIC (IRRIGATION / IRRIGATOR) ×2 IMPLANT
SET TUBE SMOKE EVAC HIGH FLOW (TUBING) ×4 IMPLANT
SHEARS HARMONIC ACE PLUS 36CM (ENDOMECHANICALS) ×4 IMPLANT
SLEEVE ENDOPATH XCEL 5M (ENDOMECHANICALS) ×4 IMPLANT
SUT MNCRL AB 4-0 PS2 18 (SUTURE) ×4 IMPLANT
SUT VICRYL 0 UR6 27IN ABS (SUTURE) ×8 IMPLANT
TOWEL GREEN STERILE FF (TOWEL DISPOSABLE) ×8 IMPLANT
TRAY FOLEY W/BAG SLVR 14FR (SET/KITS/TRAYS/PACK) ×4 IMPLANT
TRENDGUARD 450 HYBRID PRO PACK (MISCELLANEOUS) ×4
TROCAR BALLN 12MMX100 BLUNT (TROCAR) ×4 IMPLANT
TROCAR XCEL NON-BLD 5MMX100MML (ENDOMECHANICALS) ×4 IMPLANT

## 2018-10-30 NOTE — Transfer of Care (Signed)
Immediate Anesthesia Transfer of Care Note  Patient: MCKENSI REDINGER  Procedure(s) Performed: LAPAROSCOPIC RIGHT SALPINGECTOMY AND REMOVAL OF ECTOPIC PREGNANCY (Right ) Intrauterine Device (Iud) Removal  Patient Location: PACU  Anesthesia Type:General  Level of Consciousness: awake, alert  and oriented  Airway & Oxygen Therapy: Patient Spontanous Breathing  Post-op Assessment: Report given to RN and Post -op Vital signs reviewed and stable  Post vital signs: Reviewed and stable  Last Vitals:  Vitals Value Taken Time  BP 99/51 10/30/18 0400  Temp 36.4 C 10/30/18 0400  Pulse 97 10/30/18 0400  Resp 18 10/30/18 0400  SpO2 100 % 10/30/18 0400    Last Pain:  Vitals:   10/30/18 0400  TempSrc:   PainSc: 10-Worst pain ever      Patients Stated Pain Goal: 0 (76/19/50 9326)  Complications: No apparent anesthesia complications

## 2018-10-30 NOTE — MAU Note (Signed)
Having lower abd pain for couple days. Has in IUD. Positive upt this am. No bleeding.

## 2018-10-30 NOTE — Discharge Instructions (Signed)
Diagnostic Laparoscopy Diagnostic laparoscopy is a procedure to diagnose diseases in the abdomen. It might be done for a variety of reasons, such as to look for scar tissue, cancer, or a reason for abdomen (abdominal) pain. During the procedure, a thin, flexible tube that has a light and a camera on the end (laparoscope) is inserted through an incision in the abdomen. The image from the camera is shown on a monitor to help your surgeon see inside your body. Tell a health care provider about:  Any allergies you have.  All medicines you are taking, including vitamins, herbs, eye drops, creams, and over-the-counter medicines.  Any problems you or family members have had with anesthetic medicines.  Any blood disorders you have.  Any surgeries you have had.  Any medical conditions you have. What are the risks? Generally, this is a safe procedure. However, problems may occur, including:  Infection.  Bleeding.  Allergic reactions to medicines or dyes.  Damage to abdominal structures or organs, such as the intestines, liver, stomach, or spleen. What happens before the procedure? Medicines  Ask your health care provider about: ? Changing or stopping your regular medicines. This is especially important if you are taking diabetes medicines or blood thinners. ? Taking medicines such as aspirin and ibuprofen. These medicines can thin your blood. Do not take these medicines unless your health care provider tells you to take them. ? Taking over-the-counter medicines, vitamins, herbs, and supplements.  You may be given antibiotic medicine to help prevent infection. Staying hydrated Follow instructions from your health care provider about hydration, which may include:  Up to 2 hours before the procedure - you may continue to drink clear liquids, such as water, clear fruit juice, black coffee, and plain tea. Eating and drinking restrictions Follow instructions from your health care provider  about eating and drinking, which may include:  8 hours before the procedure - stop eating heavy meals or foods such as meat, fried foods, or fatty foods.  6 hours before the procedure - stop eating light meals or foods, such as toast or cereal.  6 hours before the procedure - stop drinking milk or drinks that contain milk.  2 hours before the procedure - stop drinking clear liquids. General instructions  Ask your health care provider how your surgical site will be marked or identified.  You may be asked to shower with a germ-killing soap.  Plan to have someone take you home from the hospital or clinic.  Plan to have a responsible adult care for you for at least 24 hours after you leave the hospital or clinic. This is important. What happens during the procedure?   To lower your risk of infection: ? Your health care team will wash or sanitize their hands. ? Hair may be removed from the surgical area. ? Your skin will be washed with soap.  An IV will be inserted into one of your veins.  You will be given a medicine to make you fall asleep (general anesthetic). You may also be given a medicine to help you relax (sedative).  A breathing tube will be placed down your throat to help you breathe during the procedure.  Your abdomen will be filled with an air-like gas so it expands. This will give the surgeon more room to operate and will make your organs easier to see.  Many small incisions will be made in your abdomen.  A laparoscope and other surgical instruments will be inserted into your abdomen through the  incisions.  A tissue sample may be removed from an organ for examination (biopsy). This will depend on the reason why you are having this procedure.  The laparoscope and other instruments will be removed from your abdomen.  The gas will be released.  Your incisions will be closed with stitches (sutures) and covered with a bandage (dressing).  Your breathing tube will be  removed. The procedure may vary among health care providers and hospitals. What happens after the procedure?   Your blood pressure, heart rate, breathing rate, and blood oxygen level will be monitored until the medicines you were given have worn off.  Do not drive for 24 hours if you were given a sedative during your procedure.  It is up to you to get the results of your procedure. Ask your health care provider, or the department that is doing the procedure, when your results will be ready. Summary  Diagnostic laparoscopy is a way to look for problems in the abdomen using small incisions.  Follow instructions from your health care provider about how to prepare for the procedure.  Plan to have a responsible adult care for you for at least 24 hours after you leave the hospital or clinic. This is important. This information is not intended to replace advice given to you by your health care provider. Make sure you discuss any questions you have with your health care provider. Document Released: 07/31/2000 Document Revised: 03/22/2017 Document Reviewed: 10/18/2016 Elsevier Interactive Patient Education  2019 [employer] Inc[employer]   Ruptured Ectopic Pregnancy  An ectopic pregnancy is when a fertilized egg attaches (implants) outside of the uterus, usually in a fallopian tube. A ruptured ectopic pregnancy is when the fallopian tube tears or bursts. This results in internal bleeding, intense abdominal pain, and sometimes, vaginal bleeding. Most ectopic pregnancies occur in the fallopian tube. In rare cases, it may occur on the ovary, intestine, pelvis, or cervix. An ectopic pregnancy does not have the ability to develop into a normal, healthy baby. A ruptured ectopic pregnancy can affect your ability to have children (fertility), depending on damage it causes to your reproductive organs. Ruptured ectopic pregnancy is a medical emergency. If not treated immediately, it can lead to blood loss, shock, or  even death. What are the causes? Most ectopic pregnancies are caused by damage to the fallopian tubes. The damage prevents the fertilized egg from implanting in the uterus. In some cases, the cause may not be known. What increases the risk? You are at increased risk for an ectopic pregnancy if: You have had a previous ectopic pregnancy. You have had previous fallopian tube surgery. You have had previous surgery to have the fallopian tubes tied (tubal ligation). You have had infertility treatments or have a history of infertility. You have been exposed to DES. DES is a medicine that was used until 1971 and had effects on babies whose mothers took the medicine. You use an IUD (intrauterine device) for birth control. You use progestin-only oral contraception for birth control. You have a history of pelvic inflammatory disease (PID). You have a history of endometriosis. You smoke. You became sexually active before 38 years of age. You have multiple sexual partners. What are the signs or symptoms? Symptoms of a ruptured ectopic pregnancy and internal bleeding may include: Sudden, severe pain in the abdomen and pelvis. Dizziness or fainting. Pain in the shoulder area. Vaginal bleeding. How is this diagnosed? This condition is diagnosed based on your medical history, symptoms, a physical exam, and tests,  which may include: A pregnancy test. An ultrasound. Measuring the levels of the pregnancy hormone in the bloodstream. Taking a sample of tissue from the uterus (dilation and curettage, D&C). Surgery to visually examine the inside of the abdomen using a lighted tube (laparoscopy). How is this treated? This condition is treated with IV fluids and emergency surgery to remove the ectopic pregnancy and repair the area where the rupture occured. If you have lost a lot of blood, you may need a blood transfusion. If you are Rh negative and your baby's father is Rh positive, or the Rh type of the  father is unknown, you may receive a Rho (D) immune globulin shot. This is to prevent Rh problems in future pregnancies. Additional medicines may be given. Get help right away if: You are taking medicines to treat an ectopic pregnancy and you develop symptoms of a rupture. These include: Fever or chills. Shoulder pain. Vaginal bleeding. Nausea and vomiting. Severe abdominal pain or cramping. Feeling light-headed or fainting. Summary An ectopic pregnancy is when a fertilized egg attaches (implants) outside of the uterus, usually in a fallopian tube. A ruptured ectopic pregnancy is when the fallopian tube tears or bursts. Ruptured ectopic pregnancy is a medical emergency. If not treated immediately, it can lead to blood loss, shock, or even death. This condition is treated with IV fluids and emergency surgery to remove the ectopic pregnancy and repair the area where the rupture occured. If you have lost a lot of blood, you may need a blood transfusion. This information is not intended to replace advice given to you by your health care provider. Make sure you discuss any questions you have with your health care provider. Document Released: 04/21/2000 Document Revised: 07/12/2016 Document Reviewed: 07/12/2016 Elsevier Interactive Patient Education  2019 [employer]

## 2018-10-30 NOTE — MA[facility] Provider Note (Addendum)
Chief Complaint: Abdominal Pain     SUBJECTIVE HPI: Shelly Andrews is a 38 y.o. [phone] at Unknown by LMP who presents to maternity admissions reporting severe pelvic pain for several days.  Had an IUD placed in 2014 and had not had a period since.  Took a pregnancy test yesterday. She denies vaginal bleeding, vaginal itching/burning, urinary symptoms, h/a, dizziness, n/v, or fever/chills.    States had diabetes with one pregnancy and has no other major health problems.  Denies having any surgeries in past   Past Medical History:  Diagnosis Date  . Depression   . No pertinent past medical history   . Pregnancy   . Pregnancy, supervision of, high-risk 10/29/2012  . Splenomegaly 04/16/2013  . Thrombocytopenia (HCC)    Idopathic with pregnancy   Past Surgical History:  Procedure Laterality Date  . NO PAST SURGERIES     Social History   Socioeconomic History  . Marital status: Single    Spouse name: Not on file  . Number of children: Not on file  . Years of education: Not on file  . Highest education level: Not on file  Occupational History  . Not on file  Social Needs  . Financial resource strain: Not on file  . Food insecurity    Worry: Not on file    Inability: Not on file  . Transportation needs    Medical: Not on file    Non-medical: Not on file  Tobacco Use  . Smoking status: Never Smoker  . Smokeless tobacco: Never Used  Substance and Sexual Activity  . Alcohol use: Yes    Comment: 1-2 on weekend  . Drug use: No  . Sexual activity: Yes  Lifestyle  . Physical activity    Days per week: Not on file    Minutes per session: Not on file  . Stress: Not on file  Relationships  . Social Musicianconnections    Talks on phone: Not on file    Gets together: Not on file    Attends religious service: Not on file    Active member of club or organization: Not on file    Attends meetings of clubs or organizations: Not on file    Relationship status: Not on file  . Intimate  partner violence    Fear of current or ex partner: Not on file    Emotionally abused: Not on file    Physically abused: Not on file    Forced sexual activity: Not on file  Other Topics Concern  . Not on file  Social History Narrative  . Not on file   No current facility-administered medications on file prior to encounter.    Current Outpatient Medications on File Prior to Encounter  Medication Sig Dispense Refill  . acetaminophen (TYLENOL) 325 MG tablet Take 650 mg by mouth every 6 (six) hours as needed for pain.    Marland Kitchen. desvenlafaxine (PRISTIQ) 50 MG 24 hr tablet Take 50 mg by mouth daily.    . ondansetron (ZOFRAN-ODT) 4 MG disintegrating tablet Take 1 tablet (4 mg total) by mouth every 8 (eight) hours as needed for nausea or vomiting. 12 tablet 0   Allergies  Allergen Reactions  . Zoloft [Sertraline Hcl] Hives  . Sulfa Antibiotics Hives    I have reviewed patient's Past Medical Hx, Surgical Hx, Family Hx, Social Hx, medications and allergies.   ROS:  Review of Systems  Constitutional: Negative for chills and fever.  Respiratory: Negative for shortness of breath.  Gastrointestinal: Positive for abdominal pain. Negative for constipation, diarrhea, nausea and vomiting.  Genitourinary: Positive for pelvic pain. Negative for vaginal bleeding and vaginal discharge.   Review of Systems  Other systems negative   Physical Exam  Physical Exam Patient Vitals for the past 24 hrs:  BP Temp Pulse Resp  10/30/18 0001 (!) 87/58 - (!) 132 18    Constitutional: Well-developed, well-nourished female in no acute distress, but has chills and is pale. Unable to obtain a temperature.  .  Cardiovascular: RRR, Tachycardia Respiratory: normal effort GI: Abd soft, very tender. Pos BS x 4    Positive guarding, Positive rebound MS: Extremities nontender, no edema, normal ROM Neurologic: Alert and oriented x 4.  GU: Neg CVAT.  PELVIC EXAM: Deferred  LAB RESULTS Labs ordered stat      IMAGING Bedside US done state.  Right adnexal ectopic, not living Free fluid in abdomen/pelvis, c/w blood  MAU Management/MDM: Ordered labs and stat US.  This bleeding/pain can represent a normal pregnancy with bleeding, spontaneous abortion or even an ectopic which can be life-threatening.  The process as listed above helps to determine which of these is present.  Blankets applied.  2 IVs ordered with NS boluses 4 units of PRBC ordered, transfuse 2units, emergent release Dr Nehemiah Settle called and came to bedside immediately Dr Elly Modena on the way in Prepare for surger  ASSESSMENT Right ectopic pregnancy Bleeding internally Shock, hypotensive Hypothermia  PLAN Admit to OR for surgery MD to follow May need blood warmer  Hansel Feinstein CNM, MSN Certified Nurse-Midwife 10/30/2018  12:15 AM

## 2018-10-30 NOTE — Plan of Care (Signed)
  Problem: Education: Goal: Knowledge of General Education information will improve Description: Including pain rating scale, medication(s)/side effects and non-pharmacologic comfort measures 10/30/2018 0558 by Lars Masson, RN Outcome: Progressing 10/30/2018 0558 by Lars Masson, RN Outcome: Progressing   Problem: Clinical Measurements: Goal: Will remain free from infection 10/30/2018 0558 by Lars Masson, RN Outcome: Progressing 10/30/2018 0558 by Lars Masson, RN Outcome: Progressing   Problem: Activity: Goal: Risk for activity intolerance will decrease 10/30/2018 0558 by Lars Masson, RN Outcome: Progressing 10/30/2018 0558 by Lars Masson, RN Outcome: Progressing   Problem: Nutrition: Goal: Adequate nutrition will be maintained Description: Will advance diet as tolerated 10/30/2018 0558 by Lars Masson, RN Outcome: Progressing 10/30/2018 0558 by Lars Masson, RN Outcome: Progressing   Problem: Elimination: Goal: Will not experience complications related to bowel motility Outcome: Progressing

## 2018-10-30 NOTE — H&P (Addendum)
Preoperative History and Physical  Shelly Andrews is a 38 y.o. [id number] here for surgical management of ruptured ectopic pregnancy. Pain started a couple of days ago and has been worsening. Pain is located in RLQ and radiates into right shoulder. She has been feeling week and nauseated. No significant preoperative concerns. She has an IUD placed in 2014.   Denies prior surgeries.  Proposed surgery: exploratory laparoscopy with unilateral salpingectomy.   Past Medical History:  Diagnosis Date  . Depression   . No pertinent past medical history   . Pregnancy   . Pregnancy, supervision of, high-risk 10/29/2012  . Splenomegaly 04/16/2013  . Thrombocytopenia (HCC)    Idopathic with pregnancy   Past Surgical History:  Procedure Laterality Date  . NO PAST SURGERIES     OB History    Gravida  5   Para  4   Term  1   Preterm  3   AB  0   Living  3     SAB  0   TAB  0   Ectopic  0   Multiple  0   Live Births  1          Patient denies any cervical dysplasia or STIs. No current facility-administered medications on file prior to encounter.    Current Outpatient Medications on File Prior to Encounter  Medication Sig Dispense Refill  . acetaminophen (TYLENOL) 325 MG tablet Take 650 mg by mouth every 6 (six) hours as needed for pain.    Marland Kitchen. desvenlafaxine (PRISTIQ) 50 MG 24 hr tablet Take 50 mg by mouth daily.    . ondansetron (ZOFRAN-ODT) 4 MG disintegrating tablet Take 1 tablet (4 mg total) by mouth every 8 (eight) hours as needed for nausea or vomiting. 12 tablet 0   Allergies  Allergen Reactions  . Zoloft [Sertraline Hcl] Hives  . Sulfa Antibiotics Hives   Social History:   reports that she has never smoked. She has never used smokeless tobacco. She reports current alcohol use. She reports that she does not use drugs.  Family History  Problem Relation Age of Onset  . Diabetes Mother   . Diabetes Maternal Grandmother     Review of Systems: Full 10 systems  review of systems preformed, which were normal other than what was stated in the HPI.  PHYSICAL EXAM: Blood pressure (!) 79/47, pulse (!) 134, resp. rate 18. General appearance - alert, well appearing, and in no distress Head - Normocephalic, atraumatic.  Right and left external ears normal. Eyes - EOMI.  Nonicteric.  Normal conjunctiva Neck - supple, no lymphadenopathy.  No tracheal deviation Chest - clear to auscultation, no wheezes, rales or rhonchi, symmetric air entry Heart - normal rate and regular rhythm Abdomen - soft, Tender in the RLQ with rebound and guarding. Mild distension. No masses or organomegaly Pelvic - examination not indicated Extremities - peripheral pulses normal, no pedal edema, no clubbing or cyanosis Skin - Warm to touch. no bruises, rashes, wounds. Neuro - Oriented x3.  Cranial nerves intact. Psych - normal thought process.  Judgement intact.  Labs: Results for orders placed or performed during the hospital encounter of 10/29/18 (from the past 336 hour(s))  CBC   Collection Time: 10/30/18 12:21 AM  Result Value Ref Range   WBC 22.1 (H) 4.0 - 10.5 K/uL   RBC 3.81 (L) 3.87 - 5.11 MIL/uL   Hemoglobin 12.0 12.0 - 15.0 g/dL   HCT 14.736.6 82.936.0 - 56.246.0 %   MCV 96.1  80.0 - 100.0 fL   MCH 31.5 26.0 - 34.0 pg   MCHC 32.8 30.0 - 36.0 g/dL   RDW 11.912.0 14.711.5 - 82.915.5 %   Platelets 287 150 - 400 K/uL   nRBC 0.0 0.0 - 0.2 %  Type and screen   Collection Time: 10/30/18 12:21 AM  Result Value Ref Range   ABO/RH(D) O NEG    Antibody Screen PENDING    Sample Expiration      11/02/2018,2359 Performed at [facility], [address] [place], [place] [address]    Unit Number [id number]    Blood Component Type RBC LR PHER1    Unit division 00    Status of Unit ISSUED    Unit tag comment EMERGENCY RELEASE    Transfusion Status OK TO TRANSFUSE    Crossmatch Result PENDING    Unit Number [id number]    Blood Component Type RBC LR PHER2    Unit division 00     Status of Unit ISSUED    Unit tag comment EMERGENCY RELEASE    Transfusion Status OK TO TRANSFUSE    Crossmatch Result PENDING    Unit Number [id number]    Blood Component Type RED CELLS,LR    Unit division 00    Status of Unit ISSUED    Unit tag comment EMERGENCY RELEASE    Transfusion Status OK TO TRANSFUSE    Crossmatch Result PENDING    Unit Number [id number]    Blood Component Type RED CELLS,LR    Unit division 00    Status of Unit ISSUED    Unit tag comment EMERGENCY RELEASE    Transfusion Status OK TO TRANSFUSE    Crossmatch Result PENDING   BPAM RBC   Collection Time: 10/30/18 12:21 AM  Result Value Ref Range   ISSUE DATE / TIME [id number]    Blood Product Unit Number [id number]    Unit Type and Rh 9500    Blood Product Expiration Date [id number]    ISSUE DATE / TIME [id number]    Blood Product Unit Number [id number]    Unit Type and Rh 9500    Blood Product Expiration Date [id number]    ISSUE DATE / TIME [id number]    Blood Product Unit Number [id number]    Unit Type and Rh 9500    Blood Product Expiration Date [id number]    ISSUE DATE / TIME [id number]    Blood Product Unit Number [id number]    Unit Type and Rh 9500    Blood Product Expiration Date [id number]     Imaging Studies: [place] Ob Comp Less 14 Wks  Result Date: 10/30/2018 CLINICAL DATA:  Hypotension with concern for ruptured ectopic. EXAM: OBSTETRIC <14 WK US AND TRANSVAGINAL OB US TECHNIQUE: Both transabdominal and transvaginal ultrasound examinations were performed for complete evaluation of the gestation as well as the maternal uterus, adnexal regions, and pelvic cul-de-sac. Transvaginal technique was performed to assess early pregnancy. COMPARISON:  None. FINDINGS: Intrauterine gestational sac: None Yolk sac:  Not Visualized. Embryo:  Visualized. Cardiac Activity: Not Visualized. Heart Rate: None CRL: 6.58 mm   6 w   3 d                  US EDC: Not applicable Subchorionic  hemorrhage:  Not applicable Maternal uterus/adnexae: There is a right adnexal ectopic pregnancy at 6 weeks and 3 days. No fetal heart tones were identified. There is a complex 10 x 3.2 x 10.4 cm mass surrounding the  right adnexa. An IUD is noted. IMPRESSION: 1. Right adnexal ectopic pregnancy at 6 weeks and 3 days without evidence of a fetal heartbeat. 2. Large complex mass adjacent to the right adnexa measuring 10 cm is consistent with hemorrhage. 3. No intrauterine pregnancy identified. 4. An IUD is noted within the uterus. These results were called by telephone at the time of interpretation on 10/30/2018 at 12:32 am to [facility] , who verbally acknowledged these results. Electronically Signed   By: Constance Holster M.D.   On: 10/30/2018 00:35    Assessment: Patient Active Problem List   Diagnosis Date Noted  . Splenomegaly 04/16/2013  . Pregnancy, supervision of, high-risk 10/29/2012  . Low vitamin B12 level 05/24/2012  . 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 H/H ok, no transfusion needed. COVID pending. Patient type and screened.  Rhogam for Rh neg.  Patient will undergo surgical management with Exploratory laparoscopy with unilateral salpingectomy. Possible laparotomy. Patient does want her IUD removed. The risks of surgery were discussed in detail with the patient including but not limited to: bleeding which may require transfusion or reoperation; infection which may require antibiotics; injury to surrounding organs which may involve bowel, bladder, ureters ; need for additional procedures including laparoscopy or laparotomy; thromboembolic phenomenon, surgical site problems and other postoperative/anesthesia complications. Likelihood of success in alleviating the patient's condition was discussed. Routine postoperative instructions will be reviewed with the patient and her family in detail after surgery.  The patient concurred  with the proposed plan, giving informed written consent for the surgery.  Patient has been NPO since 4pm. She will remain NPO for procedure.  Anesthesia and OR aware.  Preoperative prophylactic antibiotics and SCDs ordered on call to the OR.  To OR when ready.    Truett Mainland, DO  10/30/2018, 12:41 AM

## 2018-10-30 NOTE — Anesthesia Preprocedure Evaluation (Addendum)
Anesthesia Evaluation  Patient identified by MRN, date of birth, ID band Patient awake    Reviewed: Allergy & Precautions, NPO status , Patient's Chart, lab work & pertinent test results  Airway Mallampati: II  TM Distance: >3 FB Neck ROM: Full    Dental  (+) Teeth Intact, Dental Advisory Given   Pulmonary    breath sounds clear to auscultation       Cardiovascular  Rhythm:Regular Rate:Tachycardia     Neuro/Psych    GI/Hepatic   Endo/Other    Renal/GU      Musculoskeletal   Abdominal   Peds  Hematology   Anesthesia Other Findings   Reproductive/Obstetrics                            Anesthesia Physical Anesthesia Plan  ASA: IV and emergent  Anesthesia Plan: General   Post-op Pain Management:    Induction: Intravenous, Cricoid pressure planned and Rapid sequence  PONV Risk Score and Plan: Ondansetron and Dexamethasone  Airway Management Planned: Oral ETT  Additional Equipment:   Intra-op Plan:   Post-operative Plan: Extubation in OR  Informed Consent: I have reviewed the patients History and Physical, chart, labs and discussed the procedure including the risks, benefits and alternatives for the proposed anesthesia with the patient or authorized representative who has indicated his/her understanding and acceptance.       Plan Discussed with: Anesthesiologist and CRNA  Anesthesia Plan Comments:         Anesthesia Quick Evaluation

## 2018-10-30 NOTE — Op Note (Addendum)
Elsie StainJennifer M Mcgraw PROCEDURE DATE: 10/29/2018 - 10/30/2018  PREOPERATIVE DIAGNOSIS: Ruptured ectopic pregnancy POSTOPERATIVE DIAGNOSIS: Ruptured right fallopian tube ectopic pregnancy PROCEDURE: Laparoscopic right salpingectomy and removal of ectopic pregnancy and IUD removal SURGEON:  Dr. Catalina AntiguaPeggy Treshon Stannard ASSISTANT: Dr. Candelaria CelesteJacob Stinson ANESTHESIOLOGIST: Kipp BroodJoslin, David, MD Anesthesiologist: Kipp BroodJoslin, David, MD CRNA: Molli HazardGordon, Theresa M, CRNA  INDICATIONS: 38 y.o. [phone] at Unknown here for with ruptured ectopic pregnancy. On exam, she had stable vital signs, and an acute abdomen. Hgb  Lab Results  Component Value Date   HGB 12.0 10/30/2018   , blood type Conflict (See Lab Report): O NEG/O NEG Performed at [facility], [address] [place], [place] [id number] . Patient was counseled regarding need for laparoscopic salpingectomy. Risks of surgery including bleeding which may require transfusion or reoperation, infection, injury to bowel or other surrounding organs, need for additional procedures including laparotomy and other postoperative/anesthesia complications were explained to patient.  Written informed consent was obtained.  FINDINGS:  Moderate amount of hemoperitoneum estimated to be about 500 of blood and clots.  Dilated right fallopian tube containing ectopic gestation. Small normal appearing uterus, normal left fallopian tube, right ovary and left ovary.  ANESTHESIA: General INTRAVENOUS FLUIDS: .500 ml Albumin and 500 ml LR ESTIMATED BLOOD LOSS:  500 mL  URINE OUTPUT: 20 ml SPECIMENS: right fallopian tube containing ectopic gestation COMPLICATIONS: None immediate  PROCEDURE IN DETAIL:  The patient was taken to the operating room where general anesthesia was administered and was found to be adequate.  She was placed in the dorsal lithotomy position, and was prepped and draped in a sterile manner.  A Foley catheter was inserted into her bladder and attached to Franco Duley drainage. The  cervix was visualized and IUD strings were visualized, grasped with a ring Forceps and the IUD was removed without difficulty. A uterine manipulator was then advanced into the uterus .  After an adequate timeout was performed, attention was then turned to the patient's abdomen where a 10-mm skin incision was made on the umbilical fold.  The fascia was identified, tented up and incised with Mayo scissors. The fascia was tagged with 0- Vicryl. The peritoneum was identified tented up and entered sharply with Metzenbaum scissors.  10-mm trocar and sleeve were then advanced without difficulty into the abdomen where intraabdominal placement was confirmed by the laparoscope. Pneumoperitoneum was achieved with insufflation of CO2 gas. A survey of the patient's pelvis and abdomen revealed the findings as above.  Bilateral lower quadrant ports were placed under direct visualization; 5-mm on the left and 5-mm on the right.  The 5-mm Nezhat suction irrigator was then used to suction the hemoperitoneum and irrigate the pelvis.  Attention was then turned to the right fallopian tube which was grasped and ligated from the underlying mesosalpinx and uterine attachment using the Enseal instrument.  Good hemostasis was noted.  The specimen was placed in an EndoCatch bag and removed from the abdomen intact.  The abdomen was desufflated, and all instruments were removed.  The fascial incisions of both 10-mm sites were reapproximated with 0 Vicryl figure-of-eight stiches; and all skin incisions were closed with a 3-0 Vicryl subcuticular stitch. The patient tolerated the procedures well.  All instruments, needles, and sponge counts were correct x 2. The patient was taken to the recovery room in stable condition.   An experienced assistant was required given the standard of surgical care given the complexity of the case.  This assistant was needed for exposure, dissection, suctioning, retraction, instrument  exchange, assisting with  delivery with administration of fundal pressure, and for overall help during the procedure.  The patient will be discharged to home as per PACU criteria after receiving rhogam.  Routine postoperative instructions given.  She was prescribed Percocet, Ibuprofen and Colace.  She will follow up in the clinic in 2 weeks for postoperative evaluation .

## 2018-10-30 NOTE — MAU Note (Signed)
Covid test obtained without difficulty and pt tol well. No symptoms 

## 2018-10-30 NOTE — Anesthesia Postprocedure Evaluation (Signed)
Anesthesia Post Note  Patient: Shelly Andrews  Procedure(s) Performed: LAPAROSCOPIC RIGHT SALPINGECTOMY AND REMOVAL OF ECTOPIC PREGNANCY (Right ) Intrauterine Device (Iud) Removal     Patient location during evaluation: PACU Anesthesia Type: General Level of consciousness: awake and alert Pain management: pain level controlled Vital Signs Assessment: post-procedure vital signs reviewed and stable Respiratory status: spontaneous breathing, nonlabored ventilation, respiratory function stable and patient connected to nasal cannula oxygen Cardiovascular status: blood pressure returned to baseline and stable Postop Assessment: no apparent nausea or vomiting Anesthetic complications: no    Last Vitals:  Vitals:   10/30/18 0415 10/30/18 0430  BP: (!) 93/51 (!) 93/53  Pulse: 85 79  Resp: (!) 9 (!) 8  Temp:    SpO2: 100% 93%    Last Pain:  Vitals:   10/30/18 0441  TempSrc:   PainSc: 7                  Dolton Shaker COKER

## 2018-10-30 NOTE — Anesthesia Procedure Notes (Signed)
Procedure Name: Intubation Date/Time: 10/30/2018 2:53 AM Performed by: Valetta Fuller, CRNA Pre-anesthesia Checklist: Patient identified, Emergency Drugs available, Suction available and Patient being monitored Patient Re-evaluated:Patient Re-evaluated prior to induction Oxygen Delivery Method: Circle system utilized Preoxygenation: Pre-oxygenation with 100% oxygen Induction Type: IV induction, Rapid sequence and Cricoid Pressure applied Laryngoscope Size: Miller and 2 Grade View: Grade I Tube type: Oral Tube size: 7.5 mm Number of attempts: 1 Airway Equipment and Method: Stylet Placement Confirmation: ETT inserted through vocal cords under direct vision,  positive ETCO2 and breath sounds checked- equal and bilateral Secured at: 21 cm Tube secured with: Tape Dental Injury: Teeth and Oropharynx as per pre-operative assessment

## 2018-10-30 NOTE — Plan of Care (Signed)
  Problem: Education: Goal: Knowledge of General Education information will improve Description: Including pain rating scale, medication(s)/side effects and non-pharmacologic comfort measures Outcome: Progressing   Problem: Pain Managment: Goal: General experience of comfort will improve Note: Pt describing gas pain 10/10 in right shoulder. MD notified of continued pain. New orders.    Problem: Skin Integrity: Goal: Risk for impaired skin integrity will decrease Note: Lap sites clean and dry with no signs of infection. Continuing to monitor healing.

## 2018-10-31 ENCOUNTER — Telehealth: Payer: Self-pay | Admitting: Obstetrics & Gynecology

## 2018-10-31 ENCOUNTER — Encounter (HOSPITAL_COMMUNITY): Payer: Self-pay | Admitting: Obstetrics and Gynecology

## 2018-10-31 LAB — RH IG WORKUP (INCLUDES ABO/RH)
ABO/RH(D): O NEG
Gestational Age(Wks): 5
Unit division: 0

## 2018-10-31 NOTE — Telephone Encounter (Signed)
Left a message on her voicemail to call the office about an appointment.

## 2018-11-14 ENCOUNTER — Telehealth: Payer: Self-pay | Admitting: Family Medicine

## 2018-11-14 ENCOUNTER — Encounter: Payer: Self-pay | Admitting: Obstetrics & Gynecology

## 2018-11-14 ENCOUNTER — Telehealth (INDEPENDENT_AMBULATORY_CARE_PROVIDER_SITE_OTHER): Payer: Self-pay | Admitting: Obstetrics & Gynecology

## 2018-11-14 ENCOUNTER — Other Ambulatory Visit: Payer: Self-pay

## 2018-11-14 DIAGNOSIS — Z9889 Other specified postprocedural states: Secondary | ICD-10-CM

## 2018-11-14 NOTE — Progress Notes (Signed)
I connected with  Shelly Andrews on 11/14/18 at  1:35 PM EDT by telephone and verified that I am speaking with the correct person using two identifiers.   I discussed the limitations, risks, security and privacy concerns of performing an evaluation and management service by telephone and the availability of in person appointments. I also discussed with the patient that there may be a patient responsible charge related to this service. The patient expressed understanding and agreed to proceed.  [place], CMA 11/14/2018  1:39 PM   Pains on right side not too bad and has to put pressure on it for it to stop.

## 2018-11-14 NOTE — Progress Notes (Signed)
TELEHEALTH GYNECOLOGY VIRTUAL VIDEO VISIT ENCOUNTER NOTE  Provider location: [facility]   I connected with Shelly StainJennifer M Morici on 11/14/18 at  1:35 PM EDT by MyChart Video Encounter at home and verified that I am speaking with the correct person using two identifiers.   I discussed the limitations, risks, security and privacy concerns of performing an evaluation and management service virtually and the availability of in person appointments. I also discussed with the patient that there may be a patient responsible charge related to this service. The patient expressed understanding and agreed to proceed.   History:  Shelly StainJennifer M Andrews is a 38 y.o. [phone] female being evaluated today for post op check. She is s/p right salpingectomy due to ectopic pregnancy on 10/30/2018. Pt has her IUD removed at the same time. She reports that she is feeling well. She denies any abnormal vaginal discharge, bleeding, pelvic pain or other concerns.  Pt does report that she has pain still on the right side. She is not taking any meds for the pain.   Pt reports that she is a patient of [facility]. She reports that she did not tell anyone at the hospital who her doctors were because she was in shock and she went emergently for surgery.       Past Medical History:  Diagnosis Date   Depression    No pertinent past medical history    Pregnancy    Pregnancy, supervision of, high-risk 10/29/2012   Splenomegaly 04/16/2013   Thrombocytopenia (HCC)    Idopathic with pregnancy   Past Surgical History:  Procedure Laterality Date   IUD REMOVAL  10/30/2018   Procedure: Intrauterine Device (Iud) Removal;  Surgeon: Catalina Antiguaonstant, Peggy, MD;  Location: [facility] OR;  Service: Gynecology;;   LAPAROSCOPIC UNILATERAL SALPINGO OOPHERECTOMY Right 10/30/2018   Procedure: LAPAROSCOPIC RIGHT SALPINGECTOMY AND REMOVAL OF ECTOPIC PREGNANCY;  Surgeon: Catalina Antiguaonstant, Peggy, MD;  Location: [facility] OR;   Service: Gynecology;  Laterality: Right;   NO PAST SURGERIES     The following portions of the patient's history were reviewed and updated as appropriate: allergies, current medications, past family history, past medical history, past social history, past surgical history and problem list.    Review of Systems:  Pertinent items noted in HPI and remainder of comprehensive ROS otherwise negative.  Physical Exam:   General:  Alert, oriented and cooperative. Patient appears to be in no acute distress.  Mental Status: Normal mood and affect. Normal behavior. Normal judgment and thought content.   Respiratory: Normal respiratory effort, no problems with respiration noted  Rest of physical exam deferred due to type of encounter  Labs and Imaging No results found for this or any previous visit (from the past 336 hour(s)). [place] Ob Comp Less 14 Wks  Result Date: 10/30/2018 CLINICAL DATA:  Hypotension with concern for ruptured ectopic. EXAM: OBSTETRIC <14 WK US AND TRANSVAGINAL OB US TECHNIQUE: Both transabdominal and transvaginal ultrasound examinations were performed for complete evaluation of the gestation as well as the maternal uterus, adnexal regions, and pelvic cul-de-sac. Transvaginal technique was performed to assess early pregnancy. COMPARISON:  None. FINDINGS: Intrauterine gestational sac: None Yolk sac:  Not Visualized. Embryo:  Visualized. Cardiac Activity: Not Visualized. Heart Rate: None CRL: 6.58 mm   6 w   3 d                  US EDC: Not applicable Subchorionic hemorrhage:  Not applicable Maternal uterus/adnexae: There  is a right adnexal ectopic pregnancy at 6 weeks and 3 days. No fetal heart tones were identified. There is a complex 10 x 3.2 x 10.4 cm mass surrounding the right adnexa. An IUD is noted. IMPRESSION: 1. Right adnexal ectopic pregnancy at 6 weeks and 3 days without evidence of a fetal heartbeat. 2. Large complex mass adjacent to the right adnexa measuring 10 cm is consistent  with hemorrhage. 3. No intrauterine pregnancy identified. 4. An IUD is noted within the uterus. These results were called by telephone at the time of interpretation on 10/30/2018 at 12:32 am to [facility] , who verbally acknowledged these results. Electronically Signed   By: Constance Holster M.D.   On: 10/30/2018 00:35       Assessment and Plan:     2 weeks post op check.  Doing well  Will send [facility]- Dr. Lynnette Caffey.     Pt to follow up at [facility] for her follow up care.      I discussed the assessment and treatment plan with the patient. The patient was provided an opportunity to ask questions and all were answered. The patient agreed with the plan and demonstrated an understanding of the instructions.   The patient was advised to call back or seek an in-person evaluation/go to the ED if the symptoms worsen or if the condition fails to improve as anticipated.  I provided 16 minutes of face-to-face time during this encounter.   Lavonia Drafts, MD Center for [employer] [facility], [place]

## 2018-11-14 NOTE — Telephone Encounter (Signed)
Left a voicemail message instructing the patient of how enter the virtual visit via mychart. Informed the patient of logging in 15 minutes prior to the appointment and if she has any questions please call our office at [phone]. °

## 2019-05-09 NOTE — L&D Delivery Note (Signed)
Delivery Note  SVD viable female "[place]:"  Apgars 9,9 over intact perineum.  Placenta delivered spontaneously intact with 3VC. Good support and hemostasis noted..  PH art was sent.   Mother and baby to couplet care and are doing well.  EBL 200cc  Candice Camp, MD

## 2019-05-11 ENCOUNTER — Inpatient Hospital Stay (HOSPITAL_COMMUNITY)
Admission: AD | Admit: 2019-05-11 | Discharge: 2019-05-11 | Disposition: A | Discharge status: Home / Self Care | Payer: Medicaid Other | Attending: Obstetrics & Gynecology | Admitting: Obstetrics & Gynecology

## 2019-05-11 ENCOUNTER — Encounter (HOSPITAL_COMMUNITY): Payer: Self-pay | Admitting: Obstetrics & Gynecology

## 2019-05-11 ENCOUNTER — Other Ambulatory Visit: Payer: Self-pay

## 2019-05-11 DIAGNOSIS — N926 Irregular menstruation, unspecified: Secondary | ICD-10-CM | POA: Diagnosis not present

## 2019-05-11 DIAGNOSIS — Z8759 Personal history of other complications of pregnancy, childbirth and the puerperium: Secondary | ICD-10-CM | POA: Insufficient documentation

## 2019-05-11 NOTE — MAU Note (Addendum)
Shelly Andrews is a 39 y.o.  here in MAU reporting: took a pregnancy test yesterday and it was positive. Took the tests because she was feeling fatigued and her breasts have been tender. Wants to make sure everything is okay since last pregnancy was an ectopic. Denies pain, bleeding, and discharge.  LMP: 04/03/19  Onset of complaint: ongoing  Pain score: 0/10  Vitals:   05/11/19 1218  BP: 108/60  Pulse: (!) 106  Resp: 16  Temp: 98.6 F (37 C)  SpO2: 100%     Lab orders placed from triage: none

## 2019-05-11 NOTE — MAU Provider Note (Signed)
  S [name] is a 39 y.o. [phone] non-pregnant female who presents to MAU today with complaint of needs confirmation of pregnancy.  History of ectopic pregnancy. States she had a positive test at home. She has no pain or vaginal bleeding. [facility] seeing [facility] however now has [employer] and is unsure if they will see her.   O BP 108/60 (BP Location: Right Arm)   Pulse (!) 106   Temp 98.6 F (37 C) (Oral)   Resp 16   Ht 5\' 3"  (1.6 m)   Wt 60.7 kg   LMP 04/03/2019   SpO2 100%   Breastfeeding Unknown   BMI 23.72 kg/m  Physical Exam  Constitutional: She is oriented to person, place, and time. She appears well-developed and well-nourished. No distress.  Neurological: She is alert and oriented to person, place, and time.  Skin: She is not diaphoretic.  Psychiatric: Her behavior is normal.    A  Missed menses No abdominal pain or vaginal bleeding at this time.  Medical screening exam complete   P Discharge from MAU in stable condition Appt made for her to go to [facility] tomorrow at 0900 for Hcg and urine pregnancy test; for pregnancy confirmation 04/05/2019 scheduled, if urine pregnancy test is negative tomorrow I will cancel the [place] order Warning signs for worsening condition that would warrant emergency follow-up discussed; Return to MAU with any pain or vaginal bleeding.  Patient may return to MAU as needed for pregnancy related complaints    Shelly Andrews, [place], NP 05/11/2019 12:56 PM

## 2019-05-12 ENCOUNTER — Ambulatory Visit (INDEPENDENT_AMBULATORY_CARE_PROVIDER_SITE_OTHER): Payer: Medicaid Other | Admitting: Lactation Services

## 2019-05-12 DIAGNOSIS — R102 Pelvic and perineal pain: Secondary | ICD-10-CM

## 2019-05-12 DIAGNOSIS — O26891 Other specified pregnancy related conditions, first trimester: Secondary | ICD-10-CM

## 2019-05-12 NOTE — Progress Notes (Signed)
Pt reports she has had 2 positive pregnancy tests. She reports she has previously been seen at [facility]. She has not called them yet. Enc pt to call [facility] to discuss if they will continue with her care.   Pt with concerns that she has had a stillborn and an ectopic pregnancy with removal of tube. She would like to have an [place] to confirm. She has not had any cramping or bleeding.   LMP 04/03/2019 EDD 01/08/2020   Discussed with pt that her beta will be back tomorrow. Scheduled [place] for 1/13 at 1 pm. Arrives at 12:45 with full bladder. Pt informed. Pt to call radiology to cancel if she is seen by [facility]. Pt voiced understanding.

## 2019-05-13 LAB — BETA HCG QUANT (REF LAB): hCG Quant: 53997 m[IU]/mL

## 2019-05-14 ENCOUNTER — Telehealth: Payer: Self-pay

## 2019-05-14 NOTE — Telephone Encounter (Signed)
Pt called and stated that she saw the result in MyChart but did not know how far along she is.  Called pt and I informed pt that her [place] appt will be able to determine what her due date is.  Pt verbalized understanding.    Addison Naegeli, RN 05/14/19

## 2019-05-14 NOTE — Telephone Encounter (Signed)
Pt called requesting results of hcg levels.   LM on VM stating that I am returning her call if she continues to have questions or concerns to please give the office and she get her results via MyChart.  I LM reminding pt of [place] scheduled on 05/21/19 @ 1300.    Addison Naegeli, RN 05/14/19

## 2019-05-16 ENCOUNTER — Encounter (HOSPITAL_COMMUNITY): Payer: Self-pay | Admitting: *Deleted

## 2019-05-16 ENCOUNTER — Inpatient Hospital Stay (HOSPITAL_COMMUNITY)
Admission: AD | Admit: 2019-05-16 | Discharge: 2019-05-16 | Disposition: A | Discharge status: Home / Self Care | Payer: Medicaid Other | Attending: Family Medicine | Admitting: Family Medicine

## 2019-05-16 ENCOUNTER — Inpatient Hospital Stay (HOSPITAL_COMMUNITY): Payer: Medicaid Other

## 2019-05-16 DIAGNOSIS — O99111 Other diseases of the blood and blood-forming organs and certain disorders involving the immune mechanism complicating pregnancy, first trimester: Secondary | ICD-10-CM | POA: Diagnosis not present

## 2019-05-16 DIAGNOSIS — O26899 Other specified pregnancy related conditions, unspecified trimester: Secondary | ICD-10-CM

## 2019-05-16 DIAGNOSIS — D696 Thrombocytopenia, unspecified: Secondary | ICD-10-CM | POA: Insufficient documentation

## 2019-05-16 DIAGNOSIS — O360910 Maternal care for other rhesus isoimmunization, first trimester, not applicable or unspecified: Secondary | ICD-10-CM

## 2019-05-16 DIAGNOSIS — O208 Other hemorrhage in early pregnancy: Secondary | ICD-10-CM | POA: Insufficient documentation

## 2019-05-16 DIAGNOSIS — Z3A01 Less than 8 weeks gestation of pregnancy: Secondary | ICD-10-CM | POA: Diagnosis not present

## 2019-05-16 DIAGNOSIS — Z79899 Other long term (current) drug therapy: Secondary | ICD-10-CM | POA: Insufficient documentation

## 2019-05-16 DIAGNOSIS — R109 Unspecified abdominal pain: Secondary | ICD-10-CM | POA: Diagnosis not present

## 2019-05-16 DIAGNOSIS — O418X10 Other specified disorders of amniotic fluid and membranes, first trimester, not applicable or unspecified: Secondary | ICD-10-CM | POA: Diagnosis not present

## 2019-05-16 DIAGNOSIS — O468X1 Other antepartum hemorrhage, first trimester: Secondary | ICD-10-CM

## 2019-05-16 DIAGNOSIS — O99119 Other diseases of the blood and blood-forming organs and certain disorders involving the immune mechanism complicating pregnancy, unspecified trimester: Secondary | ICD-10-CM

## 2019-05-16 DIAGNOSIS — O26891 Other specified pregnancy related conditions, first trimester: Secondary | ICD-10-CM

## 2019-05-16 DIAGNOSIS — Z6791 Unspecified blood type, Rh negative: Secondary | ICD-10-CM

## 2019-05-16 LAB — CBC
HCT: 39.4 % (ref 36.0–46.0)
Hemoglobin: 13.3 g/dL (ref 12.0–15.0)
MCH: 31.7 pg (ref 26.0–34.0)
MCHC: 33.8 g/dL (ref 30.0–36.0)
MCV: 93.8 fL (ref 80.0–100.0)
Platelets: 128 10*3/uL — ABNORMAL LOW (ref 150–400)
RBC: 4.2 MIL/uL (ref 3.87–5.11)
RDW: 11.9 % (ref 11.5–15.5)
WBC: 5.9 10*3/uL (ref 4.0–10.5)
nRBC: 0 % (ref 0.0–0.2)

## 2019-05-16 LAB — URINALYSIS, ROUTINE W REFLEX MICROSCOPIC
Bilirubin Urine: NEGATIVE
Glucose, UA: NEGATIVE mg/dL
Ketones, ur: NEGATIVE mg/dL
Nitrite: NEGATIVE
Protein, ur: NEGATIVE mg/dL
Specific Gravity, Urine: 1.006 (ref 1.005–1.030)
pH: 6 (ref 5.0–8.0)

## 2019-05-16 LAB — HCG, QUANTITATIVE, PREGNANCY: hCG, Beta Chain, Quant, S: 123813 m[IU]/mL — ABNORMAL HIGH (ref <5)

## 2019-05-16 LAB — WET PREP, GENITAL
Sperm: NONE SEEN
Trich, Wet Prep: NONE SEEN
Yeast Wet Prep HPF POC: NONE SEEN

## 2019-05-16 NOTE — MAU Provider Note (Signed)
History     CSN: [id number]  Arrival date and time: 05/16/19 1245   First Provider Initiated Contact with Patient 05/16/19 1318      Chief Complaint  Patient presents with  . Abdominal Pain   39 y.o. [id number] @[redacted]w[redacted]d  by sure LMP presenting with bilateral LAP. Pain started last night. Describes as cramping and intermittent. Rates 3/10. Has not taken anything for it. Concerned because she has hx of ectopic last year. Denies urinary sx. No VB or discharge. No fevers. No GI sx.   OB History    Gravida  6   Para  4   Term  1   Preterm  3   AB  1   Living  3     SAB  0   TAB  0   Ectopic  1   Multiple  0   Live Births  3           Past Medical History:  Diagnosis Date  . Depression   . No pertinent past medical history   . Pregnancy   . Pregnancy, supervision of, high-risk 10/29/2012  . Splenomegaly 04/16/2013  . Thrombocytopenia ([place])    Idopathic with pregnancy    Past Surgical History:  Procedure Laterality Date  . IUD REMOVAL  10/30/2018   Procedure: Intrauterine Device (Iud) Removal;  Surgeon: Mora Bellman, MD;  Location: [place];  Service: Gynecology;;  . LAPAROSCOPIC UNILATERAL SALPINGO OOPHERECTOMY Right 10/30/2018   Procedure: LAPAROSCOPIC RIGHT SALPINGECTOMY AND REMOVAL OF ECTOPIC PREGNANCY;  Surgeon: Mora Bellman, MD;  Location: [place];  Service: Gynecology;  Laterality: Right;  . NO PAST SURGERIES      Family History  Problem Relation Age of Onset  . Diabetes Mother   . Diabetes Maternal Grandmother     Social History   Tobacco Use  . Smoking status: Never Smoker  . Smokeless tobacco: Never Used  Substance Use Topics  . Alcohol use: Not Currently    Comment: 1-2 on weekend  . Drug use: No    Allergies:  Allergies  Allergen Reactions  . Zoloft [Sertraline Hcl] Hives  . Sulfa Antibiotics Hives    No medications prior to admission.    Review of Systems  Constitutional: Negative for chills and fever.  Gastrointestinal:  Positive for abdominal pain. Negative for constipation, diarrhea, nausea and vomiting.  Genitourinary: Negative for dysuria, hematuria, urgency, vaginal bleeding and vaginal discharge.   Physical Exam   Blood pressure 104/67, pulse 79, resp. rate 16, weight 60.3 kg, last menstrual period 04/03/2019, SpO2 100 %, unknown if currently breastfeeding.  Physical Exam  Nursing note and vitals reviewed. Constitutional: She is oriented to person, place, and time. She appears well-developed and well-nourished. No distress.  HENT:  Head: Normocephalic and atraumatic.  Cardiovascular: Normal rate.  Respiratory: Effort normal. No respiratory distress.  GI: Soft. She exhibits no distension and no mass. There is no abdominal tenderness. There is no rebound and no guarding.  Genitourinary:    Genitourinary Comments: External: no lesions or erythema Vagina: rugated, pink, moist, thin white discharge Uterus: non enlarged, anteverted, non tender, no CMT Adnexae: no masses, no tenderness left, no tenderness right Cervix closed/long    Musculoskeletal:        General: Normal range of motion.     Cervical back: Normal range of motion.  Neurological: She is alert and oriented to person, place, and time.  Skin: Skin is warm and dry.  Psychiatric: She has a normal mood and affect.  Results for orders placed or performed during the hospital encounter of 05/16/19 (from the past 24 hour(s))  Urinalysis, Routine w reflex microscopic     Status: Abnormal   Collection Time: 05/16/19  1:22 PM  Result Value Ref Range   Color, Urine YELLOW YELLOW   APPearance HAZY (A) CLEAR   Specific Gravity, Urine 1.006 1.005 - 1.030   pH 6.0 5.0 - 8.0   Glucose, UA NEGATIVE NEGATIVE mg/dL   Hgb urine dipstick LARGE (A) NEGATIVE   Bilirubin Urine NEGATIVE NEGATIVE   Ketones, ur NEGATIVE NEGATIVE mg/dL   Protein, ur NEGATIVE NEGATIVE mg/dL   Nitrite NEGATIVE NEGATIVE   Leukocytes,Ua LARGE (A) NEGATIVE   RBC / HPF 0-5 0 -  5 RBC/hpf   WBC, UA 6-10 0 - 5 WBC/hpf   Bacteria, UA RARE (A) NONE SEEN   Squamous Epithelial / LPF 0-5 0 - 5  Wet prep, genital     Status: Abnormal   Collection Time: 05/16/19  1:28 PM  Result Value Ref Range   Yeast Wet Prep HPF POC NONE SEEN NONE SEEN   Trich, Wet Prep NONE SEEN NONE SEEN   Clue Cells Wet Prep HPF POC PRESENT (A) NONE SEEN   WBC, Wet Prep HPF POC FEW (A) NONE SEEN   Sperm NONE SEEN   CBC     Status: Abnormal   Collection Time: 05/16/19  1:30 PM  Result Value Ref Range   WBC 5.9 4.0 - 10.5 K/uL   RBC 4.20 3.87 - 5.11 MIL/uL   Hemoglobin 13.3 12.0 - 15.0 g/dL   HCT 76.1 95.0 - 93.2 %   MCV 93.8 80.0 - 100.0 fL   MCH 31.7 26.0 - 34.0 pg   MCHC 33.8 30.0 - 36.0 g/dL   RDW 67.1 24.5 - 80.9 %   Platelets 128 (L) 150 - 400 K/uL   nRBC 0.0 0.0 - 0.2 %  hCG, quantitative, pregnancy     Status: Abnormal   Collection Time: 05/16/19  1:30 PM  Result Value Ref Range   hCG, Beta Chain, Quant, S 123,813 (H) <5 mIU/mL    US OB LESS THAN 14 WEEKS WITH OB TRANSVAGINAL  Result Date: 05/16/2019 CLINICAL DATA:  Abdominal pain, positive beta HCG. History of ectopic pregnancy EXAM: OBSTETRIC <14 WK [place] AND TRANSVAGINAL OB US TECHNIQUE: Both transabdominal and transvaginal ultrasound examinations were performed for complete evaluation of the gestation as well as the maternal uterus, adnexal regions, and pelvic cul-de-sac. Transvaginal technique was performed to assess early pregnancy. COMPARISON:  10/30/2018 FINDINGS: Intrauterine gestational sac: Single Yolk sac:  Visualized. Embryo:  Visualized. Cardiac Activity: Visualized. Heart Rate: 121 bpm CRL:  8.1 mm   6 w   5 d                  [place] EDC: 01/04/2020 Subchorionic hemorrhage: Small to moderate-sized measuring up to 1.8 cm along the anterior margin of the gestational sac. Maternal uterus/adnexae: Small hyperechoic, shadowing focus at the level of the cervix. Unremarkable periods of the right ovary. Probable left ovarian corpus luteal  cyst. No free fluid within the pelvis. IMPRESSION: 1. Single live intrauterine gestation measuring 6 weeks 5 days by crown-rump length. 2. Embryonic heart tones detected at 121 beats per minute. 3. Small to moderate-sized subchorionic hemorrhage. Attention on follow-up. 4. Small hyperechoic, shadowing focus at the level of the cervix, possibly representing a calcification. A residual component from an IUD is also possibility. Correlate with history of prior IUD removal  if there is any concern for a retained component. Electronically Signed   By: Duanne Guess D.O.   On: 05/16/2019 15:02   MAU Course  Procedures  MDM Labs and [place] ordered and reviewed. Normal IUP on [place] w/SCH. Pain likely physiologic to early pregnancy. Discussed findings with pt. Thrombocytopenia noted today and pt has hx during pregnancy only, recommend outpt Hematology referral. Stable for discharge home.   Assessment and Plan   1. [redacted] weeks gestation of pregnancy   2. Abdominal pain affecting pregnancy   3. Subchorionic hematoma in first trimester, single or unspecified fetus   4. Rh negative status during pregnancy in first trimester   5. Thrombocytopenia affecting pregnancy (HCC)    Discharge home Follow up at [facility] to start care SAB precautions  Allergies as of 05/16/2019      Reactions   Zoloft [sertraline Hcl] Hives   Sulfa Antibiotics Hives      Medication List    STOP taking these medications   desvenlafaxine 50 MG 24 hr tablet Commonly known as: PRISTIQ   docusate sodium 100 MG capsule Commonly known as: COLACE   ibuprofen 600 MG tablet Commonly known as: ADVIL   ondansetron 4 MG disintegrating tablet Commonly known as: ZOFRAN-ODT   oxyCODONE-acetaminophen 5-325 MG tablet Commonly known as: PERCOCET/ROXICET      Donette Larry, CNM 05/16/2019, 4:56 PM

## 2019-05-16 NOTE — Discharge Instructions (Signed)
Subchorionic Hematoma  A subchorionic hematoma is a gathering of blood between the outer wall of the embryo (chorion) and the inner wall of the womb (uterus). This condition can cause vaginal bleeding. If they cause little or no vaginal bleeding, early small hematomas usually shrink on their own and do not affect your baby or pregnancy. When bleeding starts later in pregnancy, or if the hematoma is larger or occurs in older pregnant women, the condition may be more serious. Larger hematomas may get bigger, which increases the chances of miscarriage. This condition also increases the risk of:  Premature separation of the placenta from the uterus.  Premature (preterm) labor.  Stillbirth. What are the causes? The exact cause of this condition is not known. It occurs when blood is trapped between the placenta and the uterine wall because the placenta has separated from the original site of implantation. What increases the risk? You are more likely to develop this condition if:  You were treated with fertility medicines.  You conceived through in vitro fertilization (IVF). What are the signs or symptoms? Symptoms of this condition include:  Vaginal spotting or bleeding.  Contractions of the uterus. These cause abdominal pain. Sometimes you may have no symptoms and the bleeding may only be seen when ultrasound images are taken (transvaginal ultrasound). How is this diagnosed? This condition is diagnosed based on a physical exam. This includes a pelvic exam. You may also have other tests, including:  Blood tests.  Urine tests.  Ultrasound of the abdomen. How is this treated? Treatment for this condition can vary. Treatment may include:  Watchful waiting. You will be monitored closely for any changes in bleeding. During this stage: ? The hematoma may be reabsorbed by the body. ? The hematoma may separate the fluid-filled space containing the embryo (gestational sac) from the wall of the  womb (endometrium).  Medicines.  Activity restriction. This may be needed until the bleeding stops. Follow these instructions at home:  Stay on bed rest if told to do so by your health care provider.  Do not lift anything that is heavier than 10 lbs. (4.5 kg) or as told by your health care provider.  Do not use any products that contain nicotine or tobacco, such as cigarettes and e-cigarettes. If you need help quitting, ask your health care provider.  Track and write down the number of pads you use each day and how soaked (saturated) they are.  Do not use tampons.  Keep all follow-up visits as told by your health care provider. This is important. Your health care provider may ask you to have follow-up blood tests or ultrasound tests or both. Contact a health care provider if:  You have any vaginal bleeding.  You have a fever. Get help right away if:  You have severe cramps in your stomach, back, abdomen, or pelvis.  You pass large clots or tissue. Save any tissue for your health care provider to look at.  You have more vaginal bleeding, and you faint or become lightheaded or weak. Summary  A subchorionic hematoma is a gathering of blood between the outer wall of the placenta and the uterus.  This condition can cause vaginal bleeding.  Sometimes you may have no symptoms and the bleeding may only be seen when ultrasound images are taken.  Treatment may include watchful waiting, medicines, or activity restriction. This information is not intended to replace advice given to you by your health care provider. Make sure you discuss any questions you   have with your health care provider. Document Revised: 04/06/2017 Document Reviewed: 06/20/2016 Elsevier Patient Education  2020 [employer]   Abdominal Pain During Pregnancy  Belly (abdominal) pain is common during pregnancy. There are many possible causes. Most of the time, it is not a serious problem. Other times, it can be a  sign that something is wrong with the pregnancy. Always tell your doctor if you have belly pain. Follow these instructions at home:  Do not have sex or put anything in your vagina until your pain goes away completely.  Get plenty of rest until your pain gets better.  Drink enough fluid to keep your pee (urine) pale yellow.  Take over-the-counter and prescription medicines only as told by your doctor.  Keep all follow-up visits as told by your doctor. This is important. Contact a doctor if:  Your pain continues or gets worse after resting.  You have lower belly pain that: ? Comes and goes at regular times. ? Spreads to your back. ? Feels like menstrual cramps.  You have pain or burning when you pee (urinate). Get help right away if:  You have a fever or chills.  You have vaginal bleeding.  You are leaking fluid from your vagina.  You are passing tissue from your vagina.  You throw up (vomit) for more than 24 hours.  You have watery poop (diarrhea) for more than 24 hours.  Your baby is moving less than usual.  You feel very weak or faint.  You have shortness of breath.  You have very bad pain in your upper belly. Summary  Belly (abdominal) pain is common during pregnancy. There are many possible causes.  If you have belly pain during pregnancy, tell your doctor right away.  Keep all follow-up visits as told by your doctor. This is important. This information is not intended to replace advice given to you by your health care provider. Make sure you discuss any questions you have with your health care provider. Document Revised: 08/12/2018 Document Reviewed: 07/27/2016 Elsevier Patient Education  2020 [employer].

## 2019-05-16 NOTE — MAU Note (Signed)
Pt states she started having right and left sided abdominal pain that started last night that has been intermittent into today which is what made her decide to be seen today. Denies VB. Was seen in the clinic on Monday for a HCG level.

## 2019-05-19 LAB — GC/CHLAMYDIA PROBE AMP (~~LOC~~) NOT AT ARMC
Chlamydia: NEGATIVE
Comment: NEGATIVE
Comment: NORMAL
Neisseria Gonorrhea: NEGATIVE

## 2019-05-21 ENCOUNTER — Ambulatory Visit: Payer: Medicaid Other

## 2019-05-21 ENCOUNTER — Ambulatory Visit (HOSPITAL_COMMUNITY): Admission: RE | Admit: 2019-05-21 | Payer: Medicaid Other | Source: Ambulatory Visit

## 2019-05-29 DIAGNOSIS — N926 Irregular menstruation, unspecified: Secondary | ICD-10-CM | POA: Diagnosis not present

## 2019-06-03 DIAGNOSIS — Z23 Encounter for immunization: Secondary | ICD-10-CM | POA: Diagnosis not present

## 2019-06-03 DIAGNOSIS — Z3685 Encounter for antenatal screening for Streptococcus B: Secondary | ICD-10-CM | POA: Diagnosis not present

## 2019-06-03 DIAGNOSIS — Z3481 Encounter for supervision of other normal pregnancy, first trimester: Secondary | ICD-10-CM | POA: Diagnosis not present

## 2019-06-06 NOTE — Progress Notes (Signed)
Patient ID: Shelly Andrews, female   DOB: 1980-12-30, 39 y.o.   MRN: [id number] Patient seen and assessed by nursing staff during this encounter. I have reviewed the chart and agree with the documentation and plan.  Scheryl Darter, MD 06/06/2019 11:51 AM

## 2019-06-12 DIAGNOSIS — Z124 Encounter for screening for malignant neoplasm of cervix: Secondary | ICD-10-CM | POA: Diagnosis not present

## 2019-06-12 DIAGNOSIS — Z331 Pregnant state, incidental: Secondary | ICD-10-CM | POA: Diagnosis not present

## 2019-06-12 DIAGNOSIS — Z113 Encounter for screening for infections with a predominantly sexual mode of transmission: Secondary | ICD-10-CM | POA: Diagnosis not present

## 2019-06-12 DIAGNOSIS — Z3481 Encounter for supervision of other normal pregnancy, first trimester: Secondary | ICD-10-CM | POA: Diagnosis not present

## 2019-06-17 ENCOUNTER — Telehealth: Payer: Self-pay | Admitting: Oncology

## 2019-06-17 ENCOUNTER — Telehealth: Payer: Self-pay | Admitting: Hematology and Oncology

## 2019-06-17 NOTE — Telephone Encounter (Signed)
Shelly Andrews has been rescheduled to see Dr. Leonides Schanz on 2/25 at 1pm. She been notified of the appt change.

## 2019-06-17 NOTE — Telephone Encounter (Signed)
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 Pt has been cld and scheduled to see Dr. Clelia Croft on 2/25 at 2pm. Pt aware to arrive 15 minutes early.

## 2019-06-26 DIAGNOSIS — Z3A12 12 weeks gestation of pregnancy: Secondary | ICD-10-CM | POA: Diagnosis not present

## 2019-06-26 DIAGNOSIS — Z3682 Encounter for antenatal screening for nuchal translucency: Secondary | ICD-10-CM | POA: Diagnosis not present

## 2019-07-03 ENCOUNTER — Inpatient Hospital Stay: Payer: Medicaid Other

## 2019-07-03 ENCOUNTER — Encounter: Payer: Medicaid Other | Admitting: Oncology

## 2019-07-03 ENCOUNTER — Other Ambulatory Visit: Payer: Self-pay

## 2019-07-03 ENCOUNTER — Encounter: Payer: Self-pay | Admitting: Hematology and Oncology

## 2019-07-03 ENCOUNTER — Inpatient Hospital Stay: Payer: Medicaid Other | Attending: Hematology and Oncology | Admitting: Hematology and Oncology

## 2019-07-03 VITALS — BP 104/66 | HR 89 | Temp 98.5°F | Resp 18 | Ht 63.0 in | Wt 144.6 lb

## 2019-07-03 DIAGNOSIS — Z3A13 13 weeks gestation of pregnancy: Secondary | ICD-10-CM | POA: Diagnosis not present

## 2019-07-03 DIAGNOSIS — D696 Thrombocytopenia, unspecified: Secondary | ICD-10-CM

## 2019-07-03 DIAGNOSIS — D693 Immune thrombocytopenic purpura: Secondary | ICD-10-CM | POA: Insufficient documentation

## 2019-07-03 DIAGNOSIS — O99111 Other diseases of the blood and blood-forming organs and certain disorders involving the immune mechanism complicating pregnancy, first trimester: Secondary | ICD-10-CM | POA: Diagnosis not present

## 2019-07-03 LAB — CBC WITH DIFFERENTIAL (CANCER CENTER ONLY)
Abs Immature Granulocytes: 0.04 10*3/uL (ref 0.00–0.07)
Basophils Absolute: 0 10*3/uL (ref 0.0–0.1)
Basophils Relative: 1 %
Eosinophils Absolute: 0.1 10*3/uL (ref 0.0–0.5)
Eosinophils Relative: 1 %
HCT: 35.9 % — ABNORMAL LOW (ref 36.0–46.0)
Hemoglobin: 12.1 g/dL (ref 12.0–15.0)
Immature Granulocytes: 1 %
Lymphocytes Relative: 24 %
Lymphs Abs: 1.6 10*3/uL (ref 0.7–4.0)
MCH: 32.3 pg (ref 26.0–34.0)
MCHC: 33.7 g/dL (ref 30.0–36.0)
MCV: 95.7 fL (ref 80.0–100.0)
Monocytes Absolute: 0.4 10*3/uL (ref 0.1–1.0)
Monocytes Relative: 7 %
Neutro Abs: 4.4 10*3/uL (ref 1.7–7.7)
Neutrophils Relative %: 66 %
Platelet Count: 128 10*3/uL — ABNORMAL LOW (ref 150–400)
RBC: 3.75 MIL/uL — ABNORMAL LOW (ref 3.87–5.11)
RDW: 12.6 % (ref 11.5–15.5)
WBC Count: 6.5 10*3/uL (ref 4.0–10.5)
nRBC: 0 % (ref 0.0–0.2)

## 2019-07-03 LAB — CMP (CANCER CENTER ONLY)
ALT: 15 U/L (ref 0–44)
AST: 14 U/L — ABNORMAL LOW (ref 15–41)
Albumin: 3.4 g/dL — ABNORMAL LOW (ref 3.5–5.0)
Alkaline Phosphatase: 52 U/L (ref 38–126)
Anion gap: 8 (ref 5–15)
BUN: 10 mg/dL (ref 6–20)
CO2: 25 mmol/L (ref 22–32)
Calcium: 8.6 mg/dL — ABNORMAL LOW (ref 8.9–10.3)
Chloride: 107 mmol/L (ref 98–111)
Creatinine: 0.67 mg/dL (ref 0.44–1.00)
GFR, Est AFR Am: >60 mL/min (ref >60)
GFR, Estimated: >60 mL/min (ref >60)
Glucose, Bld: 79 mg/dL (ref 70–99)
Potassium: 3.9 mmol/L (ref 3.5–5.1)
Sodium: 140 mmol/L (ref 135–145)
Total Bilirubin: 0.3 mg/dL (ref 0.3–1.2)
Total Protein: 6.6 g/dL (ref 6.5–8.1)

## 2019-07-03 LAB — PLATELET BY CITRATE

## 2019-07-03 LAB — SAVE SMEAR(SSMR), FOR PROVIDER SLIDE REVIEW

## 2019-07-03 NOTE — Progress Notes (Signed)
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 Plt nadir of 54 03/12/2013: Plt rebound to 165 without intervention.  06/12/2013: last visit with Dr. Beryle Beams. Platelets normalized to 169,000 05/16/2019: WBC 5.9, Hgb 13.3, Plt 128. MCV 93.8 07/03/2019: Establish care with Dr. Lorenso Courier   CHIEF COMPLAINTS/PURPOSE OF CONSULTATION:  "Low platelets during pregnancy"  HISTORY OF PRESENTING ILLNESS:  Tempie Hoist 39 y.o. female with medical history significant for splenomegaly and idiopathic thrombocytopenia of pregnancy who presents to establish care with Dr. Lorenso Courier.   On review of the previous records Ms. Orsborn has had numerous episodes of thrombocytopenia associated with pregnancy.  She has had 6 pregnancies (including the current one) with 2 pregnancy losses, most recently an ectopic pregnancy in June 2020.  She has followed with Dr. Beryle Beams for the last pregnancy in 2014.  At that time she delivered 1 month early on the 03/02/2013.  She was noted to have a platelet count of 54 which was a nadir for that pregnancy.  When she was reevaluated on 03/12/2013 the platelets had rebounded to 165 without intervention.  Of note she did have a pregnancy in July 2013 which resulted in fetal demise at 7-1/2 months.  Unfortunately she had been receiving steroid therapy due to her thrombocytopenia and she believed that this was the cause of the pregnancy loss.  As such she has requested the steroids not be apart of the treatment regimen for her thrombocytopenia.  On exam today Mrs. Ronnald Ramp notes that she feels well.  She has had an uncomplicated course thus far in the pregnancy and is currently [redacted] weeks pregnant.  She notes that she has had no issues with morning  sickness, though she does endorse having cravings for "everything" and she is having headaches.  She notes that during this pregnancy in prior pregnancies she has never had any issues with nosebleeds, gum bleeding, bruising, or dark stools.  She does not have any other medical conditions other than gestational diabetes.  She is currently scheduled for testing for this on March 1. She had no additional questions or concerns. A full 10 point ROS is listed below.   MEDICAL HISTORY:  Past Medical History:  Diagnosis Date  . Depression   . No pertinent past medical history   . Pregnancy   . Pregnancy, supervision of, high-risk 10/29/2012  . Splenomegaly 04/16/2013  . Thrombocytopenia ([place])    Idopathic with pregnancy    SURGICAL HISTORY: Past Surgical History:  Procedure Laterality Date  . IUD REMOVAL  10/30/2018   Procedure: Intrauterine Device (Iud) Removal;  Surgeon: Mora Bellman, MD;  Location: [facility];  Service: Gynecology;;  . LAPAROSCOPIC UNILATERAL SALPINGO OOPHERECTOMY Right 10/30/2018   Procedure: LAPAROSCOPIC RIGHT SALPINGECTOMY AND REMOVAL OF ECTOPIC PREGNANCY;  Surgeon: Mora Bellman, MD;  Location: [facility];  Service: Gynecology;  Laterality: Right;  . NO PAST SURGERIES      SOCIAL HISTORY: Social History   Socioeconomic History  . Marital status: Single    Spouse name: Not on file  . Number of children: Not on file  . Years of education: Not on file  . Highest education level: Not on file  Occupational History  . Not on file  Tobacco Use  . Smoking status: Never Smoker  . Smokeless tobacco: Never Used  Substance and Sexual Activity  .  Alcohol use: Not Currently    Comment: 1-2 on weekend  . Drug use: No  . Sexual activity: Yes    Birth control/protection: None  Other Topics Concern  . Not on file  Social History Narrative  . Not on file   Social Determinants of Health   Financial Resource Strain:   . Difficulty of Paying Living Expenses: Not on file  Food  Insecurity:   . Worried About Charity fundraiser in the Last Year: Not on file  . Ran Out of Food in the Last Year: Not on file  Transportation Needs:   . Lack of Transportation (Medical): Not on file  . Lack of Transportation (Non-Medical): Not on file  Physical Activity:   . Days of Exercise per Week: Not on file  . Minutes of Exercise per Session: Not on file  Stress:   . Feeling of Stress : Not on file  Social Connections:   . Frequency of Communication with Friends and Family: Not on file  . Frequency of Social Gatherings with Friends and Family: Not on file  . Attends Religious Services: Not on file  . Active Member of Clubs or Organizations: Not on file  . Attends Archivist Meetings: Not on file  . Marital Status: Not on file  Intimate Partner Violence:   . Fear of Current or Ex-Partner: Not on file  . Emotionally Abused: Not on file  . Physically Abused: Not on file  . Sexually Abused: Not on file    FAMILY HISTORY: Family History  Problem Relation Age of Onset  . Diabetes Mother   . Diabetes Maternal Grandmother     ALLERGIES:  is allergic to zoloft [sertraline hcl] and sulfa antibiotics.  MEDICATIONS:  Current Outpatient Medications  Medication Sig Dispense Refill  . Docosahexaenoic Acid (PRENATAL DHA) 200 MG CAPS Prenatal + DHA     No current facility-administered medications for this visit.    REVIEW OF SYSTEMS:   Constitutional: ( - ) fevers, ( - )  chills , ( - ) night sweats Eyes: ( - ) blurriness of vision, ( - ) double vision, ( - ) watery eyes Ears, nose, mouth, throat, and face: ( - ) mucositis, ( - ) sore throat Respiratory: ( - ) cough, ( - ) dyspnea, ( - ) wheezes Cardiovascular: ( - ) palpitation, ( - ) chest discomfort, ( - ) lower extremity swelling Gastrointestinal:  ( - ) nausea, ( - ) heartburn, ( - ) change in bowel habits Skin: ( - ) abnormal skin rashes Lymphatics: ( - ) new lymphadenopathy, ( - ) easy  bruising Neurological: ( - ) numbness, ( - ) tingling, ( - ) new weaknesses Behavioral/Psych: ( - ) mood change, ( - ) new changes  All other systems were reviewed with the patient and are negative.  PHYSICAL EXAMINATION: ECOG PERFORMANCE STATUS: 0 - Asymptomatic  Vitals:   07/03/19 1315  BP: 104/66  Pulse: 89  Resp: 18  Temp: 98.5 F (36.9 C)  SpO2: 99%   Filed Weights   07/03/19 1315  Weight: 144 lb 9.6 oz (65.6 kg)    GENERAL: well appearing middle aged Caucasian female in NAD  SKIN: skin color, texture, turgor are normal, no rashes or significant lesions EYES: conjunctiva are pink and non-injected, sclera clear LUNGS: clear to auscultation and percussion with normal breathing effort HEART: regular rate & rhythm and no murmurs and no lower extremity edema ABDOMEN: soft, non-tender, non-distended, normal bowel  sounds Musculoskeletal: no cyanosis of digits and no clubbing  PSYCH: alert & oriented x 3, fluent speech NEURO: no focal motor/sensory deficits  LABORATORY DATA:  I have reviewed the data as listed CBC Latest Ref Rng & Units 05/16/2019 10/30/2018 06/12/2013  WBC 4.0 - 10.5 K/uL 5.9 22.1(H) 4.8  Hemoglobin 12.0 - 15.0 g/dL 13.3 12.0 12.8  Hematocrit 36.0 - 46.0 % 39.4 36.6 38.2  Platelets 150 - 400 K/uL 128(L) 287 169    CMP Latest Ref Rng & Units 04/16/2013 10/30/2012 07/19/2012  Glucose 70 - 140 mg/dl 95 59(L) 83  BUN 7.0 - 26.0 mg/dL 10.1 4.8(L) 10.6  Creatinine 0.6 - 1.1 mg/dL 0.7 0.6 0.8  Sodium 136 - 145 mEq/L 142 140 139  Potassium 3.5 - 5.1 mEq/L 4.0 3.7 4.1  Chloride 98 - 107 mEq/L - 109(H) 108(H)  CO2 22 - 29 mEq/L _0 Calcium 8.4 - 10.4 mg/dL 9.3 9.1 8.8  Total Protein 6.4 - 8.3 g/dL 6.8 6.1(L) 7.0  Total Bilirubin 0.20 - 1.20 mg/dL 0.54 0.29 0.40  Alkaline Phos 40 - 150 U/L 104 70 150  AST 5 - 34 U/L _1 ALT 0 - 55 U/L 15 <6 Repeated and Verified 11     PATHOLOGY: None relevant to review.   BLOOD FILM:  Review of the peripheral  blood smear showed normal appearing white cells with neutrophils that were appropriately lobated and granulated. There was no predominance of bi-lobed or hyper-segmented neutrophils appreciated. No Dohle bodies were noted. There was no left shifting, immature forms or blasts noted. Lymphocytes remain normal in size without any predominance of large granular lymphocytes. Red cells show no anisopoikilocytosis, macrocytes , microcytes or polychromasia. There were no schistocytes, target cells, echinocytes, acanthocytes, dacrocytes, or stomatocytes.There was no rouleaux formation, nucleated red cells, or intra-cellular inclusions noted. The platelets predominately normal in size, shape, and color without any clumping evident. Rare large platelet.  RADIOGRAPHIC STUDIES: I have personally reviewed the radiological images as listed and agreed with the findings in the report. No results found.  ASSESSMENT & PLAN TRAMYA SCHOENFELDER 39 y.o. female with medical history significant for splenomegaly and idiopathic thrombocytopenia of pregnancy who presents to establish care with Dr. Lorenso Courier.  After review of the prior records, discussion with the patient, and review the labs the patient's findings are most consistent with a chronic idiopathic thrombocytopenia that developed only during times of pregnancy.  She last had a successful pregnancy in 2014 at which time she is being followed by Dr. Beryle Beams.  At that time with no intervention her platelet counts dropped to 54,000 and she had a successful delivery with a rapid rebound just a few weeks later to 165,000.  Of note the patient did have a poor experience with prednisone therapy before and believes that the late term demise of her pregnancy in 2013 was secondary to steroid use.  Therefore she has requested that we avoid steroid therapy if at all possible.  I discussed with her today that we would preferentially avoid steroid therapy until absolutely necessary or just  before the delivery.  She was open to the idea of IVIG therapy.  She notes that she has not had an epidural before and she does not wish to have one.  Therefore platelet target goal would be 50 K just before delivery.  Our treatment threshold would be 30 K at any point in her pregnancy.  Ms. Zendejas voiced her understanding of these findings of this  plan.  We will plan to have the patient return monthly for CBCs to assure the platelet count has not dropped too low.  We have requested strong return precautions for any signs of bleeding, bruising, or dark stools.  We will plan to see her again in approximately 4 months time closer to the time of delivery, but we will have her seen in clinic sooner if any of the lab tests are concerning for a deeper drop in the platelet count.  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 During her last pregnancy in 2014 she was followed by Dr. Beryle Beams. Counts dropped to 54k with rebound to 165k after delivery without intervention. --patient is currently [redacted] weeks pregnant  --today will order CBC, CMP, peripheral blood film, and citrated platelets --extensive prior workup performed for thrombocytopenia here at the [facility]. Bone marrow biopsy not performed due to the clear inciting factor for the findings --we will continue to measure her platelet counts through the duration of her pregnancy. No intervention required for Plt <30k. Prior to deliver the goal for the patient's platelets should be >50k. If epidural is to be performed the goal should be increased to >70k. The patient notes she has no required an epidural before and does not intend to have one at this time. --platelet checks q monthly for the next 4 months with f/u visit at that time.  --RTC in 4 months time for evaluation prior to expected delivery date.   #Splenomegaly, chronic --noted to be 15 cm on CT  scan in 2014 --imaging showed enlarged spleen at 13 cm on 01/19/2012 and 12.5 cm on 10/11/2012. --not known to have liver disease  No orders of the defined types were placed in this encounter.   All questions were answered. The patient knows to call the clinic with any problems, questions or concerns.  A total of more than 60 minutes were spent on this encounter and over half of that time was spent on counseling and coordination of care as outlined above.   Ledell Peoples, MD Department of Hematology/Oncology [place] at [facility] Phone: [phone] Pager: [phone] Email: [email or web address]  07/03/2019 1:21 PM

## 2019-07-04 ENCOUNTER — Telehealth: Payer: Self-pay | Admitting: *Deleted

## 2019-07-04 ENCOUNTER — Telehealth: Payer: Self-pay | Admitting: Hematology and Oncology

## 2019-07-04 NOTE — Telephone Encounter (Signed)
-----   Message from Jaci Standard, MD sent at 07/03/2019  5:05 PM EST ----- Please let Mrs. Norbeck know that her Plt count is 128. We will plan to check this monthly for the next 4 months, with a clinic visit in to be scheduled in June/July. Please have her call with any questions, concerning, or bleeding issues.  Azucena Freed  ----- Message ----- From: Leory Plowman, Lab In [place] Sent: 07/03/2019   2:30 PM EST To: Jaci Standard, MD

## 2019-07-04 NOTE — Telephone Encounter (Signed)
TCT patient regarding lab results from 07/03/19. Spoke with patient and informed her that her platelet count is stable @ 128. Advised that Dr. Leonides Schanz will check labs monthly and will see her in clinic in June or July.  Advised that a scheduler will call her with appts.  She voiced understanding.

## 2019-07-04 NOTE — Telephone Encounter (Signed)
Scheduled per los. Called and spoke with patient. Confirmed all appts  °

## 2019-07-07 DIAGNOSIS — Z3682 Encounter for antenatal screening for nuchal translucency: Secondary | ICD-10-CM | POA: Diagnosis not present

## 2019-07-07 DIAGNOSIS — Z3480 Encounter for supervision of other normal pregnancy, unspecified trimester: Secondary | ICD-10-CM | POA: Diagnosis not present

## 2019-07-14 ENCOUNTER — Telehealth: Payer: Self-pay | Admitting: Hematology and Oncology

## 2019-07-14 NOTE — Telephone Encounter (Signed)
R/s apt per 3/8 sch message - unable to reach pt . Left message with appt date and time

## 2019-07-30 ENCOUNTER — Telehealth: Payer: Self-pay | Admitting: *Deleted

## 2019-07-30 NOTE — Telephone Encounter (Signed)
Records faxed to [facility] att to Shelly Andrews (Referral Co- Ordinator) - release  [phone]

## 2019-07-31 ENCOUNTER — Inpatient Hospital Stay: Payer: Medicaid Other | Attending: Hematology and Oncology

## 2019-07-31 ENCOUNTER — Other Ambulatory Visit: Payer: Self-pay

## 2019-07-31 ENCOUNTER — Other Ambulatory Visit: Payer: Medicaid Other

## 2019-07-31 DIAGNOSIS — D693 Immune thrombocytopenic purpura: Secondary | ICD-10-CM | POA: Insufficient documentation

## 2019-07-31 DIAGNOSIS — D696 Thrombocytopenia, unspecified: Secondary | ICD-10-CM

## 2019-07-31 LAB — CBC WITH DIFFERENTIAL (CANCER CENTER ONLY)
Abs Immature Granulocytes: 0.04 10*3/uL (ref 0.00–0.07)
Basophils Absolute: 0 10*3/uL (ref 0.0–0.1)
Basophils Relative: 0 %
Eosinophils Absolute: 0.1 10*3/uL (ref 0.0–0.5)
Eosinophils Relative: 1 %
HCT: 34.5 % — ABNORMAL LOW (ref 36.0–46.0)
Hemoglobin: 11.7 g/dL — ABNORMAL LOW (ref 12.0–15.0)
Immature Granulocytes: 1 %
Lymphocytes Relative: 27 %
Lymphs Abs: 1.7 10*3/uL (ref 0.7–4.0)
MCH: 32.4 pg (ref 26.0–34.0)
MCHC: 33.9 g/dL (ref 30.0–36.0)
MCV: 95.6 fL (ref 80.0–100.0)
Monocytes Absolute: 0.4 10*3/uL (ref 0.1–1.0)
Monocytes Relative: 7 %
Neutro Abs: 4.1 10*3/uL (ref 1.7–7.7)
Neutrophils Relative %: 64 %
Platelet Count: 119 10*3/uL — ABNORMAL LOW (ref 150–400)
RBC: 3.61 MIL/uL — ABNORMAL LOW (ref 3.87–5.11)
RDW: 13.1 % (ref 11.5–15.5)
WBC Count: 6.4 10*3/uL (ref 4.0–10.5)
nRBC: 0 % (ref 0.0–0.2)

## 2019-08-01 ENCOUNTER — Telehealth: Payer: Self-pay | Admitting: *Deleted

## 2019-08-01 ENCOUNTER — Telehealth: Payer: Self-pay | Admitting: Hematology and Oncology

## 2019-08-01 NOTE — Telephone Encounter (Signed)
-----   Message from Jaci Standard, MD sent at 07/31/2019 10:50 AM EDT ----- Please call Mrs. Shelly Andrews (thrombocytopenia of pregnancy) to let her know she has had a mild drop in her Plt to 119 from 128 at her last visit. We will continue to monitor monthly during her pregnancy.   Azucena Freed  ----- Message ----- From: Leory Plowman, Lab In [place] Sent: 07/31/2019   9:10 AM EDT To: Jaci Standard, MD

## 2019-08-01 NOTE — Telephone Encounter (Signed)
Rescheduled appts per 3/26 sch msg. Pt confirmed new appt times.

## 2019-08-01 NOTE — Telephone Encounter (Signed)
TCT patient regarding lab results from 07/31/19.  Spoke with patient and advised that her platelets had dropped somewhat from prior results - going from 128 to 119. Advised that we will continue to monitor monthly during her pregnancy.  Pt voiced understanding. She is requesting to change her appts to the mornings.  Scheduling message sent

## 2019-08-11 DIAGNOSIS — Z363 Encounter for antenatal screening for malformations: Secondary | ICD-10-CM | POA: Diagnosis not present

## 2019-08-11 DIAGNOSIS — Z3A19 19 weeks gestation of pregnancy: Secondary | ICD-10-CM | POA: Diagnosis not present

## 2019-08-28 ENCOUNTER — Other Ambulatory Visit: Payer: Medicaid Other

## 2019-08-28 ENCOUNTER — Inpatient Hospital Stay: Payer: Medicaid Other | Attending: Hematology and Oncology

## 2019-08-28 ENCOUNTER — Other Ambulatory Visit: Payer: Self-pay

## 2019-08-28 DIAGNOSIS — D696 Thrombocytopenia, unspecified: Secondary | ICD-10-CM

## 2019-08-28 DIAGNOSIS — D693 Immune thrombocytopenic purpura: Secondary | ICD-10-CM | POA: Diagnosis not present

## 2019-08-28 LAB — CBC WITH DIFFERENTIAL (CANCER CENTER ONLY)
Abs Immature Granulocytes: 0.05 10*3/uL (ref 0.00–0.07)
Basophils Absolute: 0 10*3/uL (ref 0.0–0.1)
Basophils Relative: 0 %
Eosinophils Absolute: 0.1 10*3/uL (ref 0.0–0.5)
Eosinophils Relative: 1 %
HCT: 33.4 % — ABNORMAL LOW (ref 36.0–46.0)
Hemoglobin: 11.2 g/dL — ABNORMAL LOW (ref 12.0–15.0)
Immature Granulocytes: 1 %
Lymphocytes Relative: 24 %
Lymphs Abs: 1.3 10*3/uL (ref 0.7–4.0)
MCH: 32.7 pg (ref 26.0–34.0)
MCHC: 33.5 g/dL (ref 30.0–36.0)
MCV: 97.7 fL (ref 80.0–100.0)
Monocytes Absolute: 0.4 10*3/uL (ref 0.1–1.0)
Monocytes Relative: 7 %
Neutro Abs: 3.5 10*3/uL (ref 1.7–7.7)
Neutrophils Relative %: 67 %
Platelet Count: 103 10*3/uL — ABNORMAL LOW (ref 150–400)
RBC: 3.42 MIL/uL — ABNORMAL LOW (ref 3.87–5.11)
RDW: 13.2 % (ref 11.5–15.5)
WBC Count: 5.3 10*3/uL (ref 4.0–10.5)
nRBC: 0 % (ref 0.0–0.2)

## 2019-08-29 ENCOUNTER — Telehealth: Payer: Self-pay | Admitting: *Deleted

## 2019-08-29 NOTE — Telephone Encounter (Signed)
-----   Message from Jaci Standard, MD sent at 08/28/2019  4:53 PM EDT ----- Mrs. Lozon has monthly CBCs due to thrombocytopenia of pregnancy. Please let Mrs. Pautler know that her platelet count is currently 103K, down sligntly from 119k at her last check. We will continue our monthly checks or her Plt count.  Azucena Freed  ----- Message ----- From: Leory Plowman, Lab In [place] Sent: 08/28/2019   8:24 AM EDT To: Jaci Standard, MD

## 2019-08-29 NOTE — Telephone Encounter (Signed)
TCT patient regarding results of yesterday's CBC. Spoke with pt and she is aware of platelet count of 103k.  She states she waits for the results.  Advised that though there is a slight drop in her count, no action is needed at this time.  Pt states she has had IVIG in the past for low platelet count when pregnant.  Advised that we will continue to check CBC monthly. Advised pt to call here if she notices any bleeding as well as her OB. Pt voiced understanding. No further questions or concerns

## 2019-09-01 ENCOUNTER — Other Ambulatory Visit: Payer: Self-pay

## 2019-09-01 ENCOUNTER — Inpatient Hospital Stay (HOSPITAL_COMMUNITY)
Admission: AD | Admit: 2019-09-01 | Discharge: 2019-09-01 | Disposition: A | Discharge status: Home / Self Care | Payer: Medicaid Other | Attending: Obstetrics and Gynecology | Admitting: Obstetrics and Gynecology

## 2019-09-01 ENCOUNTER — Encounter (HOSPITAL_COMMUNITY): Payer: Self-pay | Admitting: Obstetrics and Gynecology

## 2019-09-01 DIAGNOSIS — O26892 Other specified pregnancy related conditions, second trimester: Secondary | ICD-10-CM | POA: Diagnosis not present

## 2019-09-01 DIAGNOSIS — Z882 Allergy status to sulfonamides status: Secondary | ICD-10-CM | POA: Diagnosis not present

## 2019-09-01 DIAGNOSIS — Z3A21 21 weeks gestation of pregnancy: Secondary | ICD-10-CM | POA: Diagnosis not present

## 2019-09-01 DIAGNOSIS — N898 Other specified noninflammatory disorders of vagina: Secondary | ICD-10-CM | POA: Diagnosis not present

## 2019-09-01 LAB — URINALYSIS, ROUTINE W REFLEX MICROSCOPIC
Bacteria, UA: NONE SEEN
Bilirubin Urine: NEGATIVE
Glucose, UA: NEGATIVE mg/dL
Hgb urine dipstick: NEGATIVE
Ketones, ur: NEGATIVE mg/dL
Leukocytes,Ua: NEGATIVE
Nitrite: NEGATIVE
Protein, ur: NEGATIVE mg/dL
Specific Gravity, Urine: 1.024 (ref 1.005–1.030)
pH: 5 (ref 5.0–8.0)

## 2019-09-01 LAB — AMNISURE RUPTURE OF MEMBRANE (ROM) NOT AT ARMC: Amnisure ROM: NEGATIVE

## 2019-09-01 LAB — POCT FERN TEST: POCT Fern Test: NEGATIVE

## 2019-09-01 NOTE — Discharge Instructions (Signed)

## 2019-09-01 NOTE — MAU Note (Signed)
Pt here with reports of possible leaking of fluid. States it happened once yesterday and then again today. Reports she was standing and then felt trickles running down her leg. Pt denies vaginal bleeding. She reports occasional braxton hicks contractions. Reports good fetal movement. History of PTB x3

## 2019-09-01 NOTE — MAU Provider Note (Signed)
History     CSN: [id number]  Arrival date and time: 09/01/19 3614   First Provider Initiated Contact with Patient 09/01/19 2108      Chief Complaint  Patient presents with  . Vaginal Discharge   39 y.o. [id number] @21 .4 wks presenting with leaking fluid. Reports one episode yesterday and one today where she had fluid run down her leg. She is unsure of color but says it smelled sweet. Denies VB or cramping. Has occasional BH ctx. Reports good FM. No recent IC. No vaginal discharge prior.    OB History    Gravida  6   Para  4   Term  1   Preterm  3   AB  1   Living  3     SAB  0   TAB  0   Ectopic  1   Multiple  0   Live Births  3           Past Medical History:  Diagnosis Date  . Depression   . No pertinent past medical history   . Pregnancy   . Pregnancy, supervision of, high-risk 10/29/2012  . Splenomegaly 04/16/2013  . Thrombocytopenia ([facility])    Idopathic with pregnancy    Past Surgical History:  Procedure Laterality Date  . IUD REMOVAL  10/30/2018   Procedure: Intrauterine Device (Iud) Removal;  Surgeon: 11/01/2018, MD;  Location: [facility];  Service: Gynecology;;  . LAPAROSCOPIC UNILATERAL SALPINGO OOPHERECTOMY Right 10/30/2018   Procedure: LAPAROSCOPIC RIGHT SALPINGECTOMY AND REMOVAL OF ECTOPIC PREGNANCY;  Surgeon: 11/01/2018, MD;  Location: [facility];  Service: Gynecology;  Laterality: Right;  . NO PAST SURGERIES    . WISDOM TOOTH EXTRACTION      Family History  Problem Relation Age of Onset  . Diabetes Mother   . Diabetes Maternal Grandmother     Social History   Tobacco Use  . Smoking status: Never Smoker  . Smokeless tobacco: Never Used  Substance Use Topics  . Alcohol use: Not Currently    Comment: 1-2 on weekend  . Drug use: No    Allergies:  Allergies  Allergen Reactions  . Zoloft [Sertraline Hcl] Hives  . Sulfa Antibiotics Hives    Medications Prior to Admission  Medication Sig Dispense Refill Last Dose  .  acetaminophen (TYLENOL) 500 MG tablet Take 500 mg by mouth every 6 (six) hours as needed. Takes 2 tabs   Past Week at Unknown time  . Docosahexaenoic Acid (PRENATAL DHA) 200 MG CAPS Prenatal + DHA   09/01/2019 at Unknown time  . famotidine (PEPCID) 10 MG tablet Take 10 mg by mouth 2 (two) times daily.   09/01/2019 at Unknown time    Review of Systems  Gastrointestinal: Negative for abdominal pain.  Genitourinary: Positive for vaginal discharge. Negative for vaginal bleeding.   Physical Exam   Blood pressure 105/68, pulse 85, temperature 98.9 F (37.2 C), temperature source Oral, resp. rate 20, height 5\' 3"  (1.6 m), weight 66.5 kg, last menstrual period 04/03/2019, SpO2 99 %, unknown if currently breastfeeding.  Physical Exam  Nursing note and vitals reviewed. Constitutional: She is oriented to person, place, and time. She appears well-developed and well-nourished. No distress.  HENT:  Head: Normocephalic and atraumatic.  Cardiovascular: Normal rate.  Respiratory: Effort normal. No respiratory distress.  GI: Soft. She exhibits no distension and no mass. There is no abdominal tenderness. There is no rebound and no guarding.  gravid  Genitourinary:    Genitourinary Comments: SSE: no  pool, fern neg; scant thin white discharge SVE: closed/thick   Musculoskeletal:        General: Normal range of motion.     Cervical back: Normal range of motion.  Neurological: She is alert and oriented to person, place, and time.  Skin: Skin is warm and dry.  Psychiatric: She has a normal mood and affect.  FHT 152  Results for orders placed or performed during the hospital encounter of 09/01/19 (from the past 24 hour(s))  Urinalysis, Routine w reflex microscopic     Status: Abnormal   Collection Time: 09/01/19  9:03 PM  Result Value Ref Range   Color, Urine YELLOW YELLOW   APPearance HAZY (A) CLEAR   Specific Gravity, Urine 1.024 1.005 - 1.030   pH 5.0 5.0 - 8.0   Glucose, UA NEGATIVE NEGATIVE mg/dL    Hgb urine dipstick NEGATIVE NEGATIVE   Bilirubin Urine NEGATIVE NEGATIVE   Ketones, ur NEGATIVE NEGATIVE mg/dL   Protein, ur NEGATIVE NEGATIVE mg/dL   Nitrite NEGATIVE NEGATIVE   Leukocytes,Ua NEGATIVE NEGATIVE   RBC / HPF 0-5 0 - 5 RBC/hpf   WBC, UA 0-5 0 - 5 WBC/hpf   Bacteria, UA NONE SEEN NONE SEEN   Squamous Epithelial / LPF 0-5 0 - 5   Mucus PRESENT   Wet prep, genital     Status: Abnormal (Preliminary result)   Collection Time: 09/01/19  9:22 PM  Result Value Ref Range   Yeast Wet Prep HPF POC NONE SEEN NONE SEEN   Trich, Wet Prep NONE SEEN NONE SEEN   Clue Cells Wet Prep HPF POC PENDING NONE SEEN   WBC, Wet Prep HPF POC MANY (A) NONE SEEN   Sperm NONE SEEN   Amnisure rupture of membrane (rom)not at [facility]     Status: None   Collection Time: 09/01/19  9:22 PM  Result Value Ref Range   Amnisure ROM NEGATIVE   POCT fern test     Status: None   Collection Time: 09/01/19 10:11 PM  Result Value Ref Range   POCT Fern Test Negative = intact amniotic membranes     MAU Course  Procedures  MDM Labs ordered and reviewed. No evidence of SROM or PTL. Stable for discharge home.   Assessment and Plan   1. [redacted] weeks gestation of pregnancy   2. Vaginal discharge during pregnancy in second trimester    Discharge home Follow up at [facility] on 09/11/19 PTL precautions  Allergies as of 09/01/2019      Reactions   Zoloft [sertraline Hcl] Hives   Sulfa Antibiotics Hives      Medication List    TAKE these medications   acetaminophen 500 MG tablet Commonly known as: TYLENOL Take 500 mg by mouth every 6 (six) hours as needed. Takes 2 tabs   famotidine 10 MG tablet Commonly known as: PEPCID Take 10 mg by mouth 2 (two) times daily.   PreNatal DHA 200 MG Caps Generic drug: Docosahexaenoic Acid Prenatal + DHA       Julianne Handler, CNM 09/01/2019, 10:18 PM

## 2019-09-02 LAB — WET PREP, GENITAL
Sperm: NONE SEEN
Trich, Wet Prep: NONE SEEN
Yeast Wet Prep HPF POC: NONE SEEN

## 2019-09-24 DIAGNOSIS — Z3A25 25 weeks gestation of pregnancy: Secondary | ICD-10-CM | POA: Diagnosis not present

## 2019-09-24 DIAGNOSIS — O360912 Maternal care for other rhesus isoimmunization, first trimester, fetus 2: Secondary | ICD-10-CM | POA: Diagnosis not present

## 2019-09-24 DIAGNOSIS — O09512 Supervision of elderly primigravida, second trimester: Secondary | ICD-10-CM | POA: Diagnosis not present

## 2019-09-24 DIAGNOSIS — Z3480 Encounter for supervision of other normal pregnancy, unspecified trimester: Secondary | ICD-10-CM | POA: Diagnosis not present

## 2019-09-25 ENCOUNTER — Other Ambulatory Visit: Payer: Medicaid Other

## 2019-09-25 ENCOUNTER — Inpatient Hospital Stay: Payer: Medicaid Other

## 2019-09-26 ENCOUNTER — Inpatient Hospital Stay: Payer: Medicaid Other

## 2019-09-30 ENCOUNTER — Inpatient Hospital Stay: Payer: Medicaid Other | Attending: Hematology and Oncology

## 2019-09-30 ENCOUNTER — Other Ambulatory Visit: Payer: Self-pay

## 2019-09-30 DIAGNOSIS — D693 Immune thrombocytopenic purpura: Secondary | ICD-10-CM | POA: Diagnosis not present

## 2019-09-30 DIAGNOSIS — O99119 Other diseases of the blood and blood-forming organs and certain disorders involving the immune mechanism complicating pregnancy, unspecified trimester: Secondary | ICD-10-CM | POA: Insufficient documentation

## 2019-09-30 DIAGNOSIS — D696 Thrombocytopenia, unspecified: Secondary | ICD-10-CM

## 2019-09-30 LAB — CBC WITH DIFFERENTIAL (CANCER CENTER ONLY)
Abs Immature Granulocytes: 0.08 10*3/uL — ABNORMAL HIGH (ref 0.00–0.07)
Basophils Absolute: 0 10*3/uL (ref 0.0–0.1)
Basophils Relative: 0 %
Eosinophils Absolute: 0.1 10*3/uL (ref 0.0–0.5)
Eosinophils Relative: 1 %
HCT: 34.9 % — ABNORMAL LOW (ref 36.0–46.0)
Hemoglobin: 11.2 g/dL — ABNORMAL LOW (ref 12.0–15.0)
Immature Granulocytes: 1 %
Lymphocytes Relative: 22 %
Lymphs Abs: 1.4 10*3/uL (ref 0.7–4.0)
MCH: 31.5 pg (ref 26.0–34.0)
MCHC: 32.1 g/dL (ref 30.0–36.0)
MCV: 98 fL (ref 80.0–100.0)
Monocytes Absolute: 0.5 10*3/uL (ref 0.1–1.0)
Monocytes Relative: 8 %
Neutro Abs: 4.2 10*3/uL (ref 1.7–7.7)
Neutrophils Relative %: 68 %
Platelet Count: 110 10*3/uL — ABNORMAL LOW (ref 150–400)
RBC: 3.56 MIL/uL — ABNORMAL LOW (ref 3.87–5.11)
RDW: 13.1 % (ref 11.5–15.5)
WBC Count: 6.2 10*3/uL (ref 4.0–10.5)
nRBC: 0 % (ref 0.0–0.2)

## 2019-10-02 ENCOUNTER — Telehealth: Payer: Self-pay | Admitting: *Deleted

## 2019-10-02 NOTE — Telephone Encounter (Signed)
TCT patient regarding recent lab results. Spoke with patient and advised that her platelet count was stable @ 110k. We will continue to monitor monthly. She states her delivery date in still early August.  She voices understanding about monthly platelet check.

## 2019-10-02 NOTE — Telephone Encounter (Signed)
-----   Message from Jaci Standard, MD sent at 09/30/2019  2:21 PM EDT ----- Please let Mrs. Engebretson know her Plt count is stable at 110. We should be seeing her in about 1 month's time in clinic, assuming they have not moved the delivery date.  Azucena Freed  ----- Message ----- From: Leory Plowman, Lab In [place] Sent: 09/30/2019   9:16 AM EDT To: Jaci Standard, MD

## 2019-10-07 ENCOUNTER — Other Ambulatory Visit: Payer: Self-pay

## 2019-10-07 ENCOUNTER — Inpatient Hospital Stay (HOSPITAL_COMMUNITY)
Admission: AD | Admit: 2019-10-07 | Discharge: 2019-10-07 | Disposition: A | Discharge status: Home / Self Care | Payer: Medicaid Other | Attending: Obstetrics and Gynecology | Admitting: Obstetrics and Gynecology

## 2019-10-07 ENCOUNTER — Encounter (HOSPITAL_COMMUNITY): Payer: Self-pay | Admitting: Obstetrics and Gynecology

## 2019-10-07 DIAGNOSIS — O26892 Other specified pregnancy related conditions, second trimester: Secondary | ICD-10-CM | POA: Insufficient documentation

## 2019-10-07 DIAGNOSIS — F329 Major depressive disorder, single episode, unspecified: Secondary | ICD-10-CM | POA: Diagnosis not present

## 2019-10-07 DIAGNOSIS — Z6379 Other stressful life events affecting family and household: Secondary | ICD-10-CM

## 2019-10-07 DIAGNOSIS — O99112 Other diseases of the blood and blood-forming organs and certain disorders involving the immune mechanism complicating pregnancy, second trimester: Secondary | ICD-10-CM | POA: Insufficient documentation

## 2019-10-07 DIAGNOSIS — Z881 Allergy status to other antibiotic agents status: Secondary | ICD-10-CM | POA: Diagnosis not present

## 2019-10-07 DIAGNOSIS — R12 Heartburn: Secondary | ICD-10-CM | POA: Diagnosis not present

## 2019-10-07 DIAGNOSIS — Z888 Allergy status to other drugs, medicaments and biological substances status: Secondary | ICD-10-CM | POA: Insufficient documentation

## 2019-10-07 DIAGNOSIS — O99342 Other mental disorders complicating pregnancy, second trimester: Secondary | ICD-10-CM | POA: Diagnosis not present

## 2019-10-07 DIAGNOSIS — O99612 Diseases of the digestive system complicating pregnancy, second trimester: Secondary | ICD-10-CM | POA: Diagnosis not present

## 2019-10-07 DIAGNOSIS — E860 Dehydration: Secondary | ICD-10-CM | POA: Insufficient documentation

## 2019-10-07 DIAGNOSIS — Z3A27 27 weeks gestation of pregnancy: Secondary | ICD-10-CM

## 2019-10-07 DIAGNOSIS — D696 Thrombocytopenia, unspecified: Secondary | ICD-10-CM | POA: Diagnosis not present

## 2019-10-07 LAB — URINALYSIS, ROUTINE W REFLEX MICROSCOPIC
Bilirubin Urine: NEGATIVE
Glucose, UA: NEGATIVE mg/dL
Hgb urine dipstick: NEGATIVE
Ketones, ur: 20 mg/dL — AB
Leukocytes,Ua: NEGATIVE
Nitrite: NEGATIVE
Protein, ur: NEGATIVE mg/dL
Specific Gravity, Urine: 1.008 (ref 1.005–1.030)
pH: 6 (ref 5.0–8.0)

## 2019-10-07 LAB — PROTEIN / CREATININE RATIO, URINE
Creatinine, Urine: 57.77 mg/dL
Protein Creatinine Ratio: 0.16 mg/mg{Cre} — ABNORMAL HIGH (ref 0.00–0.15)
Total Protein, Urine: 9 mg/dL

## 2019-10-07 LAB — CBC
HCT: 37.4 % (ref 36.0–46.0)
Hemoglobin: 12.6 g/dL (ref 12.0–15.0)
MCH: 32.3 pg (ref 26.0–34.0)
MCHC: 33.7 g/dL (ref 30.0–36.0)
MCV: 95.9 fL (ref 80.0–100.0)
Platelets: 120 10*3/uL — ABNORMAL LOW (ref 150–400)
RBC: 3.9 MIL/uL (ref 3.87–5.11)
RDW: 12.9 % (ref 11.5–15.5)
WBC: 6.6 10*3/uL (ref 4.0–10.5)
nRBC: 0 % (ref 0.0–0.2)

## 2019-10-07 LAB — COMPREHENSIVE METABOLIC PANEL
ALT: 18 U/L (ref 0–44)
AST: 20 U/L (ref 15–41)
Albumin: 2.9 g/dL — ABNORMAL LOW (ref 3.5–5.0)
Alkaline Phosphatase: 86 U/L (ref 38–126)
Anion gap: 12 (ref 5–15)
BUN: 6 mg/dL (ref 6–20)
CO2: 20 mmol/L — ABNORMAL LOW (ref 22–32)
Calcium: 8.7 mg/dL — ABNORMAL LOW (ref 8.9–10.3)
Chloride: 104 mmol/L (ref 98–111)
Creatinine, Ser: 0.65 mg/dL (ref 0.44–1.00)
GFR calc Af Amer: >60 mL/min (ref >60)
GFR calc non Af Amer: >60 mL/min (ref >60)
Glucose, Bld: 78 mg/dL (ref 70–99)
Potassium: 3.6 mmol/L (ref 3.5–5.1)
Sodium: 136 mmol/L (ref 135–145)
Total Bilirubin: 1.2 mg/dL (ref 0.3–1.2)
Total Protein: 6 g/dL — ABNORMAL LOW (ref 6.5–8.1)

## 2019-10-07 MED ORDER — LIDOCAINE VISCOUS HCL 2 % MT SOLN
15.0000 mL | Freq: Once | OROMUCOSAL | Status: AC
  Administered 2019-10-07: 15 mL via ORAL
  Filled 2019-10-07: qty 15

## 2019-10-07 MED ORDER — PANTOPRAZOLE SODIUM 40 MG PO TBEC: 40.0000 mg | DELAYED_RELEASE_TABLET | Freq: Every day | ORAL | 0 refills | Status: AC

## 2019-10-07 MED ORDER — LACTATED RINGERS IV BOLUS
1000.0000 mL | Freq: Once | INTRAVENOUS | Status: AC
  Administered 2019-10-07: 1000 mL via INTRAVENOUS

## 2019-10-07 MED ORDER — ALUM & MAG HYDROXIDE-SIMETH 200-200-20 MG/5ML PO SUSP
30.0000 mL | Freq: Once | ORAL | Status: AC
  Administered 2019-10-07: 30 mL via ORAL
  Filled 2019-10-07: qty 30

## 2019-10-07 MED ORDER — ACETAMINOPHEN 500 MG PO TABS
1000.0000 mg | ORAL_TABLET | Freq: Once | ORAL | Status: AC
  Administered 2019-10-07: 1000 mg via ORAL
  Filled 2019-10-07: qty 2

## 2019-10-07 NOTE — MAU Provider Note (Signed)
Chief Complaint:  Dehydration   First Provider Initiated Contact with Patient 10/07/19 1353     HPI: Shelly Andrews is a 39 y.o. [id number] at [redacted]w[redacted]d who presents to maternity admissions reporting dehydration. Symptoms started 2 days ago. States she has not been eating or drinking much at home. Went to her ob this morning & was unable to give a urine sample, so was sent here for IV fluids.  Had a cup of water this morning and has not eaten. States she felt somewhat dizzy at work but no syncopal episode. Denies any other symptoms.  Reports stress at home due to issues with her significant other and thinks this is contributing to her symptoms.   Pregnancy Course: [facility]. Hx PTD.   Past Medical History:  Diagnosis Date  . Depression   . Splenomegaly 04/16/2013  . 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 IUD REMOVAL  10/30/2018   Procedure: Intrauterine Device (Iud) Removal;  Surgeon: Mora Bellman, MD;  Location: [facility];  Service: Gynecology;;  . LAPAROSCOPIC UNILATERAL SALPINGO OOPHERECTOMY Right 10/30/2018   Procedure: LAPAROSCOPIC RIGHT SALPINGECTOMY AND REMOVAL OF ECTOPIC PREGNANCY;  Surgeon: Mora Bellman, MD;  Location: [facility];  Service: Gynecology;  Laterality: Right;  . WISDOM TOOTH EXTRACTION     Family History  Problem Relation Age of Onset  . Diabetes Mother   . Diabetes Maternal Grandmother    Social History   Tobacco Use  . Smoking status: Never Smoker  .  Smokeless tobacco: Never Used  Substance Use Topics  . Alcohol use: Not Currently    Comment: 1-2 on weekend  . Drug use: No   Allergies  Allergen Reactions  . Zoloft [Sertraline Hcl] Hives  . Sulfa Antibiotics Hives   Medications Prior to Admission  Medication Sig Dispense Refill Last Dose  . acetaminophen (TYLENOL) 500 MG tablet Take 1,000 mg by mouth every 6 (six) hours as needed for mild pain or headache.    Past Month at Unknown time  . Docosahexaenoic Acid (PRENATAL DHA) 200 MG CAPS Prenatal + DHA   10/07/2019 at Unknown time  . famotidine (PEPCID) 20 MG tablet Take 20 mg by mouth 2 (two) times daily.    10/07/2019 at Unknown time    I have reviewed patient's Past Medical Hx, Surgical Hx, Family Hx, Social Hx, medications and allergies.   ROS:  Review of Systems  Constitutional: Negative.   Gastrointestinal: Negative.   Genitourinary: Negative.   Psychiatric/Behavioral: Negative for self-injury and suicidal ideas.    Physical Exam   Patient Vitals for the past 24 hrs:  BP Temp Temp src Pulse Resp SpO2 Height Weight  10/07/19 1400 106/72 -- -- (!) 103 -- -- -- --  10/07/19 1345 98/69 -- -- (!) 106 -- -- -- --  10/07/19 1344 109/66 -- -- (!) 105 -- -- -- --  10/07/19 1255 (!) 123/97 98.4 F (36.9 C) Oral (!) 105 16 100 % 5\' 3"  (1.6 m) 65.2 kg    Constitutional: Well-developed, well-nourished female in no acute distress.  Cardiovascular: normal rate & rhythm, no murmur Respiratory: normal effort, lung sounds clear throughout GI: Abd soft, non-tender, gravid appropriate for gestational age. Pos BS x 4 MS: Extremities nontender, no edema, normal ROM Neurologic: Alert and oriented x 4.   NST:  Baseline: 145 bpm, Variability: Good {> 6 bpm), Accelerations: Non-reactive but appropriate for gestational age and Decelerations: Absent   Labs: Results for orders placed or performed during the hospital encounter of 10/07/19 (from the past 24 hour(s))  Urinalysis, Routine w  reflex microscopic     Status: Abnormal   Collection Time: 10/07/19  1:27 PM  Result Value Ref Range   Color, Urine YELLOW YELLOW   APPearance HAZY (A) CLEAR   Specific Gravity, Urine 1.008 1.005 - 1.030   pH 6.0 5.0 - 8.0   Glucose, UA NEGATIVE NEGATIVE mg/dL   Hgb urine dipstick NEGATIVE NEGATIVE   Bilirubin Urine NEGATIVE NEGATIVE   Ketones, ur 20 (A) NEGATIVE mg/dL   Protein, ur NEGATIVE NEGATIVE mg/dL   Nitrite NEGATIVE NEGATIVE   Leukocytes,Ua NEGATIVE NEGATIVE  Protein / creatinine ratio, urine     Status: Abnormal   Collection Time: 10/07/19  1:42 PM  Result Value Ref Range   Creatinine, Urine 57.77 mg/dL   Total Protein, Urine 9 mg/dL   Protein Creatinine Ratio 0.16 (H) 0.00 - 0.15 mg/mg[Cre]  CBC     Status: Abnormal   Collection Time: 10/07/19  2:21 PM  Result Value Ref Range   WBC 6.6 4.0 - 10.5 K/uL   RBC 3.90 3.87 - 5.11 MIL/uL   Hemoglobin 12.6 12.0 - 15.0 g/dL   HCT 12/07/19 54.5 - 62.5 %   MCV 95.9 80.0 - 100.0 fL   MCH 32.3 26.0 - 34.0 pg   MCHC 33.7 30.0 - 36.0 g/dL   RDW 63.8 93.7 - 34.2 %   Platelets 120 (L) 150 - 400 K/uL   nRBC 0.0 0.0 - 0.2 %  Comprehensive metabolic panel     Status: Abnormal   Collection Time: 10/07/19  2:21 PM  Result Value Ref Range   Sodium 136 135 - 145 mmol/L   Potassium 3.6 3.5 - 5.1 mmol/L   Chloride 104 98 - 111 mmol/L   CO2 20 (L) 22 - 32 mmol/L   Glucose, Bld 78 70 - 99 mg/dL   BUN 6 6 - 20 mg/dL   Creatinine, Ser 12/07/19 0.44 - 1.00 mg/dL   Calcium 8.7 (L) 8.9 - 10.3 mg/dL   Total Protein 6.0 (L) 6.5 - 8.1 g/dL   Albumin 2.9 (L) 3.5 - 5.0 g/dL   AST 20 15 - 41 U/L   ALT 18 0 - 44 U/L   Alkaline Phosphatase 86 38 - 126 U/L   Total Bilirubin 1.2 0.3 - 1.2 mg/dL   GFR calc non Af Amer >60 >60 mL/min   GFR calc Af Amer >60 >60 mL/min   Anion gap 12 5 - 15    Imaging:  No results found.  MAU Course: Orders Placed This Encounter  Procedures  . Urinalysis, Routine w reflex microscopic  . Protein / creatinine ratio,  urine  . CBC  . Comprehensive metabolic panel  .  Discharge patient   Meds ordered this encounter  Medications  . lactated ringers bolus 1,000 mL  . acetaminophen (TYLENOL) tablet 1,000 mg  . AND Linked Order Group   . alum & mag hydroxide-simeth (MAALOX/MYLANTA) 200-200-20 MG/5ML suspension 30 mL   . lidocaine (XYLOCAINE) 2 % viscous mouth solution 15 mL  . pantoprazole (PROTONIX) 40 MG tablet    Sig: Take 1 tablet (40 mg total) by mouth daily.    Dispense:  30 tablet    Refill:  0    Order Specific Question:   Supervising Provider    Answer:   Shelbie Proctor    MDM: Fetal tracing appropriate for gestational age. Patient with no OB complaints today.   Initial BP elevated. Repeats all normal. PEC labs negative.   Per patient, symptoms likely due to stress at home. Will get labs to assess for potential underlying issue. Patient prefers IV fluids for hydration. Given 1 liter of LR.  Labs reassuring.   Patient reports feeling better after IVF but requesting meds for headache & heartburn. Given tylenol & maalox/lidocaine -- reports improvement in symptoms & ready to be discharged home. Currently taking pepcid BID but states it's not working. Has used protonix in the past with more success & would like to use that instead. Will rx protonix.   Assessment: 1. Heartburn during pregnancy in second trimester   2. Stressful life event affecting family   3. [redacted] weeks gestation of pregnancy     Plan: Discharge home in stable condition.  D/c pepcid Rx protonix Given list of therapy resources   Follow-up Information    , [facility] Follow up.   Contact information: [address] [place] [address] [phone]           Allergies as of 10/07/2019      Reactions   Zoloft [sertraline Hcl] Hives   Sulfa Antibiotics Hives      Medication List    STOP taking these medications   famotidine 20 MG tablet Commonly known as: PEPCID      TAKE these medications   acetaminophen 500 MG tablet Commonly known as: TYLENOL Take 1,000 mg by mouth every 6 (six) hours as needed for mild pain or headache.   pantoprazole 40 MG tablet Commonly known as: Protonix Take 1 tablet (40 mg total) by mouth daily.   PreNatal DHA 200 MG Caps Generic drug: Docosahexaenoic Acid Prenatal + DHA       Judeth Horn, NP 10/07/2019 5:42 PM

## 2019-10-07 NOTE — Discharge Instructions (Signed)
Psychiatric Services [facility] of Care  [address], [place], [place]  [place] [address] [place], [place] [phone] or [phone] [email or web address]  [facility], 8:30-5:00 [address] [place], [place] [email or web address]  *Bring your own interpreter at 1st visit  [place] [address], [place], [place], [place] [email or web address]   [facility] Services/[facility] Services  [address] [place], [place]  [facility] Walk-in Mon-Fri, 8am-3pm [address] [place], [place] [email or web address]  [place], [place], [place]  [place] [address], [place], [place]  [facility] of the [place] [address] [place], [place] que hablen espanol, favor comunicarse con el Sr. [place], extension [phone] o la [address], extension [place] para hacer una cita.   [facility] [address] "[place]" [phone] (Habla Espanol)  [facility] [place], [place], [place]  [place] [address], [place], [place]   [facility])  [address] [place], [place], [place] [email or web address]  *Uninsured and underinsured, ages 19-64  [facility] [place], [place], [place] ([place])  [facility] [address], [place], [place] (Habla Espanol)  [facility] [address], [place], [place] Cinda Quest)  [place] 8:30am-8:00pm/ Fri 8:30-7:00pm [address] [place], [place] (3rd floor, located at corner of [address] and [address]) Call [phone] to schedule an appointment [email or web address]  [facility] [address] [place], [place]  [place], [place], [place]  [place] ([place]) [phone] or [email or web address] [address], [place], [place], [place] [address] * Recovery support and educational programs, including recovery skills classes, support groups, and one-on-one sessions with [place] Certified Peer Support Specialists.    [facility] ([place]) [place]: [phone] * Family and [place] at [phone] for more information * Family to [employer] and Basics Class : enroll online or email Mellody Dance at [email or web address]  * Monthly educational meetings, contact [place] at [phone] [email or web address]    24- Hour Availability:  *[place] [phone] or [phone]  * Family Service of the [employer] (Domestic Violence, Rape, Victim Assistance) Line [phone]  [place] Sessions [phone] or [phone]  * [place]  [phone] [phone] hours)  *[facility] Mobile Crisis Unit [phone]  *[place] National Suicide Hotline [phone]      Heartburn During Pregnancy Heartburn is a type of pain or discomfort that can happen in the throat or chest. It is often described as a burning sensation. Heartburn is common during pregnancy because:  A hormone (progesterone) that is released during pregnancy may relax the valve (lower esophageal sphincter, or LES) that separates the esophagus from  the stomach. This allows stomach acid to move up into the esophagus, causing heartburn.  The uterus gets larger and pushes up on the stomach, which pushes more acid into the esophagus. This is especially true in the later stages of pregnancy. Heartburn usually goes away or gets better after giving birth. What are the causes? Heartburn is caused by stomach acid backing up into the esophagus (reflux). Reflux can  be triggered by:  Changing hormone levels.  Large meals.  Certain foods and beverages, such as coffee, chocolate, onions, and peppermint.  Exercise.  Increased stomach acid production. What increases the risk? You are more likely to experience heartburn during pregnancy if you:  Had heartburn prior to becoming pregnant.  Have been pregnant more than once before.  Are overweight or obese. The likelihood that you will get heartburn also increases as you get farther along in your pregnancy, especially during the last trimester. What are the signs or symptoms? Symptoms of this condition include:  Burning pain in the chest or lower throat.  Bitter taste in the mouth.  Coughing.  Problems swallowing.  Vomiting.  Hoarse voice.  Asthma. Symptoms may get worse when you lie down or bend over. Symptoms are often worse at night. How is this diagnosed? This condition is diagnosed based on:  Your medical history.  Your symptoms.  Blood tests to check for a certain type of bacteria associated with heartburn.  Whether taking heartburn medicine relieves your symptoms.  Examination of the stomach and esophagus using a tube with a light and camera on the end (endoscopy). How is this treated? Treatment varies depending on how severe your symptoms are. Your health care provider may recommend:  Over-the-counter medicines (antacids or acid reducers) for mild heartburn.  Prescription medicines to decrease stomach acid or to protect your stomach lining.  Certain changes in  your diet.  Raising the head of your bed so it is higher than the foot of the bed. This helps prevent stomach acid from backing up into the esophagus when you are lying down. Follow these instructions at home: Eating and drinking  Do not drink alcohol during your pregnancy.  Identify foods and beverages that make your symptoms worse, and avoid them.  Beverages that you may want to avoid include: ? Coffee and tea (with or without caffeine). ? Energy drinks and sports drinks. ? Carbonated drinks or sodas. ? Citrus fruit juices.  Foods that you may want to avoid include: ? Chocolate and cocoa. ? Peppermint and mint flavorings. ? Garlic, onions, and horseradish. ? Spicy and acidic foods, including peppers, chili powder, curry powder, vinegar, hot sauces, and barbecue sauce. ? Citrus fruits, such as oranges, lemons, and limes. ? Tomato-based foods, such as red sauce, chili, and salsa. ? Fried and fatty foods, such as donuts, french fries, potato chips, and high-fat dressings. ? High-fat meats, such as hot dogs, cold cuts, sausage, ham, and bacon. ? High-fat dairy items, such as whole milk, butter, and cheese.  Eat small, frequent meals instead of large meals.  Avoid drinking large amounts of liquid with your meals.  Avoid eating meals during the 2-3 hours before bedtime.  Avoid lying down right after you eat.  Do not exercise right after you eat. Medicines  Take over-the-counter and prescription medicines only as told by your health care provider.  Do not take aspirin, ibuprofen, or other NSAIDs unless your health care provider tells you to do that.  You may be instructed to avoid medicines that contain sodium bicarbonate. General instructions   If directed, raise the head of your bed about 6 inches (15 cm) by putting blocks under the legs. Sleeping with more pillows does not effectively relieve heartburn because it only changes the position of your head.  Do not use any  products that contain nicotine or tobacco, such as cigarettes and e-cigarettes. If you need help quitting, ask your health care provider.  Wear loose-fitting clothing.  Try to reduce your stress, such as with yoga or meditation. If you need help managing stress, ask your health care provider.  Maintain a healthy weight. If you are overweight, work with your health care provider to safely lose weight.  Keep all follow-up visits as told by your health care provider. This is important. Contact a health care provider if:  You develop new symptoms.  Your symptoms do not improve with treatment.  You have unexplained weight loss.  You have difficulty swallowing.  You make loud sounds when you breathe (wheeze).  You have a cough that does not go away.  You have frequent heartburn for more than 2 weeks.  You have nausea or vomiting that does not get better with treatment.  You have pain in your abdomen. Get help right away if:  You have severe chest pain that spreads to your arm, neck, or jaw.  You feel sweaty, dizzy, or light-headed.  You have shortness of breath.  You have pain when swallowing.  You vomit, and your vomit looks like blood or coffee grounds.  Your stool is bloody or black. This information is not intended to replace advice given to you by your health care provider. Make sure you discuss any questions you have with your health care provider. Document Revised: 07/23/2017 Document Reviewed: 01/10/2016 Elsevier Patient Education  2020 [employer].

## 2019-10-07 NOTE — MAU Note (Signed)
Haven't been able to drink much, hasn't been able to urinate much. Was unable to give sample at dr's office, felt shaky like she was going to pass out at work.  Denies vomiting, "just hasn't felt right".

## 2019-10-14 DIAGNOSIS — O99810 Abnormal glucose complicating pregnancy: Secondary | ICD-10-CM | POA: Diagnosis not present

## 2019-10-21 DIAGNOSIS — Z3A29 29 weeks gestation of pregnancy: Secondary | ICD-10-CM | POA: Diagnosis not present

## 2019-10-21 DIAGNOSIS — O09213 Supervision of pregnancy with history of pre-term labor, third trimester: Secondary | ICD-10-CM | POA: Diagnosis not present

## 2019-10-22 ENCOUNTER — Other Ambulatory Visit: Payer: Self-pay | Admitting: Hematology and Oncology

## 2019-10-22 DIAGNOSIS — D696 Thrombocytopenia, unspecified: Secondary | ICD-10-CM

## 2019-10-22 NOTE — Progress Notes (Signed)
[facility] [phone]   [phone]  PROGRESS NOTE  Patient Care Team: Nolene Ebbs, MD as PCP - General (Internal Medicine)  Hematological/Oncological History  # Chronic Idiopathic Thrombocytopenic Purpura 1) 03/02/2013: delivered healthy child. Plt nadir of 54 2) 03/12/2013: Plt rebound to 165 without intervention.  3) 06/12/2013: last visit with Dr. Beryle Beams. Platelets normalized to 169,000 4) 05/16/2019: WBC 5.9, Hgb 13.3, Plt 128. MCV 93.8 07/03/2019: Establish care with Dr. Lorenso Courier. WBC 6.5, Hgb 12.1, MCV 95.7, Plt 128. 5) 10/07/2019: WBC 6.6, Hgb 12.6, MCV 95.9, Plt 120 6) 10/23/2019: WBC 5.6, Hgb 10.5, Plt 86, MCV 95.8   Interval History:  Shelly Andrews 39 y.o. female with medical history significant for chronic ITP who presents for a follow up visit. The patient's last visit was on 07/03/2019 at which time she established care. In the interim since the last visit she had an OB/GYN visit on 10/07/2019 for dehydration.   On exam today Shelly Andrews notes that she has had a decrease in energy since our last visit.  She reports that she has been doing well overall with the pregnancy, but has had some bleeding with her gums when she brushes her teeth.  She also notes that sometimes she tastes blood in her mouth but denies having heavy blood loss such as nosebleeds, dark stools, or bright red blood per rectum.  She reports that she is currently 29 weeks and 3 days into her pregnancy.  She has been trying to keep up with hydration but notes that she did recently require IV fluids.  Otherwise a full 10 point ROS is listed below.  MEDICAL HISTORY:  Past Medical History:  Diagnosis Date  . Depression   . Splenomegaly 04/16/2013  . Thrombocytopenia ([place])    Idopathic with pregnancy    SURGICAL HISTORY: Past Surgical History:  Procedure Laterality Date  . IUD REMOVAL  10/30/2018   Procedure: Intrauterine Device (Iud) Removal;  Surgeon: Mora Bellman,  MD;  Location: [facility];  Service: Gynecology;;  . LAPAROSCOPIC UNILATERAL SALPINGO OOPHERECTOMY Right 10/30/2018   Procedure: LAPAROSCOPIC RIGHT SALPINGECTOMY AND REMOVAL OF ECTOPIC PREGNANCY;  Surgeon: Mora Bellman, MD;  Location: [facility];  Service: Gynecology;  Laterality: Right;  . WISDOM TOOTH EXTRACTION      SOCIAL HISTORY: Social History   Socioeconomic History  . Marital status: Single    Spouse name: Not on file  . Number of children: Not on file  . Years of education: Not on file  . Highest education level: Not on file  Occupational History  . Not on file  Tobacco Use  . Smoking status: Never Smoker  . Smokeless tobacco: Never Used  Vaping Use  . Vaping Use: Never used  Substance and Sexual Activity  . Alcohol use: Not Currently    Comment: 1-2 on weekend  . Drug use: No  . Sexual activity: Yes    Birth control/protection: None  Other Topics Concern  . Not on file  Social History Narrative  . Not on file   Social Determinants of Health   Financial Resource Strain:   . Difficulty of Paying Living Expenses:   Food Insecurity:   . Worried About Charity fundraiser in the Last Year:   . Arboriculturist in the Last Year:   Transportation Needs:   . Film/video editor (Medical):   Marland Kitchen Lack of Transportation (Non-Medical):   Physical Activity:   . Days of Exercise per Week:   .  Minutes of Exercise per Session:   Stress:   . Feeling of Stress :   Social Connections:   . Frequency of Communication with Friends and Family:   . Frequency of Social Gatherings with Friends and Family:   . Attends Religious Services:   . Active Member of Clubs or Organizations:   . Attends Archivist Meetings:   Marland Kitchen Marital Status:   Intimate Partner Violence:   . Fear of Current or Ex-Partner:   . Emotionally Abused:   Marland Kitchen Physically Abused:   . Sexually Abused:     FAMILY HISTORY: Family History  Problem Relation Age of Onset  . Diabetes Mother   . Diabetes  Maternal Grandmother     ALLERGIES:  is allergic to zoloft [sertraline hcl] and sulfa antibiotics.  MEDICATIONS:  Current Outpatient Medications  Medication Sig Dispense Refill  . acetaminophen (TYLENOL) 500 MG tablet Take 1,000 mg by mouth every 6 (six) hours as needed for mild pain or headache.     . Docosahexaenoic Acid (PRENATAL DHA) 200 MG CAPS Prenatal + DHA    . pantoprazole (PROTONIX) 40 MG tablet Take 1 tablet (40 mg total) by mouth daily. 30 tablet 0   No current facility-administered medications for this visit.    REVIEW OF SYSTEMS:   Constitutional: ( - ) fevers, ( - )  chills , ( - ) night sweats Eyes: ( - ) blurriness of vision, ( - ) double vision, ( - ) watery eyes Ears, nose, mouth, throat, and face: ( - ) mucositis, ( - ) sore throat Respiratory: ( - ) cough, ( - ) dyspnea, ( - ) wheezes Cardiovascular: ( - ) palpitation, ( - ) chest discomfort, ( - ) lower extremity swelling Gastrointestinal:  ( - ) nausea, ( - ) heartburn, ( - ) change in bowel habits Skin: ( - ) abnormal skin rashes Lymphatics: ( - ) new lymphadenopathy, ( - ) easy bruising Neurological: ( - ) numbness, ( - ) tingling, ( - ) new weaknesses Behavioral/Psych: ( - ) mood change, ( - ) new changes  All other systems were reviewed with the patient and are negative.  PHYSICAL EXAMINATION: Vitals:   10/23/19 0837 10/23/19 0838  BP: (!) 89/55 (!) 92/54  Pulse: 83   Resp: 16   Temp: (!) 97.5 F (36.4 C)   SpO2: 100%    Filed Weights   10/23/19 0837  Weight: 148 lb 4.8 oz (67.3 kg)    GENERAL: well appearing middle aged Caucasian female in NAD  SKIN: skin color, texture, turgor are normal, no rashes or significant lesions EYES: conjunctiva are pink and non-injected, sclera clear LUNGS: clear to auscultation and percussion with normal breathing effort HEART: regular rate & rhythm and no murmurs and no lower extremity edema ABDOMEN: gravid uterus Musculoskeletal: no cyanosis of digits and no  clubbing  PSYCH: alert & oriented x 3, fluent speech NEURO: no focal motor/sensory deficits  LABORATORY DATA:  I have reviewed the data as listed CBC Latest Ref Rng & Units 10/23/2019 10/07/2019 09/30/2019  WBC 4.0 - 10.5 K/uL 5.6 6.6 6.2  Hemoglobin 12.0 - 15.0 g/dL 10.5(L) 12.6 11.2(L)  Hematocrit 36 - 46 % 32.0(L) 37.4 34.9(L)  Platelets 150 - 400 K/uL 86(L) 120(L) 110(L)    CMP Latest Ref Rng & Units 10/23/2019 10/07/2019 07/03/2019  Glucose 70 - 99 mg/dL 142(H) 78 79  BUN 6 - 20 mg/dL 5(L) 6 10  Creatinine 0.44 - 1.00 mg/dL 0.68 0.65  0.67  Sodium 135 - 145 mmol/L 139 136 140  Potassium 3.5 - 5.1 mmol/L 3.2(L) 3.6 3.9  Chloride 98 - 111 mmol/L 108 104 107  CO2 22 - 32 mmol/L 21(L) 20(L) 25  Calcium 8.9 - 10.3 mg/dL 8.1(L) 8.7(L) 8.6(L)  Total Protein 6.5 - 8.1 g/dL 5.5(L) 6.0(L) 6.6  Total Bilirubin 0.3 - 1.2 mg/dL 0.5 1.2 0.3  Alkaline Phos 38 - 126 U/L 90 86 52  AST 15 - 41 U/L 16 20 14(L)  ALT 0 - 44 U/L '12 18 15   '$ RADIOGRAPHIC STUDIES: No results found.  ASSESSMENT & PLAN Shelly Andrews 39 y.o. female with medical history significant for chronic ITP who presents for a follow up visit.   After review of the prior records, discussion with the patient, and review the labs the patient's findings are most consistent with a chronic idiopathic thrombocytopenia that developed only during times of pregnancy.  She last had a successful pregnancy in 2014 at which time she was being followed by Dr. Beryle Beams.  At that time with no intervention her platelet counts dropped to 54,000 and she had a uneventful delivery with a rapid rebound just a few weeks later to 165,000.  Of note the patient did have a poor experience with prednisone therapy before and believes that the late term demise of her pregnancy in 2013 was secondary to steroid use.  Therefore she has requested that we avoid steroid therapy if at all possible.  I previously discussed with her that we would preferentially avoid  steroid therapy until absolutely necessary or just before the delivery.  She was open to the idea of IVIG therapy.  She notes that she has not had an epidural before and she does not wish to have one.  Therefore platelet target goal would be 50 K just before delivery.  Our treatment threshold would be 30 K at any point in her pregnancy.  On exam today Shelly Andrews is having some hypotension as well as drop in her platelet count.  Her hemoglobin has also dropped down to 10.5.  Fortunately her platelet count remains over 50,000 and therefore is still levels that would be safe for delivery.  At this time there is no intervention required for her platelet count.  I will continue every monthly lab checks until her planned delivery date of 11/04/2019.  Plan to see her approximately 3 months after delivery which would be in November 2021.  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 During her last pregnancy in 2014 she was followed by Dr. Beryle Beams. Counts dropped to 54k with rebound to 165k after delivery without intervention. --patient is currently [redacted] weeks pregnant  --today will order CBC, CMP, and peripheral blood film --extensive prior workup performed for thrombocytopenia [facility] at the [facility]. Bone marrow biopsy not performed due to the clear inciting factor for the findings --we will continue to measure her platelet counts through the duration of her pregnancy. No intervention required for Plt <30k. Prior to deliver the goal for the patient's platelets should be >50k. If epidural is to be performed the goal should be increased to >70k. The patient notes she has no required an epidural before and does not intend to have one at this time. --platelet checks q monthly for the next 2 months with f/u visit to be scheduled 3 months after the delivery.  --RTC in 5 months time for continued evaluation with  interval q 1 month lab checks prior to delivery.    #Splenomegaly, chronic --noted to be 15 cm on CT scan in 2014 --imaging showed enlarged spleen at 13 cm on 01/19/2012 and 12.5 cm on 10/11/2012. --not known to have liver disease --continue to monitor. No indication for splenectomy at this time, though with worsening ITP it could be considered.   #Hypotension --patient notes that she felt lightheaded, previously received IV fluids during this pregnancy --received 1000 ml of NS over 2 hours in clinic with some 20 meq of IV potassium added --orthostatic BP improved at time of release from clinic --continued f/u with Ob/Gyn.    No orders of the defined types were placed in this encounter.   All questions were answered. The patient knows to call the clinic with any problems, questions or concerns.  A total of more than 30 minutes were spent on this encounter and over half of that time was spent on counseling and coordination of care as outlined above.   Ledell Peoples, MD Department of Hematology/Oncology [place] at [facility] Phone: [phone] Pager: [phone] Email: [email or web address]  10/23/2019 4:44 PM

## 2019-10-23 ENCOUNTER — Encounter: Payer: Self-pay | Admitting: Hematology and Oncology

## 2019-10-23 ENCOUNTER — Other Ambulatory Visit: Payer: Self-pay

## 2019-10-23 ENCOUNTER — Ambulatory Visit: Payer: Medicaid Other | Admitting: Hematology and Oncology

## 2019-10-23 ENCOUNTER — Encounter: Payer: Self-pay | Admitting: *Deleted

## 2019-10-23 ENCOUNTER — Inpatient Hospital Stay: Payer: Medicaid Other | Attending: Hematology and Oncology

## 2019-10-23 ENCOUNTER — Inpatient Hospital Stay: Payer: Medicaid Other

## 2019-10-23 ENCOUNTER — Inpatient Hospital Stay (HOSPITAL_BASED_OUTPATIENT_CLINIC_OR_DEPARTMENT_OTHER): Payer: Medicaid Other | Admitting: Hematology and Oncology

## 2019-10-23 ENCOUNTER — Other Ambulatory Visit: Payer: Medicaid Other

## 2019-10-23 VITALS — BP 92/54 | HR 83 | Temp 97.5°F | Resp 16 | Wt 148.3 lb

## 2019-10-23 DIAGNOSIS — O99113 Other diseases of the blood and blood-forming organs and certain disorders involving the immune mechanism complicating pregnancy, third trimester: Secondary | ICD-10-CM | POA: Insufficient documentation

## 2019-10-23 DIAGNOSIS — I959 Hypotension, unspecified: Secondary | ICD-10-CM | POA: Insufficient documentation

## 2019-10-23 DIAGNOSIS — E860 Dehydration: Secondary | ICD-10-CM

## 2019-10-23 DIAGNOSIS — O0993 Supervision of high risk pregnancy, unspecified, third trimester: Secondary | ICD-10-CM

## 2019-10-23 DIAGNOSIS — D693 Immune thrombocytopenic purpura: Secondary | ICD-10-CM | POA: Diagnosis not present

## 2019-10-23 DIAGNOSIS — E876 Hypokalemia: Secondary | ICD-10-CM | POA: Diagnosis not present

## 2019-10-23 DIAGNOSIS — D696 Thrombocytopenia, unspecified: Secondary | ICD-10-CM

## 2019-10-23 LAB — CBC WITH DIFFERENTIAL (CANCER CENTER ONLY)
Abs Immature Granulocytes: 0.07 10*3/uL (ref 0.00–0.07)
Basophils Absolute: 0 10*3/uL (ref 0.0–0.1)
Basophils Relative: 0 %
Eosinophils Absolute: 0 10*3/uL (ref 0.0–0.5)
Eosinophils Relative: 1 %
HCT: 32 % — ABNORMAL LOW (ref 36.0–46.0)
Hemoglobin: 10.5 g/dL — ABNORMAL LOW (ref 12.0–15.0)
Immature Granulocytes: 1 %
Lymphocytes Relative: 25 %
Lymphs Abs: 1.4 10*3/uL (ref 0.7–4.0)
MCH: 31.4 pg (ref 26.0–34.0)
MCHC: 32.8 g/dL (ref 30.0–36.0)
MCV: 95.8 fL (ref 80.0–100.0)
Monocytes Absolute: 0.4 10*3/uL (ref 0.1–1.0)
Monocytes Relative: 7 %
Neutro Abs: 3.7 10*3/uL (ref 1.7–7.7)
Neutrophils Relative %: 66 %
Platelet Count: 86 10*3/uL — ABNORMAL LOW (ref 150–400)
RBC: 3.34 MIL/uL — ABNORMAL LOW (ref 3.87–5.11)
RDW: 13.2 % (ref 11.5–15.5)
WBC Count: 5.6 10*3/uL (ref 4.0–10.5)
nRBC: 0 % (ref 0.0–0.2)

## 2019-10-23 LAB — CMP (CANCER CENTER ONLY)
ALT: 12 U/L (ref 0–44)
AST: 16 U/L (ref 15–41)
Albumin: 2.5 g/dL — ABNORMAL LOW (ref 3.5–5.0)
Alkaline Phosphatase: 90 U/L (ref 38–126)
Anion gap: 10 (ref 5–15)
BUN: 5 mg/dL — ABNORMAL LOW (ref 6–20)
CO2: 21 mmol/L — ABNORMAL LOW (ref 22–32)
Calcium: 8.1 mg/dL — ABNORMAL LOW (ref 8.9–10.3)
Chloride: 108 mmol/L (ref 98–111)
Creatinine: 0.68 mg/dL (ref 0.44–1.00)
GFR, Est AFR Am: >60 mL/min (ref >60)
GFR, Estimated: >60 mL/min (ref >60)
Glucose, Bld: 142 mg/dL — ABNORMAL HIGH (ref 70–99)
Potassium: 3.2 mmol/L — ABNORMAL LOW (ref 3.5–5.1)
Sodium: 139 mmol/L (ref 135–145)
Total Bilirubin: 0.5 mg/dL (ref 0.3–1.2)
Total Protein: 5.5 g/dL — ABNORMAL LOW (ref 6.5–8.1)

## 2019-10-23 LAB — SAVE SMEAR(SSMR), FOR PROVIDER SLIDE REVIEW

## 2019-10-23 MED ORDER — POTASSIUM CHLORIDE 10 MEQ/100ML IV SOLN: 10.0000 meq | INTRAVENOUS | Status: DC

## 2019-10-23 MED ORDER — SODIUM CHLORIDE 0.9 % IV SOLN
INTRAVENOUS | Status: AC
  Filled 2019-10-23: qty 250

## 2019-10-23 MED ORDER — POTASSIUM CHLORIDE IN NACL 20-0.9 MEQ/L-% IV SOLN
Freq: Once | INTRAVENOUS | Status: AC
  Filled 2019-10-23: qty 1000

## 2019-10-23 NOTE — Patient Instructions (Signed)
Hypokalemia Hypokalemia means that the amount of potassium in the blood is lower than normal. Potassium is a chemical (electrolyte) that helps regulate the amount of fluid in the body. It also stimulates muscle tightening (contraction) and helps nerves work properly. Normally, most of the body's potassium is inside cells, and only a very small amount is in the blood. Because the amount in the blood is so small, minor changes to potassium levels in the blood can be life-threatening. What are the causes? This condition may be caused by:  Antibiotic medicine.  Diarrhea or vomiting. Taking too much of a medicine that helps you have a bowel movement (laxative) can cause diarrhea and lead to hypokalemia.  Chronic kidney disease (CKD).  Medicines that help the body get rid of excess fluid (diuretics).  Eating disorders, such as bulimia.  Low magnesium levels in the body.  Sweating a lot. What are the signs or symptoms? Symptoms of this condition include:  Weakness.  Constipation.  Fatigue.  Muscle cramps.  Mental confusion.  Skipped heartbeats or irregular heartbeat (palpitations).  Tingling or numbness. How is this diagnosed? This condition is diagnosed with a blood test. How is this treated? This condition may be treated by:  Taking potassium supplements by mouth.  Adjusting the medicines that you take.  Eating more foods that contain a lot of potassium. If your potassium level is very low, you may need to get potassium through an IV and be monitored in the hospital. Follow these instructions at home:   Take over-the-counter and prescription medicines only as told by your health care provider. This includes vitamins and supplements.  Eat a healthy diet. A healthy diet includes fresh fruits and vegetables, whole grains, healthy fats, and lean proteins.  If instructed, eat more foods that contain a lot of potassium. This includes: ? Nuts, such as peanuts and  pistachios. ? Seeds, such as sunflower seeds and pumpkin seeds. ? Peas, lentils, and lima beans. ? Whole grain and bran cereals and breads. ? Fresh fruits and vegetables, such as apricots, avocado, bananas, cantaloupe, kiwi, oranges, tomatoes, asparagus, and potatoes. ? Orange juice. ? Tomato juice. ? Red meats. ? Yogurt.  Keep all follow-up visits as told by your health care provider. This is important. Contact a health care provider if you:  Have weakness that gets worse.  Feel your heart pounding or racing.  Vomit.  Have diarrhea.  Have diabetes (diabetes mellitus) and you have trouble keeping your blood sugar (glucose) in your target range. Get help right away if you:  Have chest pain.  Have shortness of breath.  Have vomiting or diarrhea that lasts for more than 2 days.  Faint. Summary  Hypokalemia means that the amount of potassium in the blood is lower than normal.  This condition is diagnosed with a blood test.  Hypokalemia may be treated by taking potassium supplements, adjusting the medicines that you take, or eating more foods that are high in potassium.  If your potassium level is very low, you may need to get potassium through an IV and be monitored in the hospital. This information is not intended to replace advice given to you by your health care provider. Make sure you discuss any questions you have with your health care provider. Document Revised: 12/05/2017 Document Reviewed: 12/05/2017 Elsevier Patient Education  2020 Elsevier Inc.  Rehydration, Adult Rehydration is the replacement of body fluids and salts and minerals (electrolytes) that are lost during dehydration. Dehydration is when there is not enough fluid   or water in the body. This happens when you lose more fluids than you take in. Common causes of dehydration include:  Vomiting.  Diarrhea.  Excessive sweating, such as from heat exposure or exercise.  Taking medicines that cause the  body to lose excess fluid (diuretics).  Impaired kidney function.  Not drinking enough fluid.  Certain illnesses or infections.  Certain poorly controlled long-term (chronic) illnesses, such as diabetes, heart disease, and kidney disease.  Symptoms of mild dehydration may include thirst, dry lips and mouth, dry skin, and dizziness. Symptoms of severe dehydration may include increased heart rate, confusion, fainting, and not urinating. You can rehydrate by drinking certain fluids or getting fluids through an IV tube, as told by your health care provider. What are the risks? Generally, rehydration is safe. However, one problem that can happen is taking in too much fluid (overhydration). This is rare. If overhydration happens, it can cause an electrolyte imbalance, kidney failure, or a decrease in salt (sodium) levels in the body. How to rehydrate Follow instructions from your health care provider for rehydration. The kind of fluid you should drink and the amount you should drink depend on your condition.  If directed by your health care provider, drink an oral rehydration solution (ORS). This is a drink designed to treat dehydration that is found in pharmacies and retail stores. ? Make an ORS by following instructions on the package. ? Start by drinking small amounts, about  cup (120 mL) every 5-10 minutes. ? Slowly increase how much you drink until you have taken the amount recommended by your health care provider.  Drink enough clear fluids to keep your urine clear or pale yellow. If you were instructed to drink an ORS, finish the ORS first, then start slowly drinking other clear fluids. Drink fluids such as: ? Water. Do not drink only water. Doing that can lead to having too little sodium in your body (hyponatremia). ? Ice chips. ? Fruit juice that you have added water to (diluted juice). ? Low-calorie sports drinks.  If you are severely dehydrated, your health care provider may  recommend that you receive fluids through an IV tube in the hospital.  Do not take sodium tablets. Doing that can lead to the condition of having too much sodium in your body (hypernatremia). Eating while you rehydrate Follow instructions from your health care provider about what to eat while you rehydrate. Your health care provider may recommend that you slowly begin eating regular foods in small amounts.  Eat foods that contain a healthy balance of electrolytes, such as bananas, oranges, potatoes, tomatoes, and spinach.  Avoid foods that are greasy or contain a lot of fat or sugar.  In some cases, you may get nutrition through a feeding tube that is passed through your nose and into your stomach (nasogastric tube, or NG tube). This may be done if you have uncontrolled vomiting or diarrhea. Beverages to avoid Certain beverages may make dehydration worse. While you rehydrate, avoid:  Alcohol.  Caffeine.  Drinks that contain a lot of sugar. These include: ? High-calorie sports drinks. ? Fruit juice that is not diluted. ? Soda.  Check nutrition labels to see how much sugar or caffeine a beverage contains. Signs of dehydration recovery You may be recovering from dehydration if:  You are urinating more often than before you started rehydrating.  Your urine is clear or pale yellow.  Your energy level improves.  You vomit less frequently.  You have diarrhea less frequently.    Your appetite improves or returns to normal.  You feel less dizzy or less light-headed.  Your skin tone and color start to look more normal. Contact a health care provider if:  You continue to have symptoms of mild dehydration, such as: ? Thirst. ? Dry lips. ? Slightly dry mouth. ? Dry, warm skin. ? Dizziness.  You continue to vomit or have diarrhea. Get help right away if:  You have symptoms of dehydration that get worse.  You feel: ? Confused. ? Weak. ? Like you are going to faint.  You  have not urinated in 6-8 hours.  You have very dark urine.  You have trouble breathing.  Your heart rate while sitting still is over 100 beats a minute.  You cannot drink fluids without vomiting.  You have vomiting or diarrhea that: ? Gets worse. ? Does not go away.  You have a fever. This information is not intended to replace advice given to you by your health care provider. Make sure you discuss any questions you have with your health care provider. Document Revised: 04/06/2017 Document Reviewed: 06/18/2015 Elsevier Patient Education  2020 Elsevier Inc.  

## 2019-10-23 NOTE — Progress Notes (Signed)
Pt received IVF with potassium today, tolerated well.  VSS.  Able to eat/drink and ambulate to restroom during tx w/out any issues.  Orthostatic VS checked at time of d/c, negative for changes.  Md Leonides Schanz aware, advised pt to contact her OB as soon as possible regarding need for IVF today and low potassium levels.  Pt verbalized understanding of instructions, ambulatory w/steady gait to exit with belongings.

## 2019-10-24 ENCOUNTER — Other Ambulatory Visit: Payer: Self-pay

## 2019-10-24 ENCOUNTER — Encounter (HOSPITAL_COMMUNITY): Payer: Self-pay | Admitting: Obstetrics & Gynecology

## 2019-10-24 ENCOUNTER — Ambulatory Visit: Payer: Medicaid Other

## 2019-10-24 ENCOUNTER — Inpatient Hospital Stay (HOSPITAL_COMMUNITY)
Admission: AD | Admit: 2019-10-24 | Discharge: 2019-10-24 | Disposition: A | Discharge status: Home / Self Care | Payer: Medicaid Other | Attending: Obstetrics & Gynecology | Admitting: Obstetrics & Gynecology

## 2019-10-24 DIAGNOSIS — B9689 Other specified bacterial agents as the cause of diseases classified elsewhere: Secondary | ICD-10-CM | POA: Insufficient documentation

## 2019-10-24 DIAGNOSIS — Z8759 Personal history of other complications of pregnancy, childbirth and the puerperium: Secondary | ICD-10-CM | POA: Insufficient documentation

## 2019-10-24 DIAGNOSIS — Z3A29 29 weeks gestation of pregnancy: Secondary | ICD-10-CM | POA: Insufficient documentation

## 2019-10-24 DIAGNOSIS — O09293 Supervision of pregnancy with other poor reproductive or obstetric history, third trimester: Secondary | ICD-10-CM | POA: Diagnosis not present

## 2019-10-24 DIAGNOSIS — Z0371 Encounter for suspected problem with amniotic cavity and membrane ruled out: Secondary | ICD-10-CM | POA: Diagnosis present

## 2019-10-24 DIAGNOSIS — N760 Acute vaginitis: Secondary | ICD-10-CM

## 2019-10-24 DIAGNOSIS — O23593 Infection of other part of genital tract in pregnancy, third trimester: Secondary | ICD-10-CM | POA: Diagnosis not present

## 2019-10-24 DIAGNOSIS — Z79899 Other long term (current) drug therapy: Secondary | ICD-10-CM | POA: Diagnosis not present

## 2019-10-24 DIAGNOSIS — O99891 Other specified diseases and conditions complicating pregnancy: Secondary | ICD-10-CM

## 2019-10-24 LAB — URINALYSIS, ROUTINE W REFLEX MICROSCOPIC
Bilirubin Urine: NEGATIVE
Glucose, UA: NEGATIVE mg/dL
Hgb urine dipstick: NEGATIVE
Ketones, ur: NEGATIVE mg/dL
Nitrite: NEGATIVE
Protein, ur: NEGATIVE mg/dL
Specific Gravity, Urine: 1.012 (ref 1.005–1.030)
pH: 6 (ref 5.0–8.0)

## 2019-10-24 LAB — AMNISURE RUPTURE OF MEMBRANE (ROM) NOT AT ARMC: Amnisure ROM: NEGATIVE

## 2019-10-24 LAB — WET PREP, GENITAL
Sperm: NONE SEEN
Trich, Wet Prep: NONE SEEN
Yeast Wet Prep HPF POC: NONE SEEN

## 2019-10-24 MED ORDER — METRONIDAZOLE 500 MG PO TABS: 500.0000 mg | ORAL_TABLET | Freq: Two times a day (BID) | ORAL | 0 refills | Status: AC

## 2019-10-24 NOTE — MAU Provider Note (Signed)
History     CSN: [id number]  Arrival date and time: 10/24/19 1523   First Provider Initiated Contact with Patient 10/24/19 1621      Chief Complaint  Patient presents with  . Rupture of Membranes   [name] is a 39 y.o. year old [phone] female at [redacted]w[redacted]d weeks gestation who presents to MAU reporting leaking of fluid that started around 80 PM today.  She reports that her underwear got wet.  She did not wear a pad when she arrived in maternity assessment unit.  She denies any abdominal pain or contractions.  She reports good fetal movement today.  She receives her prenatal care at [facility] and was seen there this week.   OB History    Gravida  6   Para  4   Term  1   Preterm  3   AB  1   Living  3     SAB  0   TAB  0   Ectopic  1   Multiple  0   Live Births  3           Past Medical History:  Diagnosis Date  . Depression   . Splenomegaly 04/16/2013  . Thrombocytopenia (HCC)    Idopathic with pregnancy    Past Surgical History:  Procedure Laterality Date  . IUD REMOVAL  10/30/2018   Procedure: Intrauterine Device (Iud) Removal;  Surgeon: [name], MD;  Location: [facility] OR;  Service: Gynecology;;  . LAPAROSCOPIC UNILATERAL SALPINGO OOPHERECTOMY Right 10/30/2018   Procedure: LAPAROSCOPIC RIGHT SALPINGECTOMY AND REMOVAL OF ECTOPIC PREGNANCY;  Surgeon: [name], MD;  Location: [facility] OR;  Service: Gynecology;  Laterality: Right;  . WISDOM TOOTH EXTRACTION      Family History  Problem Relation Age of Onset  . Diabetes Mother   . Diabetes Maternal Grandmother     Social History   Tobacco Use  . Smoking status: Never Smoker  . Smokeless tobacco: Never Used  Vaping Use  . Vaping Use: Never used  Substance Use Topics  . Alcohol use: Not Currently    Comment: 1-2 on weekend  . Drug use: No    Allergies:  Allergies  Allergen Reactions  . Zoloft [Sertraline Hcl] Hives  . Sulfa Antibiotics Hives    Medications Prior to  Admission  Medication Sig Dispense Refill Last Dose  . acetaminophen (TYLENOL) 500 MG tablet Take 1,000 mg by mouth every 6 (six) hours as needed for mild pain or headache.      . Docosahexaenoic Acid (PRENATAL DHA) 200 MG CAPS Prenatal + DHA     . pantoprazole (PROTONIX) 40 MG tablet Take 1 tablet (40 mg total) by mouth daily. 30 tablet 0     Review of Systems  Constitutional: Negative.   HENT: Negative.   Eyes: Negative.   Respiratory: Negative.   Cardiovascular: Negative.   Gastrointestinal: Negative.   Endocrine: Negative.   Genitourinary: Positive for vaginal discharge ("wet underwear at noon").  Musculoskeletal: Negative.   Skin: Negative.   Allergic/Immunologic: Negative.   Neurological: Negative.   Hematological: Negative.   Psychiatric/Behavioral: Negative.    Physical Exam   Blood pressure 112/73, pulse 98, temperature 98.5 F (36.9 C), resp. rate 16, last menstrual period 04/03/2019, unknown if currently breastfeeding.  Physical Exam  Nursing note and vitals reviewed. HENT:  Head: Normocephalic and atraumatic.  Eyes: Pupils are equal, round, and reactive to light.  Cardiovascular: Normal rate and normal pulses.  Respiratory: Effort normal.  GI: Soft. Normal appearance.  Genitourinary:    Genitourinary Comments: Uterus: gravid, S=D, SSE: cervix is smooth, pink, no lesions, small amt of thin, bubbly, white vaginal d/c -- WP, fern and Amnisure done, closed/long/soft, no CMT or friability, no adnexal tenderness    Musculoskeletal:     Cervical back: Normal range of motion.  Neurological: She is alert.    MAU Course  Procedures  MDM CCUA Wet Prep Fern Slide -- Negative Amnisure  Results for orders placed or performed during the hospital encounter of 10/24/19 (from the past 24 hour(s))  Urinalysis, Routine w reflex microscopic     Status: Abnormal   Collection Time: 10/24/19  4:03 PM  Result Value Ref Range   Color, Urine YELLOW YELLOW   APPearance HAZY  (A) CLEAR   Specific Gravity, Urine 1.012 1.005 - 1.030   pH 6.0 5.0 - 8.0   Glucose, UA NEGATIVE NEGATIVE mg/dL   Hgb urine dipstick NEGATIVE NEGATIVE   Bilirubin Urine NEGATIVE NEGATIVE   Ketones, ur NEGATIVE NEGATIVE mg/dL   Protein, ur NEGATIVE NEGATIVE mg/dL   Nitrite NEGATIVE NEGATIVE   Leukocytes,Ua TRACE (A) NEGATIVE   RBC / HPF 0-5 0 - 5 RBC/hpf   WBC, UA 6-10 0 - 5 WBC/hpf   Bacteria, UA RARE (A) NONE SEEN   Squamous Epithelial / LPF 0-5 0 - 5   Mucus PRESENT   Wet prep, genital     Status: Abnormal   Collection Time: 10/24/19  4:22 PM   Specimen: Vaginal  Result Value Ref Range   Yeast Wet Prep HPF POC NONE SEEN NONE SEEN   Trich, Wet Prep NONE SEEN NONE SEEN   Clue Cells Wet Prep HPF POC PRESENT (A) NONE SEEN   WBC, Wet Prep HPF POC MODERATE (A) NONE SEEN   Sperm NONE SEEN   Amnisure rupture of membrane (rom)not at [facility]     Status: None   Collection Time: 10/24/19  4:22 PM  Result Value Ref Range   Amnisure ROM NEGATIVE     Assessment and Plan  No leakage of amniotic fluid into vagina  - Reassurance given that watery discharge is not from leaking of amniotic fluid  Bacterial vaginosis - Information provided on BV - Rx for Flagyl 500 mg BID x 7 days   - Discharge patient - Keep scheduled appt P4W - Patient verbalized an understanding of the plan of care and agrees.   Laury Deep, MSN, CNM 10/24/2019, 4:52 PM

## 2019-10-24 NOTE — MAU Note (Addendum)
.   Shelly Andrews is a 39 y.o. at [redacted]w[redacted]d here in MAU reporting: leakage of fluid around 12 today. States that she does not have to wear a pad a present. Denies pain  Onset of complaint: 12 today Pain score: 0 Vitals:   10/24/19 1545  BP: 112/73  Pulse: 98  Resp: 16  Temp: 98.5 F (36.9 C)     FHT:145 Lab orders placed from triage: UA

## 2019-10-29 ENCOUNTER — Telehealth: Payer: Self-pay | Admitting: Hematology and Oncology

## 2019-10-29 NOTE — Telephone Encounter (Signed)
Scheduled per los. Called and left msg. Mailed printout  °

## 2019-11-04 DIAGNOSIS — Z3A31 31 weeks gestation of pregnancy: Secondary | ICD-10-CM | POA: Diagnosis not present

## 2019-11-04 DIAGNOSIS — O26893 Other specified pregnancy related conditions, third trimester: Secondary | ICD-10-CM | POA: Diagnosis not present

## 2019-11-06 DIAGNOSIS — Z419 Encounter for procedure for purposes other than remedying health state, unspecified: Secondary | ICD-10-CM | POA: Diagnosis not present

## 2019-11-13 ENCOUNTER — Other Ambulatory Visit: Payer: Self-pay

## 2019-11-13 ENCOUNTER — Inpatient Hospital Stay (HOSPITAL_COMMUNITY)
Admission: AD | Admit: 2019-11-13 | Discharge: 2019-11-15 | DRG: 833 | Disposition: A | Discharge status: Home / Self Care | Payer: Medicaid Other | Attending: Obstetrics and Gynecology | Admitting: Obstetrics and Gynecology

## 2019-11-13 ENCOUNTER — Encounter (HOSPITAL_COMMUNITY): Payer: Self-pay | Admitting: Obstetrics & Gynecology

## 2019-11-13 DIAGNOSIS — O4700 False labor before 37 completed weeks of gestation, unspecified trimester: Secondary | ICD-10-CM | POA: Diagnosis present

## 2019-11-13 DIAGNOSIS — O09213 Supervision of pregnancy with history of pre-term labor, third trimester: Secondary | ICD-10-CM | POA: Diagnosis not present

## 2019-11-13 DIAGNOSIS — Z20822 Contact with and (suspected) exposure to covid-19: Secondary | ICD-10-CM | POA: Diagnosis present

## 2019-11-13 DIAGNOSIS — Z3A32 32 weeks gestation of pregnancy: Secondary | ICD-10-CM | POA: Diagnosis not present

## 2019-11-13 DIAGNOSIS — O09523 Supervision of elderly multigravida, third trimester: Secondary | ICD-10-CM

## 2019-11-13 LAB — URINALYSIS, ROUTINE W REFLEX MICROSCOPIC
Bilirubin Urine: NEGATIVE
Glucose, UA: NEGATIVE mg/dL
Hgb urine dipstick: NEGATIVE
Ketones, ur: 5 mg/dL — AB
Nitrite: NEGATIVE
Protein, ur: NEGATIVE mg/dL
Specific Gravity, Urine: 1.005 (ref 1.005–1.030)
pH: 6 (ref 5.0–8.0)

## 2019-11-13 MED ORDER — NIFEDIPINE 10 MG PO CAPS
10.0000 mg | ORAL_CAPSULE | ORAL | Status: AC | PRN
  Administered 2019-11-13 (×4): 10 mg via ORAL
  Filled 2019-11-13 (×4): qty 1

## 2019-11-13 MED ORDER — BETAMETHASONE SOD PHOS & ACET 6 (3-3) MG/ML IJ SUSP
12.0000 mg | Freq: Once | INTRAMUSCULAR | Status: AC
  Administered 2019-11-14: 12 mg via INTRAMUSCULAR
  Filled 2019-11-13: qty 5

## 2019-11-13 NOTE — MAU Provider Note (Signed)
Chief Complaint:  Contractions   First Provider Initiated Contact with Patient 11/13/19 2137     HPI: Shelly Andrews is a 39 y.o. [id number] at 65w4dwho presents to maternity admissions reporting uterine contractions for a day or so .  Never has felt much pain with any of her labors.  The ones today are somewhat more painful than before.  History of Preterm Births. . She reports good fetal movement, denies LOF, vaginal bleeding, vaginal itching/burning, urinary symptoms, h/a, dizziness, n/v, diarrhea, constipation or fever/chills.   Abdominal Pain This is a new problem. The current episode started yesterday. The onset quality is gradual. The problem occurs intermittently. The problem has been unchanged. The quality of the pain is cramping. The abdominal pain does not radiate. Pertinent negatives include no constipation, diarrhea, dysuria, fever, frequency, myalgias, nausea or vomiting. Nothing aggravates the pain. The pain is relieved by nothing. She has tried nothing for the symptoms.    RN Note: Shelly Andrews is a 39 y.o. at [redacted]w[redacted]d here in MAU reporting: contractions that started getting worse today, has a history of pre-term labor and delivery for last child. Denies VB/LOF       +FM                 Pain score: 6  Past Medical History: Past Medical History:  Diagnosis Date   Depression    Splenomegaly 04/16/2013   Thrombocytopenia (HCC)    Idopathic with pregnancy    Past obstetric history: OB History  Gravida Para Term Preterm AB Living  6 4 1 3 1 3   SAB TAB Ectopic Multiple Live Births  0 0 1 0 3    # Outcome Date GA Lbr Len/2nd Weight Sex Delivery Anes PTL Lv  6 Current           5 Ectopic 10/2018          4 Preterm 03/01/13 [redacted]w[redacted]d 01:59 / 00:08 3190 g M Vag-Spont Local  LIV  3 Preterm 12/30/11 [redacted]w[redacted]d -06:06 / 00:16 2820 g M Vag-Spont None  FD  2 Preterm  [redacted]w[redacted]d    Vag-Spont   LIV  1 Term      Vag-Spont   LIV    Past Surgical History: Past Surgical History:  Procedure  Laterality Date   IUD REMOVAL  10/30/2018   Procedure: Intrauterine Device (Iud) Removal;  Surgeon: 11/01/2018, MD;  Location: [facility] OR;  Service: Gynecology;;   LAPAROSCOPIC UNILATERAL SALPINGO OOPHERECTOMY Right 10/30/2018   Procedure: LAPAROSCOPIC RIGHT SALPINGECTOMY AND REMOVAL OF ECTOPIC PREGNANCY;  Surgeon: 11/01/2018, MD;  Location: [facility] OR;  Service: Gynecology;  Laterality: Right;   WISDOM TOOTH EXTRACTION      Family History: Family History  Problem Relation Age of Onset   Diabetes Mother    Diabetes Maternal Grandmother     Social History: Social History   Tobacco Use   Smoking status: Never Smoker   Smokeless tobacco: Never Used  [name] Use: Never used  Substance Use Topics   Alcohol use: Not Currently    Comment: 1-2 on weekend   Drug use: No    Allergies:  Allergies  Allergen Reactions   Zoloft [Sertraline Hcl] Hives   Sulfa Antibiotics Hives    Meds:  Medications Prior to Admission  Medication Sig Dispense Refill Last Dose   acetaminophen (TYLENOL) 500 MG tablet Take 1,000 mg by mouth every 6 (six) hours as needed for mild pain or headache.    11/12/2019  at 2230   Docosahexaenoic Acid (PRENATAL DHA) 200 MG CAPS Prenatal + DHA   11/13/2019 at 0830   pantoprazole (PROTONIX) 40 MG tablet Take 1 tablet (40 mg total) by mouth daily. 30 tablet 0 11/13/2019 at 0830    I have reviewed patient's Past Medical Hx, Surgical Hx, Family Hx, Social Hx, medications and allergies.   ROS:  Review of Systems  Constitutional: Negative for fever.  Gastrointestinal: Positive for abdominal pain. Negative for constipation, diarrhea, nausea and vomiting.  Genitourinary: Negative for dysuria and frequency.  Musculoskeletal: Negative for myalgias.   Other systems negative  Physical Exam   Patient Vitals for the past 24 hrs:  BP Temp Temp src Pulse Resp SpO2  11/13/19 2125 109/66 98.7 F (37.1 C) Oral (!) 107 18 98 %   Constitutional:  Well-developed, well-nourished female in no acute distress.  Cardiovascular: normal rate and rhythm Respiratory: normal effort, clear to auscultation bilaterally GI: Abd soft, non-tender, gravid appropriate for gestational age.   No rebound or guarding. MS: Extremities nontender, no edema, normal ROM Neurologic: Alert and oriented x 4.  GU: Neg CVAT.  PELVIC EXAM:   Dilation: Fingertip Effacement (%): 50 Station: Ballotable Exam by:: Wynelle Bourgeois, CNM  FHT:  Baseline 145 , moderate variability, accelerations present, no decelerations Contractions: q 3-4 mins Irregular     Labs: Got examined today so could not sent fetal fibronectin.     Results for orders placed or performed during the hospital encounter of 11/13/19 (from the past 24 hour(s))  Urinalysis, Routine w reflex microscopic     Status: Abnormal   Collection Time: 11/13/19  9:34 PM  Result Value Ref Range   Color, Urine YELLOW YELLOW   APPearance HAZY (A) CLEAR   Specific Gravity, Urine 1.005 1.005 - 1.030   pH 6.0 5.0 - 8.0   Glucose, UA NEGATIVE NEGATIVE mg/dL   Hgb urine dipstick NEGATIVE NEGATIVE   Bilirubin Urine NEGATIVE NEGATIVE   Ketones, ur 5 (A) NEGATIVE mg/dL   Protein, ur NEGATIVE NEGATIVE mg/dL   Nitrite NEGATIVE NEGATIVE   Leukocytes,Ua LARGE (A) NEGATIVE   RBC / HPF 11-20 0 - 5 RBC/hpf   WBC, UA 11-20 0 - 5 WBC/hpf   Bacteria, UA RARE (A) NONE SEEN   Squamous Epithelial / LPF 11-20 0 - 5     Imaging:  No results found.  MAU Course/MDM: I have ordered labs and reviewed results. Urine is dilute but has large leukocytes with 11-20 SqEp (likely contaminated).  Sent to culture but probably would not treat.  NST reviewed, reactive Procardia series given with some reduction of UCs but patient states still feels them.  She feels she wants to stay overnight for observation.   Consult Dr Macon Large and Dr Langston Masker with presentation, exam findings and test results.   Dr Langston Masker wants to try IV hydration and  if patient is not feeling better, will observe overnight.  Cleared by Neonatology if we need to keep her Treatments in MAU included Procardia series and IVHydration.   Continued to feel contractions after IV hydration, requested to be observed overnight due to history.  Dr Langston Masker updated. Will admit overnight for observation  Assessment: Single IUP at [redacted]w[redacted]d Preterm uterine contractions History of preterm delivery  Plan: Admit for observation Continuous EFM PTL precautions MD to follow   Wynelle Bourgeois CNM, MSN Certified Nurse-Midwife 11/13/2019 9:37 PM

## 2019-11-13 NOTE — MAU Note (Signed)
.  Shelly Andrews is a 39 y.o. at [redacted]w[redacted]d here in MAU reporting: contractions that started getting worse today, has a history of pre-term labor and delivery for last child. Denies VB/LOF  +FM Pain score: 6    Lab orders placed from triage:  U/A

## 2019-11-14 ENCOUNTER — Encounter (HOSPITAL_COMMUNITY): Payer: Self-pay | Admitting: Obstetrics & Gynecology

## 2019-11-14 DIAGNOSIS — O4703 False labor before 37 completed weeks of gestation, third trimester: Secondary | ICD-10-CM | POA: Diagnosis not present

## 2019-11-14 DIAGNOSIS — Z3A32 32 weeks gestation of pregnancy: Secondary | ICD-10-CM | POA: Diagnosis not present

## 2019-11-14 DIAGNOSIS — O09213 Supervision of pregnancy with history of pre-term labor, third trimester: Secondary | ICD-10-CM | POA: Diagnosis not present

## 2019-11-14 DIAGNOSIS — Z3A38 38 weeks gestation of pregnancy: Secondary | ICD-10-CM

## 2019-11-14 DIAGNOSIS — O09523 Supervision of elderly multigravida, third trimester: Secondary | ICD-10-CM | POA: Diagnosis not present

## 2019-11-14 DIAGNOSIS — O4700 False labor before 37 completed weeks of gestation, unspecified trimester: Secondary | ICD-10-CM | POA: Diagnosis present

## 2019-11-14 DIAGNOSIS — Z20822 Contact with and (suspected) exposure to covid-19: Secondary | ICD-10-CM | POA: Diagnosis not present

## 2019-11-14 LAB — CBC WITH DIFFERENTIAL/PLATELET
Abs Immature Granulocytes: 0.19 10*3/uL — ABNORMAL HIGH (ref 0.00–0.07)
Basophils Absolute: 0 10*3/uL (ref 0.0–0.1)
Basophils Relative: 0 %
Eosinophils Absolute: 0 10*3/uL (ref 0.0–0.5)
Eosinophils Relative: 0 %
HCT: 30.8 % — ABNORMAL LOW (ref 36.0–46.0)
Hemoglobin: 10.1 g/dL — ABNORMAL LOW (ref 12.0–15.0)
Immature Granulocytes: 3 %
Lymphocytes Relative: 10 %
Lymphs Abs: 0.6 10*3/uL — ABNORMAL LOW (ref 0.7–4.0)
MCH: 31.4 pg (ref 26.0–34.0)
MCHC: 32.8 g/dL (ref 30.0–36.0)
MCV: 95.7 fL (ref 80.0–100.0)
Monocytes Absolute: 0.2 10*3/uL (ref 0.1–1.0)
Monocytes Relative: 3 %
Neutro Abs: 5.4 10*3/uL (ref 1.7–7.7)
Neutrophils Relative %: 84 %
Platelets: DECREASED 10*3/uL (ref 150–400)
RBC: 3.22 MIL/uL — ABNORMAL LOW (ref 3.87–5.11)
RDW: 13.1 % (ref 11.5–15.5)
WBC: 6.4 10*3/uL (ref 4.0–10.5)
nRBC: 0 % (ref 0.0–0.2)

## 2019-11-14 LAB — SARS CORONAVIRUS 2 BY RT PCR (HOSPITAL ORDER, PERFORMED IN ~~LOC~~ HOSPITAL LAB): SARS Coronavirus 2: NEGATIVE

## 2019-11-14 LAB — TYPE AND SCREEN
ABO/RH(D): O NEG
Antibody Screen: POSITIVE

## 2019-11-14 MED ORDER — ACETAMINOPHEN 325 MG PO TABS
650.0000 mg | ORAL_TABLET | ORAL | Status: DC | PRN
  Administered 2019-11-15: 650 mg via ORAL
  Filled 2019-11-14: qty 2

## 2019-11-14 MED ORDER — ACETAMINOPHEN 325 MG PO TABS
650.0000 mg | ORAL_TABLET | Freq: Once | ORAL | Status: AC
  Administered 2019-11-14: 650 mg via ORAL
  Filled 2019-11-14: qty 2

## 2019-11-14 MED ORDER — DOCUSATE SODIUM 100 MG PO CAPS
100.0000 mg | ORAL_CAPSULE | Freq: Every day | ORAL | Status: DC
  Administered 2019-11-14: 100 mg via ORAL
  Filled 2019-11-14: qty 1

## 2019-11-14 MED ORDER — ZOLPIDEM TARTRATE 5 MG PO TABS: 5.0000 mg | ORAL_TABLET | Freq: Every evening | ORAL | Status: DC | PRN

## 2019-11-14 MED ORDER — LACTATED RINGERS IV BOLUS
1000.0000 mL | Freq: Once | INTRAVENOUS | Status: AC
  Administered 2019-11-14: 1000 mL via INTRAVENOUS

## 2019-11-14 MED ORDER — PRENATAL MULTIVITAMIN CH
1.0000 | ORAL_TABLET | Freq: Every day | ORAL | Status: DC
  Administered 2019-11-14: 1 via ORAL
  Filled 2019-11-14: qty 1

## 2019-11-14 MED ORDER — CALCIUM CARBONATE ANTACID 500 MG PO CHEW: 2.0000 | CHEWABLE_TABLET | ORAL | Status: DC | PRN

## 2019-11-14 MED ORDER — BETAMETHASONE SOD PHOS & ACET 6 (3-3) MG/ML IJ SUSP
12.0000 mg | Freq: Once | INTRAMUSCULAR | Status: AC
  Administered 2019-11-15: 12 mg via INTRAMUSCULAR

## 2019-11-14 NOTE — MAU Note (Signed)
covid swab obtained without difficulty and pt tol well. No symptoms °

## 2019-11-14 NOTE — Plan of Care (Signed)
  Problem: Education: Goal: Knowledge of disease or condition will improve Outcome: Progressing Goal: Knowledge of the prescribed therapeutic regimen will improve 11/14/2019 0441 by Bobbye Morton, RN Outcome: Progressing 11/14/2019 0441 by Bobbye Morton, RN Outcome: Progressing Goal: Individualized Educational Video(s) Outcome: Progressing   Problem: Clinical Measurements: Goal: Complications related to the disease process, condition or treatment will be avoided or minimized Outcome: Progressing

## 2019-11-14 NOTE — H&P (Addendum)
Chief Complaint:  Contractions   First Provider Initiated Contact with Patient 11/13/19 2137     HPI: Shelly Andrews is a 39 y.o. [id number] at 25w4dwho presents to maternity admissions reporting uterine contractions for a day or so .  Never has felt much pain with any of her labors.  The ones today are somewhat more painful than before.  History of Preterm Births. . She reports good fetal movement, denies LOF, vaginal bleeding, vaginal itching/burning, urinary symptoms, h/a, dizziness, n/v, diarrhea, constipation or fever/chills.   Abdominal Pain This is a new problem. The current episode started yesterday. The onset quality is gradual. The problem occurs intermittently. The problem has been unchanged. The quality of the pain is cramping. The abdominal pain does not radiate. Pertinent negatives include no constipation, diarrhea, dysuria, fever, frequency, myalgias, nausea or vomiting. Nothing aggravates the pain. The pain is relieved by nothing. She has tried nothing for the symptoms.    RN Note: Shelly Andrews is a 39 y.o. at [redacted]w[redacted]d here in MAU reporting: contractions that started getting worse today, has a history of pre-term labor and delivery for last child. Denies VB/LOF       +FM                 Pain score: 6  Past Medical History: Past Medical History:  Diagnosis Date  . Depression   . Splenomegaly 04/16/2013  . Thrombocytopenia (HCC)    Idopathic with pregnancy    Past obstetric history: OB History  Gravida Para Term Preterm AB Living  6 4 1 3 1 3   SAB TAB Ectopic Multiple Live Births  0 0 1 0 3    # Outcome Date GA Lbr Len/2nd Weight Sex Delivery Anes PTL Lv  6 Current           5 Ectopic 10/2018          4 Preterm 03/01/13 [redacted]w[redacted]d 01:59 / 00:08 3190 g M Vag-Spont Local  LIV  3 Preterm 12/30/11 [redacted]w[redacted]d -06:06 / 00:16 2820 g M Vag-Spont None  FD  2 Preterm  [redacted]w[redacted]d    Vag-Spont   LIV  1 Term      Vag-Spont   LIV    Past Surgical History: Past Surgical History:  Procedure  Laterality Date  . IUD REMOVAL  10/30/2018   Procedure: Intrauterine Device (Iud) Removal;  Surgeon: 11/01/2018, MD;  Location: [facility] OR;  Service: Gynecology;;  . LAPAROSCOPIC UNILATERAL SALPINGO OOPHERECTOMY Right 10/30/2018   Procedure: LAPAROSCOPIC RIGHT SALPINGECTOMY AND REMOVAL OF ECTOPIC PREGNANCY;  Surgeon: 11/01/2018, MD;  Location: [facility] OR;  Service: Gynecology;  Laterality: Right;  . WISDOM TOOTH EXTRACTION      Family History: Family History  Problem Relation Age of Onset  . Diabetes Mother   . Diabetes Maternal Grandmother     Social History: Social History   Tobacco Use  . Smoking status: Never Smoker  . Smokeless tobacco: Never Used  Vaping Use  . Vaping Use: Never used  Substance Use Topics  . Alcohol use: Not Currently    Comment: 1-2 on weekend  . Drug use: No    Allergies:  Allergies  Allergen Reactions  . Zoloft [Sertraline Hcl] Hives  . Sulfa Antibiotics Hives    Meds:  Medications Prior to Admission  Medication Sig Dispense Refill Last Dose  . acetaminophen (TYLENOL) 500 MG tablet Take 1,000 mg by mouth every 6 (six) hours as needed for mild pain or headache.    11/12/2019  at 2230  . Docosahexaenoic Acid (PRENATAL DHA) 200 MG CAPS Prenatal + DHA   11/13/2019 at 0830  . pantoprazole (PROTONIX) 40 MG tablet Take 1 tablet (40 mg total) by mouth daily. 30 tablet 0 11/13/2019 at 0830    I have reviewed patient's Past Medical Hx, Surgical Hx, Family Hx, Social Hx, medications and allergies.   ROS:  Review of Systems  Constitutional: Negative for fever.  Gastrointestinal: Positive for abdominal pain. Negative for constipation, diarrhea, nausea and vomiting.  Genitourinary: Negative for dysuria and frequency.  Musculoskeletal: Negative for myalgias.   Other systems negative  Physical Exam   Patient Vitals for the past 24 hrs:  BP Temp Temp src Pulse Resp SpO2  11/13/19 2125 109/66 98.7 F (37.1 C) Oral (!) 107 18 98 %   Constitutional:  Well-developed, well-nourished female in no acute distress.  Cardiovascular: normal rate and rhythm Respiratory: normal effort, clear to auscultation bilaterally GI: Abd soft, non-tender, gravid appropriate for gestational age.   No rebound or guarding. MS: Extremities nontender, no edema, normal ROM Neurologic: Alert and oriented x 4.  GU: Neg CVAT.  PELVIC EXAM:   Dilation: Fingertip Effacement (%): 50 Station: Ballotable Exam by:: Wynelle Bourgeois, CNM  FHT:  Baseline 145 , moderate variability, accelerations present, no decelerations Contractions: q 3-4 mins Irregular     Labs: Got examined today so could not sent fetal fibronectin.     Results for orders placed or performed during the hospital encounter of 11/13/19 (from the past 24 hour(s))  Urinalysis, Routine w reflex microscopic     Status: Abnormal   Collection Time: 11/13/19  9:34 PM  Result Value Ref Range   Color, Urine YELLOW YELLOW   APPearance HAZY (A) CLEAR   Specific Gravity, Urine 1.005 1.005 - 1.030   pH 6.0 5.0 - 8.0   Glucose, UA NEGATIVE NEGATIVE mg/dL   Hgb urine dipstick NEGATIVE NEGATIVE   Bilirubin Urine NEGATIVE NEGATIVE   Ketones, ur 5 (A) NEGATIVE mg/dL   Protein, ur NEGATIVE NEGATIVE mg/dL   Nitrite NEGATIVE NEGATIVE   Leukocytes,Ua LARGE (A) NEGATIVE   RBC / HPF 11-20 0 - 5 RBC/hpf   WBC, UA 11-20 0 - 5 WBC/hpf   Bacteria, UA RARE (A) NONE SEEN   Squamous Epithelial / LPF 11-20 0 - 5     Imaging:  No results found.  MAU Course/MDM: I have ordered labs and reviewed results. Urine is dilute but has large leukocytes with 11-20 SqEp (likely contaminated).  Sent to culture but probably would not treat.  NST reviewed, reactive Procardia series given with some reduction of UCs but patient states still feels them.  She feels she wants to stay overnight for observation.   Consult Dr Macon Large and Dr Langston Masker with presentation, exam findings and test results.   Dr Langston Masker wants to try IV hydration and  if patient is not feeling better, will observe overnight.  Cleared by Neonatology if we need to keep her Treatments in MAU included Procardia series and IVHydration.   Continued to feel contractions after IV hydration, requested to be observed overnight due to history.  Dr Langston Masker updated. Will admit overnight for observation  Assessment: Single IUP at [redacted]w[redacted]d Preterm uterine contractions History of preterm delivery  Plan: Admit for observation Continuous EFM PTL precautions MD to follow   Wynelle Bourgeois CNM, MSN Certified Nurse-Midwife  Agree with above assessment and plan.    Mitchel Honour, DO

## 2019-11-14 NOTE — Progress Notes (Signed)
This morning, patient continues to feel CTX although spaced out.   SVE: FT/50/-3 Will CTM with CEFM and TOCO. If stable, consider D/C after second BMZ given CBC, T&S pending.  Mitchel Honour, DO

## 2019-11-14 NOTE — Progress Notes (Signed)
S: contractions continue to space out.  O:  Vitals:   11/14/19 0312 11/14/19 0759  BP: (!) 96/53 (!) 93/58  Pulse: 94 93  Resp: 20 18  Temp: 99.1 F (37.3 C) 98.1 F (36.7 C)  SpO2: 100% 98%   Gen: NAD, comf lying in bed Pulm: NWOB CV: Reg rate Abd: soft, nondistended, gravid SVE deferred - Dr. Langston Masker checked patient two hours ago   39 yo [id number] admitted at 32.4 wga now 32.5 wga (HD#2) with PTCs.  - procardia - cefm - s/p BMZ#1, #2 due tonight around midnight - anticipate d/c tomorrow AM after BMZ #2 - Plan d/w pt  Rosie Fate MD

## 2019-11-15 ENCOUNTER — Encounter (HOSPITAL_COMMUNITY): Payer: Self-pay | Admitting: Obstetrics & Gynecology

## 2019-11-15 LAB — CULTURE, OB URINE: Culture: >=100000 — AB

## 2019-11-15 LAB — CULTURE, BETA STREP (GROUP B ONLY)

## 2019-11-15 NOTE — Discharge Summary (Signed)
Physician Discharge Summary  Patient ID: Shelly Andrews MRN: [id number] DOB/AGE: 1981/03/29 39 y.o.  Admit date: 11/13/2019 Discharge date: 11/15/2019  Admission Diagnoses: Preterm contractions  Discharge Diagnoses:  Active Problems:   Preterm uterine contractions   Discharged Condition: good  Hospital Course: 39 yo [id number] admitted at 1.4 wga with PTCs. She was given nifedipine and contractions improved. She received a full course of BMZ. Her cervix remained unchanged the entire hospitalization. Of day of discharge, her cervix was ftp and long.   Consults: None  Significant Diagnostic Studies: labs:  CBC    Component Value Date/Time   WBC 6.4 11/14/2019 0727   RBC 3.22 (L) 11/14/2019 0727   HGB 10.1 (L) 11/14/2019 0727   HGB 10.5 (L) 10/23/2019 0824   HGB 12.8 06/12/2013 1042   HCT 30.8 (L) 11/14/2019 0727   HCT 38.2 06/12/2013 1042   PLT  11/14/2019 0727    PLATELET CLUMPS NOTED ON SMEAR, COUNT APPEARS DECREASED   PLT 86 (L) 10/23/2019 0824   PLT 169 06/12/2013 1042   MCV 95.7 11/14/2019 0727   MCV 89.5 06/12/2013 1042   MCH 31.4 11/14/2019 0727   MCHC 32.8 11/14/2019 0727   RDW 13.1 11/14/2019 0727   RDW 13.7 06/12/2013 1042   LYMPHSABS 0.6 (L) 11/14/2019 0727   LYMPHSABS 1.5 06/12/2013 1042   MONOABS 0.2 11/14/2019 0727   MONOABS 0.4 06/12/2013 1042   EOSABS 0.0 11/14/2019 0727   EOSABS 0.1 06/12/2013 1042   BASOSABS 0.0 11/14/2019 0727   BASOSABS 0.0 06/12/2013 1042    Treatments: BMZ  Discharge Exam: Blood pressure (!) 97/58, pulse 95, temperature 98.5 F (36.9 C), temperature source Oral, resp. rate 17, height 5\' 3"  (1.6 m), weight 68 kg, last menstrual period 04/03/2019, SpO2 99 %, unknown if currently breastfeeding. General appearance: alert and cooperative Resp: NWOB Cardio: regular rate and rhythm GI: soft, non-tender; bowel sounds normal; no masses,  no organomegaly and gravid Extremities: extremities normal, atraumatic, no cyanosis or  edema  Disposition: Discharge disposition: 01-Home or Self Care       Discharge Instructions    Discharge activity:  No Restrictions   Complete by: As directed    Discharge diet:  No restrictions   Complete by: As directed    Do not have sex or do anything that might make you have an orgasm   Complete by: As directed    Fetal Kick Count:  Lie on our left side for one hour after a meal, and count the number of times your baby kicks.  If it is less than 5 times, get up, move around and drink some juice.  Repeat the test 30 minutes later.  If it is still less than 5 kicks in an hour, notify your doctor.   Complete by: As directed    Notify physician for a general feeling that "something is not right"   Complete by: As directed    Notify physician for increase or change in vaginal discharge   Complete by: As directed    Notify physician for intestinal cramps, with or without diarrhea, sometimes described as "gas pain"   Complete by: As directed    Notify physician for leaking of fluid   Complete by: As directed    Notify physician for low, dull backache, unrelieved by heat or Tylenol   Complete by: As directed    Notify physician for menstrual like cramps   Complete by: As directed    Notify physician for pelvic pressure  Complete by: As directed    Notify physician for uterine contractions.  These may be painless and feel like the uterus is tightening or the baby is  "balling up"   Complete by: As directed    Notify physician for vaginal bleeding   Complete by: As directed    PRETERM LABOR:  Includes any of the follwing symptoms that occur between 20 - [redacted] weeks gestation.  If these symptoms are not stopped, preterm labor can result in preterm delivery, placing your baby at risk   Complete by: As directed      Allergies as of 11/15/2019      Reactions   Zoloft [sertraline Hcl] Hives   Sulfa Antibiotics Hives      Medication List    TAKE these medications   acetaminophen  500 MG tablet Commonly known as: TYLENOL Take 1,000 mg by mouth every 6 (six) hours as needed for mild pain or headache.   pantoprazole 40 MG tablet Commonly known as: Protonix Take 1 tablet (40 mg total) by mouth daily.   PreNatal DHA 200 MG Caps Generic drug: Docosahexaenoic Acid Prenatal + DHA        Signed: Zafirah Vanzee 11/15/2019, 7:09 AM

## 2019-11-20 ENCOUNTER — Other Ambulatory Visit: Payer: Medicaid Other

## 2019-11-20 ENCOUNTER — Inpatient Hospital Stay: Payer: Medicaid Other | Attending: Hematology and Oncology

## 2019-11-20 ENCOUNTER — Other Ambulatory Visit: Payer: Self-pay

## 2019-11-20 DIAGNOSIS — D693 Immune thrombocytopenic purpura: Secondary | ICD-10-CM | POA: Diagnosis not present

## 2019-11-20 DIAGNOSIS — D696 Thrombocytopenia, unspecified: Secondary | ICD-10-CM

## 2019-11-20 LAB — CBC WITH DIFFERENTIAL (CANCER CENTER ONLY)
Abs Immature Granulocytes: 0.23 10*3/uL — ABNORMAL HIGH (ref 0.00–0.07)
Basophils Absolute: 0 10*3/uL (ref 0.0–0.1)
Basophils Relative: 0 %
Eosinophils Absolute: 0.1 10*3/uL (ref 0.0–0.5)
Eosinophils Relative: 1 %
HCT: 30.6 % — ABNORMAL LOW (ref 36.0–46.0)
Hemoglobin: 10 g/dL — ABNORMAL LOW (ref 12.0–15.0)
Immature Granulocytes: 3 %
Lymphocytes Relative: 21 %
Lymphs Abs: 1.6 10*3/uL (ref 0.7–4.0)
MCH: 30.8 pg (ref 26.0–34.0)
MCHC: 32.7 g/dL (ref 30.0–36.0)
MCV: 94.2 fL (ref 80.0–100.0)
Monocytes Absolute: 0.5 10*3/uL (ref 0.1–1.0)
Monocytes Relative: 7 %
Neutro Abs: 5.2 10*3/uL (ref 1.7–7.7)
Neutrophils Relative %: 68 %
Platelet Count: 83 10*3/uL — ABNORMAL LOW (ref 150–400)
RBC: 3.25 MIL/uL — ABNORMAL LOW (ref 3.87–5.11)
RDW: 13.4 % (ref 11.5–15.5)
WBC Count: 7.7 10*3/uL (ref 4.0–10.5)
nRBC: 0 % (ref 0.0–0.2)

## 2019-11-21 DIAGNOSIS — O09213 Supervision of pregnancy with history of pre-term labor, third trimester: Secondary | ICD-10-CM | POA: Diagnosis not present

## 2019-11-21 DIAGNOSIS — Z3A33 33 weeks gestation of pregnancy: Secondary | ICD-10-CM | POA: Diagnosis not present

## 2019-11-24 ENCOUNTER — Telehealth: Payer: Self-pay | Admitting: *Deleted

## 2019-11-24 NOTE — Telephone Encounter (Signed)
-----   Message from Jaci Standard, MD sent at 11/21/2019 10:24 AM EDT ----- Please let Mrs. Leeman know that her Plt are at 83. This is stable compared to her last check in June 2021.  ----- Message ----- From: Leory Plowman, Lab In [place] Sent: 11/20/2019   8:50 AM EDT To: Jaci Standard, MD

## 2019-11-24 NOTE — Telephone Encounter (Signed)
TCT patient regarding recent lab results. No answer but was able to leave vm message for pt.  Advised that she can call back about her platelet count of 86K if she has any questions .

## 2019-11-28 DIAGNOSIS — Z3A34 34 weeks gestation of pregnancy: Secondary | ICD-10-CM | POA: Diagnosis not present

## 2019-12-02 DIAGNOSIS — Z20822 Contact with and (suspected) exposure to covid-19: Secondary | ICD-10-CM | POA: Diagnosis not present

## 2019-12-04 DIAGNOSIS — Z3A35 35 weeks gestation of pregnancy: Secondary | ICD-10-CM | POA: Diagnosis not present

## 2019-12-04 DIAGNOSIS — Z3685 Encounter for antenatal screening for Streptococcus B: Secondary | ICD-10-CM | POA: Diagnosis not present

## 2019-12-07 DIAGNOSIS — Z419 Encounter for procedure for purposes other than remedying health state, unspecified: Secondary | ICD-10-CM | POA: Diagnosis not present

## 2019-12-10 DIAGNOSIS — Z3A36 36 weeks gestation of pregnancy: Secondary | ICD-10-CM | POA: Diagnosis not present

## 2019-12-10 DIAGNOSIS — O09293 Supervision of pregnancy with other poor reproductive or obstetric history, third trimester: Secondary | ICD-10-CM | POA: Diagnosis not present

## 2019-12-17 ENCOUNTER — Inpatient Hospital Stay: Payer: Medicaid Other | Attending: Hematology and Oncology

## 2019-12-17 ENCOUNTER — Other Ambulatory Visit: Payer: Self-pay

## 2019-12-17 DIAGNOSIS — D693 Immune thrombocytopenic purpura: Secondary | ICD-10-CM | POA: Diagnosis not present

## 2019-12-17 DIAGNOSIS — D696 Thrombocytopenia, unspecified: Secondary | ICD-10-CM

## 2019-12-17 LAB — CBC WITH DIFFERENTIAL (CANCER CENTER ONLY)
Abs Immature Granulocytes: 0.15 10*3/uL — ABNORMAL HIGH (ref 0.00–0.07)
Basophils Absolute: 0 10*3/uL (ref 0.0–0.1)
Basophils Relative: 0 %
Eosinophils Absolute: 0.1 10*3/uL (ref 0.0–0.5)
Eosinophils Relative: 1 %
HCT: 34.1 % — ABNORMAL LOW (ref 36.0–46.0)
Hemoglobin: 11.2 g/dL — ABNORMAL LOW (ref 12.0–15.0)
Immature Granulocytes: 2 %
Lymphocytes Relative: 25 %
Lymphs Abs: 1.7 10*3/uL (ref 0.7–4.0)
MCH: 30.7 pg (ref 26.0–34.0)
MCHC: 32.8 g/dL (ref 30.0–36.0)
MCV: 93.4 fL (ref 80.0–100.0)
Monocytes Absolute: 0.6 10*3/uL (ref 0.1–1.0)
Monocytes Relative: 8 %
Neutro Abs: 4.4 10*3/uL (ref 1.7–7.7)
Neutrophils Relative %: 64 %
Platelet Count: 80 10*3/uL — ABNORMAL LOW (ref 150–400)
RBC: 3.65 MIL/uL — ABNORMAL LOW (ref 3.87–5.11)
RDW: 13.9 % (ref 11.5–15.5)
WBC Count: 7 10*3/uL (ref 4.0–10.5)
nRBC: 0 % (ref 0.0–0.2)

## 2019-12-18 ENCOUNTER — Inpatient Hospital Stay: Payer: Medicaid Other

## 2019-12-18 DIAGNOSIS — O09293 Supervision of pregnancy with other poor reproductive or obstetric history, third trimester: Secondary | ICD-10-CM | POA: Diagnosis not present

## 2019-12-18 DIAGNOSIS — Z3A37 37 weeks gestation of pregnancy: Secondary | ICD-10-CM | POA: Diagnosis not present

## 2019-12-19 ENCOUNTER — Telehealth: Payer: Self-pay | Admitting: *Deleted

## 2019-12-19 NOTE — Telephone Encounter (Signed)
-----   Message from Jaci Standard, MD sent at 12/17/2019 12:38 PM EDT ----- Please let Mrs. Miltenberger know that her platelet count is stable at 80. This is still plenty of platelets for delivery/epidural. Please obtain an updated expected delivery date for this patient.  ----- Message ----- From: Interface, Lab In [place] Sent: 12/17/2019   8:28 AM EDT To: Jaci Standard, MD

## 2019-12-19 NOTE — Telephone Encounter (Signed)
TCT patient and spoke with her. Advised that her platelet count was 80 this week.  Dr. Leonides Schanz advised that she has enough platelets for her delivery/epidural. Pt states she is dilated to 3-4 and hopes to deliver in the next week. If she does not she is to be induced on 12/30/19.Wished her luck.  She voiced understanding about her platelets

## 2019-12-20 ENCOUNTER — Encounter (HOSPITAL_COMMUNITY): Payer: Self-pay | Admitting: Obstetrics and Gynecology

## 2019-12-20 ENCOUNTER — Inpatient Hospital Stay (HOSPITAL_COMMUNITY)
Admission: AD | Admit: 2019-12-20 | Discharge: 2019-12-22 | DRG: 806 | Disposition: A | Discharge status: Home / Self Care | Payer: Medicaid Other | Attending: Obstetrics and Gynecology | Admitting: Obstetrics and Gynecology

## 2019-12-20 ENCOUNTER — Other Ambulatory Visit: Payer: Self-pay

## 2019-12-20 DIAGNOSIS — Z20822 Contact with and (suspected) exposure to covid-19: Secondary | ICD-10-CM | POA: Diagnosis not present

## 2019-12-20 DIAGNOSIS — O9912 Other diseases of the blood and blood-forming organs and certain disorders involving the immune mechanism complicating childbirth: Principal | ICD-10-CM | POA: Diagnosis present

## 2019-12-20 DIAGNOSIS — D693 Immune thrombocytopenic purpura: Secondary | ICD-10-CM | POA: Diagnosis present

## 2019-12-20 DIAGNOSIS — Z3A37 37 weeks gestation of pregnancy: Secondary | ICD-10-CM | POA: Diagnosis not present

## 2019-12-20 DIAGNOSIS — O26893 Other specified pregnancy related conditions, third trimester: Secondary | ICD-10-CM | POA: Diagnosis not present

## 2019-12-20 LAB — CBC
HCT: 37.8 % (ref 36.0–46.0)
Hemoglobin: 12.4 g/dL (ref 12.0–15.0)
MCH: 30.7 pg (ref 26.0–34.0)
MCHC: 32.8 g/dL (ref 30.0–36.0)
MCV: 93.6 fL (ref 80.0–100.0)
Platelets: 89 10*3/uL — ABNORMAL LOW (ref 150–400)
RBC: 4.04 MIL/uL (ref 3.87–5.11)
RDW: 13.9 % (ref 11.5–15.5)
WBC: 8.1 10*3/uL (ref 4.0–10.5)
nRBC: 0 % (ref 0.0–0.2)

## 2019-12-20 LAB — TYPE AND SCREEN
ABO/RH(D): O NEG
Antibody Screen: NEGATIVE

## 2019-12-20 LAB — SARS CORONAVIRUS 2 BY RT PCR (HOSPITAL ORDER, PERFORMED IN ~~LOC~~ HOSPITAL LAB): SARS Coronavirus 2: NEGATIVE

## 2019-12-20 MED ORDER — TRANEXAMIC ACID-NACL 1000-0.7 MG/100ML-% IV SOLN
INTRAVENOUS | Status: AC
  Filled 2019-12-20: qty 100

## 2019-12-20 MED ORDER — IBUPROFEN 600 MG PO TABS
600.0000 mg | ORAL_TABLET | Freq: Four times a day (QID) | ORAL | Status: DC
  Administered 2019-12-20 – 2019-12-22 (×8): 600 mg via ORAL
  Filled 2019-12-20 (×8): qty 1

## 2019-12-20 MED ORDER — MEDROXYPROGESTERONE ACETATE 150 MG/ML IM SUSP: 150.0000 mg | INTRAMUSCULAR | Status: DC | PRN

## 2019-12-20 MED ORDER — SENNOSIDES-DOCUSATE SODIUM 8.6-50 MG PO TABS
2.0000 | ORAL_TABLET | ORAL | Status: DC
  Administered 2019-12-20 – 2019-12-21 (×2): 2 via ORAL
  Filled 2019-12-20 (×2): qty 2

## 2019-12-20 MED ORDER — OXYCODONE-ACETAMINOPHEN 5-325 MG PO TABS: 2.0000 | ORAL_TABLET | ORAL | Status: DC | PRN

## 2019-12-20 MED ORDER — OXYTOCIN-SODIUM CHLORIDE 30-0.9 UT/500ML-% IV SOLN
2.5000 [IU]/h | INTRAVENOUS | Status: DC
  Administered 2019-12-20: 2.5 [IU]/h via INTRAVENOUS

## 2019-12-20 MED ORDER — OXYCODONE-ACETAMINOPHEN 5-325 MG PO TABS: 1.0000 | ORAL_TABLET | ORAL | Status: DC | PRN

## 2019-12-20 MED ORDER — OXYTOCIN-SODIUM CHLORIDE 30-0.9 UT/500ML-% IV SOLN
INTRAVENOUS | Status: AC
  Filled 2019-12-20: qty 500

## 2019-12-20 MED ORDER — DIBUCAINE (PERIANAL) 1 % EX OINT: 1.0000 "application " | TOPICAL_OINTMENT | CUTANEOUS | Status: DC | PRN

## 2019-12-20 MED ORDER — BENZOCAINE-MENTHOL 20-0.5 % EX AERO
1.0000 "application " | INHALATION_SPRAY | CUTANEOUS | Status: DC | PRN
  Administered 2019-12-20: 1 via TOPICAL
  Filled 2019-12-20: qty 56

## 2019-12-20 MED ORDER — COCONUT OIL OIL: 1.0000 "application " | TOPICAL_OIL | Status: DC | PRN

## 2019-12-20 MED ORDER — SIMETHICONE 80 MG PO CHEW: 80.0000 mg | CHEWABLE_TABLET | ORAL | Status: DC | PRN

## 2019-12-20 MED ORDER — TETANUS-DIPHTH-ACELL PERTUSSIS 5-2.5-18.5 LF-MCG/0.5 IM SUSP: 0.5000 mL | Freq: Once | INTRAMUSCULAR | Status: DC

## 2019-12-20 MED ORDER — BUTORPHANOL TARTRATE 1 MG/ML IJ SOLN: 1.0000 mg | INTRAMUSCULAR | Status: DC | PRN

## 2019-12-20 MED ORDER — PRENATAL MULTIVITAMIN CH
1.0000 | ORAL_TABLET | Freq: Every day | ORAL | Status: DC
  Administered 2019-12-21 – 2019-12-22 (×2): 1 via ORAL
  Filled 2019-12-20 (×2): qty 1

## 2019-12-20 MED ORDER — ONDANSETRON HCL 4 MG PO TABS: 4.0000 mg | ORAL_TABLET | ORAL | Status: DC | PRN

## 2019-12-20 MED ORDER — TRANEXAMIC ACID-NACL 1000-0.7 MG/100ML-% IV SOLN
1000.0000 mg | INTRAVENOUS | Status: AC
  Administered 2019-12-20: 1000 mg via INTRAVENOUS

## 2019-12-20 MED ORDER — ACETAMINOPHEN 325 MG PO TABS
650.0000 mg | ORAL_TABLET | ORAL | Status: DC | PRN
  Administered 2019-12-20 – 2019-12-21 (×2): 650 mg via ORAL
  Filled 2019-12-20 (×2): qty 2

## 2019-12-20 MED ORDER — SOD CITRATE-CITRIC ACID 500-334 MG/5ML PO SOLN: 30.0000 mL | ORAL | Status: DC | PRN

## 2019-12-20 MED ORDER — LIDOCAINE HCL (PF) 1 % IJ SOLN: 30.0000 mL | INTRAMUSCULAR | Status: DC | PRN

## 2019-12-20 MED ORDER — LACTATED RINGERS IV SOLN: INTRAVENOUS | Status: DC

## 2019-12-20 MED ORDER — ZOLPIDEM TARTRATE 5 MG PO TABS: 5.0000 mg | ORAL_TABLET | Freq: Every evening | ORAL | Status: DC | PRN

## 2019-12-20 MED ORDER — LACTATED RINGERS IV SOLN: 500.0000 mL | INTRAVENOUS | Status: DC | PRN

## 2019-12-20 MED ORDER — ONDANSETRON HCL 4 MG/2ML IJ SOLN: 4.0000 mg | INTRAMUSCULAR | Status: DC | PRN

## 2019-12-20 MED ORDER — FLEET ENEMA 7-19 GM/118ML RE ENEM: 1.0000 | ENEMA | RECTAL | Status: DC | PRN

## 2019-12-20 MED ORDER — OXYTOCIN BOLUS FROM INFUSION
333.0000 mL | Freq: Once | INTRAVENOUS | Status: AC
  Administered 2019-12-20: 333 mL via INTRAVENOUS

## 2019-12-20 MED ORDER — ACETAMINOPHEN 325 MG PO TABS: 650.0000 mg | ORAL_TABLET | ORAL | Status: DC | PRN

## 2019-12-20 MED ORDER — MEASLES, MUMPS & RUBELLA VAC IJ SOLR: 0.5000 mL | Freq: Once | INTRAMUSCULAR | Status: DC

## 2019-12-20 MED ORDER — ONDANSETRON HCL 4 MG/2ML IJ SOLN: 4.0000 mg | Freq: Four times a day (QID) | INTRAMUSCULAR | Status: DC | PRN

## 2019-12-20 MED ORDER — WITCH HAZEL-GLYCERIN EX PADS
1.0000 "application " | MEDICATED_PAD | CUTANEOUS | Status: DC | PRN
  Administered 2019-12-21: 1 via TOPICAL

## 2019-12-20 MED ORDER — DIPHENHYDRAMINE HCL 25 MG PO CAPS: 25.0000 mg | ORAL_CAPSULE | Freq: Four times a day (QID) | ORAL | Status: DC | PRN

## 2019-12-20 NOTE — Lactation Note (Signed)
This note was copied from a baby's chart. Lactation Consultation Note  Patient Name: Shelly Andrews QAESL'P Date: 12/20/2019   [name] now 8 hours old born at 37 weeks and 6 days gestation.     Mom breastfeeding [name] on arrival in football on left breast.  [name] is falling asleep.  Repositioned him on left breast in cross cradle. He would root and open but not wide.  Mom would put her hand right there at nipple/areola and end up puulling breast tissue out of his mouth.   Urged mom to break suction and take him off.  Repositioned him in cross cradle on right breast. After prepumping with manual pump. Nipple everted more and drops of colostrum noted. Showed this too mom and drops of colostrum noted.  Infant is awake and alert  but not opening or appearing interested in feeding at this time.  Put STS with mom.  Assisted with some hand expression and spoon feeding.  Mom reported hand expression painful at first but then reported it was okay. Gave infant a few drops from spoon and left STS.  Urged to feed on cue and 8-12 or more times day.  Reviewed and left [facility] breastfeeding Consultation Services handout.     Esme Freund S Niema Carrara 12/20/2019, 10:29 PM

## 2019-12-20 NOTE — H&P (Signed)
Shelly Andrews is a 39 y.o. female presenting for active labor and vaginal bleeding. 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  GBS-. OB History    Gravida  6   Para  4   Term  1   Preterm  3   AB  1   Living  3     SAB  0   TAB  0   Ectopic  1   Multiple  0   Live Births  3          Past Medical History:  Diagnosis Date  . Depression   . Splenomegaly 04/16/2013  . Thrombocytopenia (HCC)    Idopathic with pregnancy   Past Surgical History:  Procedure Laterality Date  . IUD REMOVAL  10/30/2018   Procedure: Intrauterine Device (Iud) Removal;  Surgeon: [name], MD;  Location: [facility] OR;  Service: Gynecology;;  . LAPAROSCOPIC UNILATERAL SALPINGO OOPHERECTOMY Right 10/30/2018   Procedure: LAPAROSCOPIC RIGHT SALPINGECTOMY AND REMOVAL OF ECTOPIC PREGNANCY;  Surgeon: [name], MD;  Location: [facility] OR;  Service: Gynecology;  Laterality: Right;  . WISDOM TOOTH EXTRACTION     Family History: family history includes Diabetes in her maternal grandmother and mother. Social History:  reports that she has never smoked. She has never used smokeless tobacco. She reports previous alcohol use. She reports that she does not use drugs.     Maternal Diabetes: No Genetic Screening: Normal Maternal Ultrasounds/Referrals: Normal Fetal Ultrasounds or other Referrals:  None Maternal Substance Abuse:  No Significant Maternal Medications:  None Significant Maternal Lab Results:  Group B Strep negative Other Comments:  None  Review of Systems History Dilation: 10 Effacement (%): 100 Station: 0 Exam by:: Rana Snare, MD Blood pressure 121/74, pulse (!) 106, temperature 98.2 F (36.8 C), temperature source Oral, resp. rate 18, last menstrual period 04/03/2019, SpO2 99 %, unknown if currently breastfeeding. Exam Physical Exam  Prenatal labs: ABO, Rh: --/--/O NEG (08/14 1220) Antibody: NEG (08/14 1220) Rubella:   RPR:    HBsAg:    HIV:    GBS:     Assessment/Plan: IUP at term Active labor   Anticipate SVD Plan empiric TXA after SVD   Turner Daniels 12/20/2019, 1:57 PM

## 2019-12-20 NOTE — MAU Note (Signed)
Shelly Andrews is a 39 y.o. at [redacted]w[redacted]d here in MAU reporting: contractions that started this morning, they are now every 2 minutes. Noticed a trickle of fluid around 11, unsure about color. No bleeding. +FM  Onset of complaint: today  Pain score: 8/10  Vitals:   12/20/19 1208  BP: 110/66  Pulse: (!) 111  Resp: 20  Temp: 98.6 F (37 C)  SpO2: 99%     FHT: +FM  Lab orders placed from triage: none

## 2019-12-20 NOTE — Progress Notes (Signed)
Patient Platelet count is 89,000. Patient can take Ibuprofen as scheduled per Dr. Rana Snare.

## 2019-12-21 LAB — CBC
HCT: 29.9 % — ABNORMAL LOW (ref 36.0–46.0)
Hemoglobin: 9.5 g/dL — ABNORMAL LOW (ref 12.0–15.0)
MCH: 29.9 pg (ref 26.0–34.0)
MCHC: 31.8 g/dL (ref 30.0–36.0)
MCV: 94 fL (ref 80.0–100.0)
Platelets: 84 10*3/uL — ABNORMAL LOW (ref 150–400)
RBC: 3.18 MIL/uL — ABNORMAL LOW (ref 3.87–5.11)
RDW: 13.9 % (ref 11.5–15.5)
WBC: 9.4 10*3/uL (ref 4.0–10.5)
nRBC: 0 % (ref 0.0–0.2)

## 2019-12-21 LAB — RPR: RPR Ser Ql: NONREACTIVE

## 2019-12-21 MED ORDER — RHO D IMMUNE GLOBULIN 1500 UNIT/2ML IJ SOSY
300.0000 ug | PREFILLED_SYRINGE | Freq: Once | INTRAMUSCULAR | Status: AC
  Administered 2019-12-21: 300 ug via INTRAVENOUS
  Filled 2019-12-21: qty 2

## 2019-12-21 NOTE — Progress Notes (Signed)
Post Partum Day 1 Subjective: no complaints, up ad lib, voiding and tolerating PO  Objective: Blood pressure 100/62, pulse 70, temperature 98.6 F (37 C), temperature source Oral, resp. rate 16, last menstrual period 04/03/2019, SpO2 95 %, unknown if currently breastfeeding.  Physical Exam:  General: alert, cooperative, appears stated age and no distress Lochia: appropriate Uterine Fundus: firm Incision: healing well DVT Evaluation: No evidence of DVT seen on physical exam.  Recent Labs    12/20/19 1222 12/21/19 0555  HGB 12.4 9.5*  HCT 37.8 29.9*    Assessment/Plan: Plan for discharge tomorrow, Breastfeeding and Circumcision prior to discharge  Plts stable and no excessive bleeding   LOS: 1 day   Shelly Andrews 12/21/2019, 10:20 AM

## 2019-12-21 NOTE — Progress Notes (Signed)
CSW received consult for hx of Anxiety and Edin score of 12 with 1 answered for number 10.  CSW met with [name] at bedside to offer support and complete assessment. On arrival, CSW introduced self and stated reason for visit. Infant Austin was present and being held by [name]. [name] was pleasant and engaged during visit.   During assessment, [name] reported score of 12 was incorrect. [name] denied and hx of SI. CSW and [name] completed EDPS together. [name] scored a 10 with a zero on question 10. A score of 10 still triggeres consult.  [name] reported history of anxiety and depression, related to relationship with mother and adoption by grandparents. [name] stated her mother's absence causes her to suffer from abandonment concerns. [name] added, a series of loss; fetal demise at seven month 2013, death of grandmother 2018, and grandfather 2020, has also made her sad over the past few years. [name] stated being around her children is her coping mechanism. [name] stated sx such as deep sadness and crying may last a day or two. [name] identified her best friend [name] and FOB as support, and daily calls keep her going. [name] denied any SI, HI, or domestic violence. CSW validated feelings and encouraged [name] to consider grief support therapy. [name] stated she may consider it in the future. [name] also stated she was interested in restarting medication for anxiety and depression. [name] stated she is allergic to Zoloft and is interested in talking with physician about breastfeeding safe Rx. CSW informed RN Jessica.   CSW provided education regarding the baby blues period vs. perinatal mood disorders, discussed treatment and gave resources for mental health follow up if concerns arise.  CSW recommends self-evaluation during the postpartum time period using the New Mom Checklist from Postpartum Progress and encouraged [name] to contact a medical professional if symptoms are noted at any time. [name] stated understanding and denied any questions. [name] denied any PPD/A hx.      CSW provided review of Sudden Infant Death Syndrome (SIDS) precautions. [name] stated understanding and denied any questions. [name] confirmed having all needed items for baby including car and bassinet and crib for baby's safe sleep.     CSW identifies no further need for intervention and no barriers to discharge at this time.  Shelly Andrews, MSW, LCSW Clinical Social Worker [phone] 

## 2019-12-22 LAB — RH IG WORKUP (INCLUDES ABO/RH)
ABO/RH(D): O NEG
Fetal Screen: NEGATIVE
Gestational Age(Wks): 37.6
Unit division: 0

## 2019-12-22 MED ORDER — IBUPROFEN 600 MG PO TABS: 600.0000 mg | ORAL_TABLET | Freq: Four times a day (QID) | ORAL | 0 refills | Status: DC

## 2019-12-22 NOTE — Progress Notes (Signed)
Post Partum Day 2 Subjective: no complaints, up ad lib, voiding and tolerating PO  Objective: Blood pressure (!) 98/53, pulse 80, temperature 98.2 F (36.8 C), temperature source Oral, resp. rate 18, last menstrual period 04/03/2019, SpO2 96 %, unknown if currently breastfeeding.  Physical Exam:  General: alert, cooperative and appears stated age Lochia: appropriate Uterine Fundus: firm Incision: n/a DVT Evaluation: No evidence of DVT seen on physical exam. Negative Homan's sign. No cords or calf tenderness.  Recent Labs    12/20/19 1222 12/21/19 0555  HGB 12.4 9.5*  HCT 37.8 29.9*    Assessment/Plan: Discharge home and Breastfeeding   LOS: 2 days   Mitchel Honour 12/22/2019, 10:53 AM

## 2019-12-22 NOTE — Discharge Summary (Signed)
Postpartum Discharge Summary     Patient Name: Shelly Andrews DOB: 06-Feb-1981 MRN: [id number]  Date of admission: 12/20/2019 Delivery date:12/20/2019  Delivering provider: Louretta Shorten  Date of discharge: 12/22/2019  Admitting diagnosis: Indication for care in labor or delivery [O75.9] Intrauterine pregnancy: [redacted]w[redacted]d    Secondary diagnosis:  Active Problems:   Indication for care in labor or delivery  Additional problems: ITP    Discharge diagnosis: Term Pregnancy Delivered                                              Post partum procedures:n/a Augmentation: AROM and Pitocin Complications: None  Hospital course: Onset of Labor With Vaginal Delivery      39y.o. yo [id number] admitted in Active Labor on 12/20/2019. Patient had an uncomplicated labor course as follows:  Membrane Rupture Time/Date: 1:45 PM ,12/20/2019   Delivery Method:Vaginal, Spontaneous  Episiotomy: None  Lacerations:  None  Patient had an uncomplicated postpartum course.  She is ambulating, tolerating a regular diet, passing flatus, and urinating well. Patient is discharged home in stable condition on 12/22/19.  Newborn Data: Birth date:12/20/2019  Birth time:1:48 PM  Gender:Female  Living status:Living  Apgars:8 ,9  Weight:3379 g   Magnesium Sulfate received: No BMZ received: No Rhophylac:No MMR:No T-DaP:Given prenatally Flu: No Transfusion:No  Physical exam  Vitals:   12/21/19 0140 12/21/19 0530 12/21/19 2156 12/22/19 0452  BP: 106/69 100/62 99/70 (!) 98/53  Pulse: 78 70 83 80  Resp: '18 16 16 18  '$ Temp: 99 F (37.2 C) 98.6 F (37 C) 98.6 F (37 C) 98.2 F (36.8 C)  TempSrc: Oral Oral Oral Oral  SpO2: 98% 95% 95% 96%   General: alert, cooperative and no distress Lochia: appropriate Uterine Fundus: firm Incision: N/A DVT Evaluation: No evidence of DVT seen on physical exam. Negative Homan's sign. No cords or calf tenderness. Labs: Lab Results  Component Value Date   WBC 9.4  12/21/2019   HGB 9.5 (L) 12/21/2019   HCT 29.9 (L) 12/21/2019   MCV 94.0 12/21/2019   PLT 84 (L) 12/21/2019   CMP Latest Ref Rng & Units 10/23/2019  Glucose 70 - 99 mg/dL 142(H)  BUN 6 - 20 mg/dL 5(L)  Creatinine 0.44 - 1.00 mg/dL 0.68  Sodium 135 - 145 mmol/L 139  Potassium 3.5 - 5.1 mmol/L 3.2(L)  Chloride 98 - 111 mmol/L 108  CO2 22 - 32 mmol/L 21(L)  Calcium 8.9 - 10.3 mg/dL 8.1(L)  Total Protein 6.5 - 8.1 g/dL 5.5(L)  Total Bilirubin 0.3 - 1.2 mg/dL 0.5  Alkaline Phos 38 - 126 U/L 90  AST 15 - 41 U/L 16  ALT 0 - 44 U/L 12   Edinburgh Score: Edinburgh Postnatal Depression Scale Screening Tool 12/20/2019  I have been able to laugh and see the funny side of things. 0  I have looked forward with enjoyment to things. 0  I have blamed myself unnecessarily when things went wrong. 2  I have been anxious or worried for no good reason. 2  I have felt scared or panicky for no good reason. 1  Things have been getting on top of me. 1  I have been so unhappy that I have had difficulty sleeping. 2  I have felt sad or miserable. 2  I have been so unhappy that I have  been crying. 1  The thought of harming myself has occurred to me. 1  Edinburgh Postnatal Depression Scale Total 12     After visit meds:  Allergies as of 12/22/2019      Reactions   Zoloft [sertraline Hcl] Hives   Sulfa Antibiotics Hives      Medication List    TAKE these medications   acetaminophen 500 MG tablet Commonly known as: TYLENOL Take 1,000 mg by mouth every 6 (six) hours as needed for mild pain or headache.   ibuprofen 600 MG tablet Commonly known as: ADVIL Take 1 tablet (600 mg total) by mouth every 6 (six) hours.   pantoprazole 40 MG tablet Commonly known as: Protonix Take 1 tablet (40 mg total) by mouth daily.   PreNatal DHA 200 MG Caps Generic drug: Docosahexaenoic Acid Take 1 capsule by mouth daily.        Discharge home in stable condition Infant Feeding: Breast Infant  Disposition:home with mother Discharge instruction: per After Visit Summary and Postpartum booklet. Activity: Advance as tolerated. Pelvic rest for 6 weeks.  Diet: routine diet Future Appointments:  Future Appointments  Date Time Provider [place]  03/24/2020  8:00 AM [facility] 6 [facility] None  03/24/2020  8:30 AM Orson Slick, MD [facility] None   Follow up Visit: 6 weeks postpartum visit  12/22/2019 Linda Hedges, DO

## 2019-12-22 NOTE — Discharge Instructions (Signed)
Call MD for T>100.4, heavy vaginal bleeding, severe abdominal pain or respiratory distress.  Call office to schedule postpartum visit in 6 weeks.  Pelvic rest x 6 weeks.   

## 2019-12-22 NOTE — Lactation Note (Signed)
This note was copied from a baby's chart. Lactation Consultation Note  Patient Name: Shelly Andrews [id number]'P Date: 12/22/2019 Reason for consult: Infant weight loss;Other (Comment) (8% weight loss) Baby is 47 hours old  Per mom baby has eaten since 5:30 am 30 mins and attempt at 8 am.  Mom was pumping as LC entered the room and 6 ml EBM.  LC showed mom how to pace feed and baby tolerated well.  Baby more awake and LC assisted mom to latch in the left breast / cross cradle.  Depth achieved and LC assisted to flip upper lip and ease chin.  Baby was still feeding at 10 mins with increased swallows by breast compressions and  Per mom comfortable.  [facility] documented 30 mins for total feeding.  Mom mentioned she was having sore nipples / no breakdown / LC provided mom with comfort gels x6 days and shells while awake.  LC recommended EBM to nipples liberally.  Per mom has a DEBP at home.  Mom aware of LC resources and pamphlet provided with phone numbers.   Maternal Data    Feeding Feeding Type: Breast Fed  LATCH Score Latch: Grasps breast easily, tongue down, lips flanged, rhythmical sucking.  Audible Swallowing: Spontaneous and intermittent  Type of Nipple: Everted at rest and after stimulation  Comfort (Breast/Nipple): Filling, red/small blisters or bruises, mild/mod discomfort  Hold (Positioning): Assistance needed to correctly position infant at breast and maintain latch.  LATCH Score: 8  Interventions Interventions: Breast feeding basics reviewed;Assisted with latch;Skin to skin;Breast massage;Hand express;Breast compression;Adjust position;Support pillows;Position options  Lactation Tools Discussed/Used Tools: Pump Breast pump type: Double-Electric Breast Pump   Consult Status Consult Status: Complete Date: 12/22/19 Follow-up type: In-patient    Shelly Andrews 12/22/2019, 11:24 AM

## 2019-12-22 NOTE — Lactation Note (Signed)
This note was copied from a baby's chart. Lactation Consultation Note  Patient Name: Shelly Andrews [id number] Date: 12/22/2019 Reason for consult: Follow-up assessment;Other (Comment) (mom and baby asleep - will revisit)   Maternal Data    Feeding Feeding Type: Breast Fed  LATCH Score                   Interventions    Lactation Tools Discussed/Used     Consult Status Consult Status: Follow-up Date: 12/22/19 Follow-up type: In-patient    Shelly Andrews 12/22/2019, 8:28 AM

## 2019-12-23 ENCOUNTER — Telehealth: Payer: Self-pay | Admitting: *Deleted

## 2019-12-23 NOTE — Telephone Encounter (Addendum)
Contacted pt to complete transition of care assessment:  Transition Care Management Follow-up Telephone Call  . Medicaid Managed Care Transition Call Status:MM [facility] Call Made  . Date of discharge and from where: [facility], 12/22/19  . How have you been since you were released from the hospital? "ok"  . Any questions or concerns? Trying to breast feed. Was out yesterday, and did not get to breast feed for a while. One breast is now hard.  Items Reviewed: Marland Kitchen Did the pt receive and understand the discharge instructions provided? Yes per postpartum guide . Medications obtained and verified? Yes  . Any new allergies since your discharge? No  . Dietary orders reviewed? No  . Do you have support at home?  Yes, family Functional Questionnaire: (I = Independent and D = Dependent)  ADLs: Independent Bathing/Dressing:Independent Meal Prep: Independent Eating: Independent Maintaining continence: Independent Transferring/Ambulation: Independent Managing Meds: Independent   Follow up appointments reviewed:   PCP Hospital f/u appt confirmed? No     Specialist Hospital f/u appt confirmed? No, Patient to call Dr Vance Gather office to schedule follow up appt  Are transportation arrangements needed? No   If their condition worsens, is the pt aware to call PCP or go to the EmergencyDept.? Yes Was the patient provided with contact information for the PCP's office or ED? Yes  Was to pt encouraged to call back with questions or concerns? Yes  Left message on voicemail of lactation consultant along with patient's and my callback number. Order placed for Community Care Coordinationdue to patiient symptoms.  Burnard Bunting, RN, BSN, CCRN Patient Engagement Center [phone]

## 2019-12-24 ENCOUNTER — Other Ambulatory Visit: Payer: Self-pay | Admitting: *Deleted

## 2019-12-24 NOTE — Patient Outreach (Signed)
Care Coordination  12/24/2019  Shelly Andrews 17-Nov-1980 [id number]  Initial telephone outreach for Managed Medicaid - [name] delivered a baby boy, Shelly Andrews, on 12/20/19. She had a vaginal delivery with no complications. She is breast feeding.She and the baby were discharged home on 12/23/19. Unsure if she lives alone or with others.  Called today and [name] was not able to speak with me she and the baby are going to the pediatrician for a wt check.  Advised my co-worker would check in with them before the end of the week, [name], RN.  [name]. [name], MSN, [facility] Gerontological Nurse Practitioner [facility] Care Management [phone]

## 2019-12-30 ENCOUNTER — Other Ambulatory Visit: Payer: Self-pay | Admitting: *Deleted

## 2019-12-30 ENCOUNTER — Encounter: Payer: Self-pay | Admitting: *Deleted

## 2019-12-30 ENCOUNTER — Inpatient Hospital Stay (HOSPITAL_COMMUNITY)
Admission: AD | Admit: 2019-12-30 | Payer: Medicaid Other | Source: Home / Self Care | Admitting: Obstetrics & Gynecology

## 2019-12-30 ENCOUNTER — Inpatient Hospital Stay (HOSPITAL_COMMUNITY): Payer: Medicaid Other

## 2019-12-30 NOTE — Patient Outreach (Addendum)
Care Coordination - Case Manager  12/30/2019  Shelly Andrews 09/02/1980 [id number]  Subjective:  Shelly Andrews is an 39 y.o. year old female who is a primary patient of Shelly Contras, MD.  Shelly Andrews was given information about Medicaid Managed Care team care coordination services today. Shelly Andrews agreed to services and verbal consent obtained  Review of patient status, laboratory and other test data was performed as part of evaluation for provision of services.   SDOH: SDOH Screenings   Alcohol Screen:   . Last Alcohol Screening Score (AUDIT): Not on file  Depression (PHQ2-9): Low Risk   . PHQ-2 Score: 0  Financial Resource Strain:   . Difficulty of Paying Living Expenses: Not on file  Food Insecurity: No Food Insecurity  . Worried About Programme researcher, broadcasting/film/video in the Last Year: Never true  . Ran Out of Food in the Last Year: Never true  Housing: Low Risk   . Last Housing Risk Score: 0  Physical Activity:   . Days of Exercise per Week: Not on file  . Minutes of Exercise per Session: Not on file  Social Connections:   . Frequency of Communication with Friends and Family: Not on file  . Frequency of Social Gatherings with Friends and Family: Not on file  . Attends Religious Services: Not on file  . Active Member of Clubs or Organizations: Not on file  . Attends Banker Meetings: Not on file  . Marital Status: Not on file  Stress: No Stress Concern Present  . Feeling of Stress : Not at all  Tobacco Use: Low Risk   . Smoking Tobacco Use: Never Smoker  . Smokeless Tobacco Use: Never Used  Transportation Needs: No Transportation Needs  . Lack of Transportation (Medical): No  . Lack of Transportation (Non-Medical): No   SDOH Interventions     Most Recent Value  SDOH Interventions  Food Insecurity Interventions Intervention Not Indicated  Housing Interventions Intervention Not Indicated  Stress Interventions Intervention Not Indicated    Transportation Interventions Intervention Not Indicated      Objective:    Allergies  Allergen Reactions  . Zoloft [Sertraline Hcl] Hives  . Sulfa Antibiotics Hives    Medications:    Medications Reviewed Today    Reviewed by Shelly Gallant, RN (Registered Nurse) on 12/30/19 at [phone]  Med List Status: <None>  Medication Order Taking? Sig Documenting Provider Last Dose Status Informant  acetaminophen (TYLENOL) 500 MG tablet [id number]  Take 1,000 mg by mouth every 6 (six) hours as needed for mild pain or headache.  [provider]  Active Self  Docosahexaenoic Acid (PRENATAL DHA) 200 MG CAPS [id number]  Take 1 capsule by mouth daily.  [provider]  Active Self  ibuprofen (ADVIL) 600 MG tablet [id number]  Take 1 tablet (600 mg total) by mouth every 6 (six) hours. Morris, Aundra Millet, DO  Active   pantoprazole (PROTONIX) 40 MG tablet [id number]  Take 1 tablet (40 mg total) by mouth daily. Shelly Horn, NP  Active Self          Assessment:  [facility] Medicaid managed referral Referral Date: 12/23/19 Referral Source: managed medicaid  Reason for Referral:             Care Coordination (Managed Medicaid)           MM Services needed:            Nurse Case Manager   Patient has complaints of "hardness  breast"; left message on voicemail of lactation consultant. She was discharged on 12/22/19   Patient is able to verify HIPAA [facility] Portability and Accountability Andrews) identifiers, date of birth (DOB) and address Reviewed and addressed the purpose of the follow up call with the patient  Shelly Andrews reports she and Shelly Andrews are doing very well She denies any medical concerns or pain of breast and reports Shelly is gaining weight without any noted medical issues Shelly was born vaginally at 6 lbs and 13 ounces without complications. On first weight check in post hospital, he was at 6 lbs and 13 ounces. With this recent weight check in he ws a 7 lbs and 2 ounces. 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 She reports a blood platelet drop with her pregnancies with various treatment offers. She reports no need of an epidural with her recent deliver and no use of progesterone during the pregnancy    PCP- states has not seen pcp in years Request she outreach to pcp office to verify she is still an active patient Provided her with [facility] RN CM, [facility] NP and [facility] 24 hour nurse call center numbers  . Social: Shelly Andrews lives alone but has support of her family She has 4 children  She is independent with her care needs and gets assist from family members prn  Past Medical History:  Diagnosis Date  . Depression   . Splenomegaly 04/16/2013  . Thrombocytopenia (HCC)    Idopathic with pregnancy  never smoker  SDOH Screenings   Alcohol Screen:   . Last Alcohol Screening Score (AUDIT): Not on file  Depression (PHQ2-9): Low Risk   . PHQ-2 Score: 0  Financial Resource Strain:   . Difficulty of Paying Living Expenses: Not on file  Food Insecurity: No Food Insecurity  . Worried About Programme researcher, broadcasting/film/video in the Last Year: Never true  . Ran Out of Food in the Last Year: Never true  Housing: Low Risk   . Last Housing Risk Score: 0  Physical Activity:   . Days of Exercise per Week: Not on file  . Minutes of Exercise per Session: Not on file  Social Connections:   . Frequency of Communication with Friends and Family: Not on file  . Frequency of Social Gatherings with Friends and Family: Not on file  . Attends Religious Services: Not on file  . Active Member of Clubs or Organizations: Not on  file  . Attends Banker Meetings: Not on file  . Marital Status: Not on file  Stress: No Stress Concern Present  . Feeling of Stress : Not at all  Tobacco Use: Low Risk   . Smoking Tobacco Use: Never Smoker  . Smokeless Tobacco Use: Never Used  Transportation Needs: No Transportation Needs  . Lack of Transportation (Medical): No  . Lack of Transportation (Non-Medical): No    DME: none needed Medications: She denies concerns with taking medications as prescribed, affording medications, side effects of medications and questions about medications Appointments:   Advance Directives:  Goals Addressed   None  Plan:  [facility] RN CM will updated [facility]  GNP-BC, C Spinks who will follow up with Shelly Yetta Barre  Pt encouraged to return a call to [facility] RN CM prn [facility] RN CM sent a Science writer with [facility] brochure, [employer], [facility] consent form with return envelope and know before you go sheet enclosed for review MD involvement barriers letter sent    Cala Bradford L. Noelle Penner, RN, BSN, CCM [facility] Telephonic Care Management Care Coordinator Office number [phone] Mobile number [phone]  Main [facility] number [phone] Fax number [phone]

## 2020-01-02 ENCOUNTER — Other Ambulatory Visit: Payer: Self-pay | Admitting: *Deleted

## 2020-01-02 NOTE — Patient Outreach (Signed)
   Care Coordination - Case Manager  01/02/2020  TRIVIA HEFFELFINGER 1980-12-10 [id number]  Subjective:  2nd week post birth.  Shelly Andrews is an 39 y.o. year old female who is a primary patient of [name], MD.   Ms. Gates was given information about Medicaid Managed Care team care coordination services today. [name] agreed to services and verbal consent obtained  Review of patient status, laboratory and other test data was performed as part of evaluation for provision of services.  SDOH: SDOH Screenings   Alcohol Screen:   . Last Alcohol Screening Score (AUDIT): Not on file  Depression (PHQ2-9): Low Risk   . PHQ-2 Score: 0  Financial Resource Strain:   . Difficulty of Paying Living Expenses: Not on file  Food Insecurity: No Food Insecurity  . Worried About Programme researcher, broadcasting/film/video in the Last Year: Never true  . Ran Out of Food in the Last Year: Never true  Housing: Low Risk   . Last Housing Risk Score: 0  Physical Activity:   . Days of Exercise per Week: Not on file  . Minutes of Exercise per Session: Not on file  Social Connections:   . Frequency of Communication with Friends and Family: Not on file  . Frequency of Social Gatherings with Friends and Family: Not on file  . Attends Religious Services: Not on file  . Active Member of Clubs or Organizations: Not on file  . Attends Banker Meetings: Not on file  . Marital Status: Not on file  Stress: No Stress Concern Present  . Feeling of Stress : Not at all  Tobacco Use: Low Risk   . Smoking Tobacco Use: Never Smoker  . Smokeless Tobacco Use: Never Used  Transportation Needs: No Transportation Needs  . Lack of Transportation (Medical): No  . Lack of Transportation (Non-Medical): No     Objective:  N/A  Allergies  Allergen Reactions  . Zoloft [Sertraline Hcl] Hives  . Sulfa Antibiotics Hives    Medications:    Medications Reviewed Today    Reviewed by Clinton Gallant, RN  (Registered Nurse) on 12/30/19 at [phone]  Med List Status: <None>  Medication Order Taking? Sig Documenting Provider Last Dose Status Informant  acetaminophen (TYLENOL) 500 MG tablet [id number]  Take 1,000 mg by mouth every 6 (six) hours as needed for mild pain or headache.  [provider]  Active Self  Docosahexaenoic Acid (PRENATAL DHA) 200 MG CAPS [id number]  Take 1 capsule by mouth daily.  [provider]  Active Self  ibuprofen (ADVIL) 600 MG tablet [id number]  Take 1 tablet (600 mg total) by mouth every 6 (six) hours. Morris, Aundra Millet, DO  Active   pantoprazole (PROTONIX) 40 MG tablet [id number]  Take 1 tablet (40 mg total) by mouth daily. Judeth Horn, NP  Active Self          Assessment: Successful initiation of breast feeding. Baby has gained 6 oz weight.  Mother feels good, no breast pain.   Set goal for Mrs. Griffitts to call NP with any breast feeding problems.  Plan: Will call in one week. Advised to position herself very comfortably and supporting the baby well on her arm with pillow underneath. Feed in quiet setting. Continue using the pump. Reset nipple if not getting milk. RELAX. Listen to music, read a book in a quiet setting.  [name]. [name], MSN, [facility] Gerontological Nurse Practitioner [facility] Care Management [phone]

## 2020-01-07 DIAGNOSIS — Z419 Encounter for procedure for purposes other than remedying health state, unspecified: Secondary | ICD-10-CM | POA: Diagnosis not present

## 2020-01-09 ENCOUNTER — Other Ambulatory Visit: Payer: Self-pay | Admitting: *Deleted

## 2020-01-09 NOTE — Patient Outreach (Signed)
Care Coordination  01/09/2020  SAMAURI KELLENBERGER [birth year] [id number]  Follow up call, post natal. No answer, left message and requested a return call.  [name]. [name], MSN, [facility] Gerontological Nurse Practitioner [facility] Care Management [phone]

## 2020-01-14 ENCOUNTER — Other Ambulatory Visit: Payer: Self-pay | Admitting: *Deleted

## 2020-01-14 NOTE — Patient Outreach (Signed)
[facility]) Care Management  01/14/2020  AYIANNA DARNOLD 10-Feb-1981 [id number]   Second unsuccessful outreach today. Left message and requested a return call.  If no reply will send an unsuccessful letter and call again in 2 weeks.  [name]. [name], MSN, [facility] Gerontological Nurse Practitioner [facility] Care Management [phone]

## 2020-01-21 ENCOUNTER — Other Ambulatory Visit: Payer: Self-pay | Admitting: *Deleted

## 2020-01-21 NOTE — Patient Outreach (Signed)
Care Coordination  01/21/2020  Shelly Andrews Jul 13, 1980 [id number]  Shelly Andrews answered my call today (3rd outreach for this follow up call.  She report the baby, "[name]" is doing fine. He has had another new baby check up and his weight is up to 10.7#. She still feels like she is having some problems with breast feeding, pumping and wondering if he is getting enough nutrients. (His wt is indicative that he is). She has not called the Breast Support line at the [facility] yet. She has a new pump coming which hasn't arrived yet but is expected today.  She is experiencing the let down sensation. Have suggested she try to pump at this time if it is not the baby's feeding time to see if she can obtain more than 1 oz which is her usual. Encouraged quite, space, with calm music, reading a book during pumping. Call [facility] Breast feeding support. The number was previously provided.  Both she and the baby are content. The family has adjusted to the new addition.  Shelly. [name] has recovered well from the vaginal birth.  I will call again in 2 weeks for follow up. Reminded she can call me with any questions.  [name]. [name], MSN, [facility] Gerontological Nurse Practitioner [facility] Care Management [phone]

## 2020-01-27 ENCOUNTER — Telehealth: Payer: Medicaid Other | Admitting: Emergency Medicine

## 2020-01-27 DIAGNOSIS — R21 Rash and other nonspecific skin eruption: Secondary | ICD-10-CM | POA: Diagnosis not present

## 2020-01-27 MED ORDER — PERMETHRIN 5 % EX CREA: 1.0000 "application " | TOPICAL_CREAM | Freq: Once | CUTANEOUS | 0 refills | Status: AC

## 2020-01-27 MED ORDER — TRIAMCINOLONE ACETONIDE 0.1 % EX CREA: 1.0000 "application " | TOPICAL_CREAM | Freq: Two times a day (BID) | CUTANEOUS | 0 refills | Status: AC

## 2020-01-27 MED ORDER — PREDNISONE 20 MG PO TABS
40.0000 mg | ORAL_TABLET | Freq: Every day | ORAL | 0 refills | Status: DC
Start: 2020-01-27 — End: 2020-01-27

## 2020-01-27 NOTE — Addendum Note (Signed)
Addended by: Graciella Freer A on: 01/27/2020 10:17 AM   Modules accepted: Orders

## 2020-01-27 NOTE — Progress Notes (Signed)
E Visit for Rash  We are sorry that you are not feeling well. Here is how we plan to help!  Based on what you shared with me it looks like you have contact dermatitis.  Contact dermatitis is a skin rash caused by something that touches the skin and causes irritation or inflammation.  Your skin may be red, swollen, dry, cracked, and itch.  The rash should go away in a few days but can last a few weeks.  If you get a rash, it's important to figure out what caused it so the irritant can be avoided in the future.  Take Prednisone as directed.  Use Triamcinolone Cream on the areas that are most irritating/itching.   HOME CARE:   Take cool showers and avoid direct sunlight.  Apply cool compress or wet dressings.  Take a bath in an oatmeal bath.  Sprinkle content of one Aveeno packet under running faucet with comfortably warm water.  Bathe for 15-20 minutes, 1-2 times daily.  Pat dry with a towel. Do not rub the rash.  Use hydrocortisone cream.  Take an antihistamine like Benadryl for widespread rashes that itch.  The adult dose of Benadryl is 25-50 mg by mouth 4 times daily.  Caution:  This type of medication may cause sleepiness.  Do not drink alcohol, drive, or operate dangerous machinery while taking antihistamines.  Do not take these medications if you have prostate enlargement.  Read package instructions thoroughly on all medications that you take.  GET HELP RIGHT AWAY IF:   Symptoms don't go away after treatment.  Severe itching that persists.  If you rash spreads or swells.  If you rash begins to smell.  If it blisters and opens or develops a yellow-brown crust.  You develop a fever.  You have a sore throat.  You become short of breath.  MAKE SURE YOU:  Understand these instructions. Will watch your condition. Will get help right away if you are not doing well or get worse.  Thank you for choosing an e-visit. Your e-visit answers were reviewed by a board certified  advanced clinical practitioner to complete your personal care plan. Depending upon the condition, your plan could have included both over the counter or prescription medications. Please review your pharmacy choice. Be sure that the pharmacy you have chosen is open so that you can pick up your prescription now.  If there is a problem you may message your provider in MyChart to have the prescription routed to another pharmacy. Your safety is important to [place]. If you have drug allergies check your prescription carefully.  For the next 24 hours, you can use MyChart to ask questions about today's visit, request a non-urgent call back, or ask for a work or school excuse from your e-visit provider. You will get an email in the next two days asking about your experience. I hope that your e-visit has been valuable and will speed your recovery.  Approximately 5 minutes was spent reviewing the patient's chart.

## 2020-01-27 NOTE — Addendum Note (Signed)
Addended by: Graciella Freer A on: 01/27/2020 10:03 AM   Modules accepted: Orders

## 2020-02-06 DIAGNOSIS — Z419 Encounter for procedure for purposes other than remedying health state, unspecified: Secondary | ICD-10-CM | POA: Diagnosis not present

## 2020-02-09 ENCOUNTER — Other Ambulatory Visit: Payer: Self-pay | Admitting: *Deleted

## 2020-02-09 NOTE — Patient Outreach (Signed)
[facility]) Care Management  02/09/2020  Shelly Andrews [birth year] [id number]  Telephone outreach to advise NP from [facility] will no longer be checking on her but a new nurse will be calling her.  Unable to leave a voice mail. Phone not accepting calls or messages.  [name]. [name], MSN, [facility] Gerontological Nurse Practitioner [facility] Care Management [phone]

## 2020-03-08 DIAGNOSIS — Z419 Encounter for procedure for purposes other than remedying health state, unspecified: Secondary | ICD-10-CM | POA: Diagnosis not present

## 2020-03-10 DIAGNOSIS — Z299 Encounter for prophylactic measures, unspecified: Secondary | ICD-10-CM | POA: Diagnosis not present

## 2020-03-10 DIAGNOSIS — N611 Abscess of the breast and nipple: Secondary | ICD-10-CM | POA: Diagnosis not present

## 2020-03-13 DIAGNOSIS — N611 Abscess of the breast and nipple: Secondary | ICD-10-CM | POA: Diagnosis not present

## 2020-03-24 ENCOUNTER — Inpatient Hospital Stay: Payer: Medicaid Other | Attending: Hematology and Oncology | Admitting: Hematology and Oncology

## 2020-03-24 ENCOUNTER — Inpatient Hospital Stay: Payer: Medicaid Other

## 2020-04-07 DIAGNOSIS — Z419 Encounter for procedure for purposes other than remedying health state, unspecified: Secondary | ICD-10-CM | POA: Diagnosis not present

## 2020-04-08 DIAGNOSIS — Z3202 Encounter for pregnancy test, result negative: Secondary | ICD-10-CM | POA: Diagnosis not present

## 2020-04-08 DIAGNOSIS — Z30430 Encounter for insertion of intrauterine contraceptive device: Secondary | ICD-10-CM | POA: Diagnosis not present

## 2020-05-08 DIAGNOSIS — Z419 Encounter for procedure for purposes other than remedying health state, unspecified: Secondary | ICD-10-CM | POA: Diagnosis not present

## 2020-06-08 DIAGNOSIS — Z419 Encounter for procedure for purposes other than remedying health state, unspecified: Secondary | ICD-10-CM | POA: Diagnosis not present

## 2020-07-06 DIAGNOSIS — Z419 Encounter for procedure for purposes other than remedying health state, unspecified: Secondary | ICD-10-CM | POA: Diagnosis not present

## 2020-07-13 NOTE — Progress Notes (Signed)
Created in error

## 2020-08-05 ENCOUNTER — Inpatient Hospital Stay: Payer: Medicaid Other | Attending: Hematology and Oncology

## 2020-08-05 ENCOUNTER — Other Ambulatory Visit: Payer: Self-pay | Admitting: Hematology and Oncology

## 2020-08-05 ENCOUNTER — Inpatient Hospital Stay: Payer: Medicaid Other | Admitting: Hematology and Oncology

## 2020-08-05 DIAGNOSIS — D693 Immune thrombocytopenic purpura: Secondary | ICD-10-CM

## 2020-08-06 DIAGNOSIS — Z419 Encounter for procedure for purposes other than remedying health state, unspecified: Secondary | ICD-10-CM | POA: Diagnosis not present

## 2020-09-05 DIAGNOSIS — Z419 Encounter for procedure for purposes other than remedying health state, unspecified: Secondary | ICD-10-CM | POA: Diagnosis not present

## 2020-10-06 DIAGNOSIS — Z419 Encounter for procedure for purposes other than remedying health state, unspecified: Secondary | ICD-10-CM | POA: Diagnosis not present

## 2020-11-05 DIAGNOSIS — Z419 Encounter for procedure for purposes other than remedying health state, unspecified: Secondary | ICD-10-CM | POA: Diagnosis not present

## 2020-12-06 DIAGNOSIS — Z419 Encounter for procedure for purposes other than remedying health state, unspecified: Secondary | ICD-10-CM | POA: Diagnosis not present

## 2021-01-06 DIAGNOSIS — Z419 Encounter for procedure for purposes other than remedying health state, unspecified: Secondary | ICD-10-CM | POA: Diagnosis not present

## 2021-01-25 DIAGNOSIS — Z20822 Contact with and (suspected) exposure to covid-19: Secondary | ICD-10-CM | POA: Diagnosis not present

## 2021-02-05 DIAGNOSIS — Z419 Encounter for procedure for purposes other than remedying health state, unspecified: Secondary | ICD-10-CM | POA: Diagnosis not present

## 2021-03-08 DIAGNOSIS — Z419 Encounter for procedure for purposes other than remedying health state, unspecified: Secondary | ICD-10-CM | POA: Diagnosis not present

## 2021-04-07 DIAGNOSIS — Z419 Encounter for procedure for purposes other than remedying health state, unspecified: Secondary | ICD-10-CM | POA: Diagnosis not present

## 2021-05-08 DIAGNOSIS — Z419 Encounter for procedure for purposes other than remedying health state, unspecified: Secondary | ICD-10-CM | POA: Diagnosis not present

## 2021-05-31 ENCOUNTER — Emergency Department (HOSPITAL_BASED_OUTPATIENT_CLINIC_OR_DEPARTMENT_OTHER): Payer: Medicaid Other

## 2021-05-31 ENCOUNTER — Emergency Department (HOSPITAL_BASED_OUTPATIENT_CLINIC_OR_DEPARTMENT_OTHER)
Admission: EM | Admit: 2021-05-31 | Discharge: 2021-05-31 | Disposition: A | Discharge status: Home / Self Care | Payer: Medicaid Other | Attending: Emergency Medicine | Admitting: Emergency Medicine

## 2021-05-31 ENCOUNTER — Encounter (HOSPITAL_BASED_OUTPATIENT_CLINIC_OR_DEPARTMENT_OTHER): Payer: Self-pay

## 2021-05-31 ENCOUNTER — Other Ambulatory Visit: Payer: Self-pay

## 2021-05-31 DIAGNOSIS — S20212A Contusion of left front wall of thorax, initial encounter: Secondary | ICD-10-CM | POA: Diagnosis not present

## 2021-05-31 DIAGNOSIS — R519 Headache, unspecified: Secondary | ICD-10-CM | POA: Insufficient documentation

## 2021-05-31 DIAGNOSIS — Y92410 Unspecified street and highway as the place of occurrence of the external cause: Secondary | ICD-10-CM | POA: Diagnosis not present

## 2021-05-31 DIAGNOSIS — R109 Unspecified abdominal pain: Secondary | ICD-10-CM | POA: Insufficient documentation

## 2021-05-31 DIAGNOSIS — S199XXA Unspecified injury of neck, initial encounter: Secondary | ICD-10-CM | POA: Diagnosis not present

## 2021-05-31 DIAGNOSIS — S299XXA Unspecified injury of thorax, initial encounter: Secondary | ICD-10-CM | POA: Diagnosis not present

## 2021-05-31 DIAGNOSIS — S0990XA Unspecified injury of head, initial encounter: Secondary | ICD-10-CM | POA: Diagnosis not present

## 2021-05-31 DIAGNOSIS — S1093XA Contusion of unspecified part of neck, initial encounter: Secondary | ICD-10-CM | POA: Insufficient documentation

## 2021-05-31 DIAGNOSIS — S169XXA Unspecified injury of muscle, fascia and tendon at neck level, initial encounter: Secondary | ICD-10-CM | POA: Diagnosis present

## 2021-05-31 LAB — CBC
HCT: 37.6 % (ref 36.0–46.0)
Hemoglobin: 12.2 g/dL (ref 12.0–15.0)
MCH: 30.6 pg (ref 26.0–34.0)
MCHC: 32.4 g/dL (ref 30.0–36.0)
MCV: 94.2 fL (ref 80.0–100.0)
Platelets: 137 10*3/uL — ABNORMAL LOW (ref 150–400)
RBC: 3.99 MIL/uL (ref 3.87–5.11)
RDW: 12.5 % (ref 11.5–15.5)
WBC: 7 10*3/uL (ref 4.0–10.5)
nRBC: 0 % (ref 0.0–0.2)

## 2021-05-31 LAB — BASIC METABOLIC PANEL
Anion gap: 6 (ref 5–15)
BUN: 11 mg/dL (ref 6–20)
CO2: 27 mmol/L (ref 22–32)
Calcium: 8.8 mg/dL — ABNORMAL LOW (ref 8.9–10.3)
Chloride: 107 mmol/L (ref 98–111)
Creatinine, Ser: 0.73 mg/dL (ref 0.44–1.00)
GFR, Estimated: >60 mL/min (ref >60)
Glucose, Bld: 148 mg/dL — ABNORMAL HIGH (ref 70–99)
Potassium: 3.5 mmol/L (ref 3.5–5.1)
Sodium: 140 mmol/L (ref 135–145)

## 2021-05-31 LAB — PREGNANCY, URINE: Preg Test, Ur: NEGATIVE

## 2021-05-31 MED ORDER — ACETAMINOPHEN 325 MG PO TABS: 650.0000 mg | ORAL_TABLET | Freq: Four times a day (QID) | ORAL | 0 refills | Status: AC | PRN

## 2021-05-31 MED ORDER — OXYCODONE HCL 5 MG PO TABS: 5.0000 mg | ORAL_TABLET | Freq: Four times a day (QID) | ORAL | 0 refills | Status: DC | PRN

## 2021-05-31 MED ORDER — MORPHINE SULFATE (PF) 4 MG/ML IV SOLN
4.0000 mg | Freq: Once | INTRAVENOUS | Status: AC
  Administered 2021-05-31: 19:00:00 4 mg via INTRAVENOUS
  Filled 2021-05-31: qty 1

## 2021-05-31 MED ORDER — IOHEXOL 350 MG/ML SOLN
75.0000 mL | Freq: Once | INTRAVENOUS | Status: AC | PRN
  Administered 2021-05-31: 20:00:00 75 mL via INTRAVENOUS

## 2021-05-31 MED ORDER — IBUPROFEN 600 MG PO TABS: 600.0000 mg | ORAL_TABLET | Freq: Four times a day (QID) | ORAL | 0 refills | Status: AC | PRN

## 2021-05-31 MED ORDER — KETOROLAC TROMETHAMINE 30 MG/ML IJ SOLN
30.0000 mg | Freq: Once | INTRAMUSCULAR | Status: AC
  Administered 2021-05-31: 19:00:00 30 mg via INTRAVENOUS
  Filled 2021-05-31: qty 1

## 2021-05-31 NOTE — ED Notes (Signed)
Pt transported to CT ?
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
## 2021-06-01 ENCOUNTER — Telehealth: Payer: Self-pay

## 2021-06-01 NOTE — Telephone Encounter (Signed)
Transition Care Management Unsuccessful Follow-up Telephone Call  Date of discharge and from where:  05/31/2021-[facility]  Attempts:  1st Attempt  Reason for unsuccessful TCM follow-up call:  Unable to reach patient

## 2021-06-02 NOTE — Telephone Encounter (Signed)
Transition Care Management Unsuccessful Follow-up Telephone Call  Date of discharge and from where:  1/242023-[facility]  Attempts:  2nd Attempt  Reason for unsuccessful TCM follow-up call:  Unable to reach patient

## 2021-06-03 NOTE — Telephone Encounter (Signed)
Transition Care Management Unsuccessful Follow-up Telephone Call  Date of discharge and from where:  05/31/2021-[facility]  Attempts:  3rd Attempt  Reason for unsuccessful TCM follow-up call:  Unable to reach patient

## 2021-06-08 ENCOUNTER — Emergency Department (HOSPITAL_BASED_OUTPATIENT_CLINIC_OR_DEPARTMENT_OTHER)
Admission: EM | Admit: 2021-06-08 | Discharge: 2021-06-08 | Disposition: A | Discharge status: Home / Self Care | Payer: Medicaid Other | Attending: Emergency Medicine | Admitting: Emergency Medicine

## 2021-06-08 ENCOUNTER — Encounter (HOSPITAL_BASED_OUTPATIENT_CLINIC_OR_DEPARTMENT_OTHER): Payer: Self-pay | Admitting: *Deleted

## 2021-06-08 ENCOUNTER — Other Ambulatory Visit: Payer: Self-pay

## 2021-06-08 DIAGNOSIS — S199XXA Unspecified injury of neck, initial encounter: Secondary | ICD-10-CM | POA: Diagnosis not present

## 2021-06-08 DIAGNOSIS — Z419 Encounter for procedure for purposes other than remedying health state, unspecified: Secondary | ICD-10-CM | POA: Diagnosis not present

## 2021-06-08 DIAGNOSIS — M542 Cervicalgia: Secondary | ICD-10-CM | POA: Diagnosis not present

## 2021-06-08 MED ORDER — OXYCODONE-ACETAMINOPHEN 5-325 MG PO TABS
1.0000 | ORAL_TABLET | Freq: Once | ORAL | Status: AC
  Administered 2021-06-08: 1 via ORAL
  Filled 2021-06-08: qty 1

## 2021-06-08 MED ORDER — LIDOCAINE 5 % EX PTCH
1.0000 | MEDICATED_PATCH | CUTANEOUS | Status: DC
  Administered 2021-06-08: 1 via TRANSDERMAL
  Filled 2021-06-08: qty 1

## 2021-06-08 MED ORDER — OXYCODONE-ACETAMINOPHEN 5-325 MG PO TABS: 1.0000 | ORAL_TABLET | Freq: Four times a day (QID) | ORAL | 0 refills | Status: DC | PRN

## 2021-06-08 MED ORDER — LIDOCAINE 5 % EX PTCH: 1.0000 | MEDICATED_PATCH | CUTANEOUS | 0 refills | Status: DC
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
## 2021-06-09 ENCOUNTER — Telehealth: Payer: Self-pay | Admitting: *Deleted

## 2021-06-09 NOTE — Telephone Encounter (Signed)
Transition Care Management Follow-up Telephone Call Date of discharge and from where: 06/08/2021 - [place] How have you been since you were released from the hospital? "I am okay" Any questions or concerns? No  Items Reviewed: Did the pt receive and understand the discharge instructions provided? Yes  Medications obtained and verified? Yes  Other? No  Any new allergies since your discharge? No  Dietary orders reviewed? No Do you have support at home? Yes    Functional Questionnaire: (I = Independent and D = Dependent) ADLs: I  Bathing/Dressing- I  Meal Prep- I  Eating- I  Maintaining continence- I  Transferring/Ambulation- I  Managing Meds- I  Follow up appointments reviewed:  PCP Hospital f/u appt confirmed? No   - PCP not [facility] f/u appt confirmed? No   Are transportation arrangements needed? No  If their condition worsens, is the pt aware to call PCP or go to the Emergency Dept.? Yes Was the patient provided with contact information for the PCP's office or ED? Yes Was to pt encouraged to call back with questions or concerns? Yes

## 2021-07-06 DIAGNOSIS — Z419 Encounter for procedure for purposes other than remedying health state, unspecified: Secondary | ICD-10-CM | POA: Diagnosis not present

## 2021-08-06 DIAGNOSIS — Z419 Encounter for procedure for purposes other than remedying health state, unspecified: Secondary | ICD-10-CM | POA: Diagnosis not present

## 2021-09-05 DIAGNOSIS — Z419 Encounter for procedure for purposes other than remedying health state, unspecified: Secondary | ICD-10-CM | POA: Diagnosis not present

## 2021-10-06 DIAGNOSIS — Z419 Encounter for procedure for purposes other than remedying health state, unspecified: Secondary | ICD-10-CM | POA: Diagnosis not present

## 2021-11-03 DIAGNOSIS — L0291 Cutaneous abscess, unspecified: Secondary | ICD-10-CM | POA: Diagnosis not present

## 2021-11-05 DIAGNOSIS — Z419 Encounter for procedure for purposes other than remedying health state, unspecified: Secondary | ICD-10-CM | POA: Diagnosis not present

## 2021-11-15 DIAGNOSIS — J029 Acute pharyngitis, unspecified: Secondary | ICD-10-CM | POA: Diagnosis not present

## 2021-11-20 ENCOUNTER — Other Ambulatory Visit: Payer: Self-pay

## 2021-11-20 ENCOUNTER — Encounter (HOSPITAL_BASED_OUTPATIENT_CLINIC_OR_DEPARTMENT_OTHER): Payer: Self-pay | Admitting: Emergency Medicine

## 2021-11-20 DIAGNOSIS — H66002 Acute suppurative otitis media without spontaneous rupture of ear drum, left ear: Secondary | ICD-10-CM | POA: Diagnosis not present

## 2021-11-20 DIAGNOSIS — H9202 Otalgia, left ear: Secondary | ICD-10-CM | POA: Diagnosis present

## 2021-11-20 DIAGNOSIS — R059 Cough, unspecified: Secondary | ICD-10-CM | POA: Insufficient documentation

## 2021-11-20 DIAGNOSIS — J029 Acute pharyngitis, unspecified: Secondary | ICD-10-CM | POA: Diagnosis not present

## 2021-11-20 DIAGNOSIS — Z20822 Contact with and (suspected) exposure to covid-19: Secondary | ICD-10-CM | POA: Diagnosis not present

## 2021-11-20 LAB — RESP PANEL BY RT-PCR (FLU A&B, COVID) ARPGX2
Influenza A by PCR: NEGATIVE
Influenza B by PCR: NEGATIVE
SARS Coronavirus 2 by RT PCR: NEGATIVE

## 2021-11-20 LAB — GROUP A STREP BY PCR: Group A Strep by PCR: NOT DETECTED

## 2021-11-20 NOTE — ED Triage Notes (Signed)
Sore throat and cough x 1 week. Family member has strep throat and scarlet fever. Was seen at urgent care 5 days ago and teste negative for strep and has been taking tessalon pearls. No improvement with cough. States cough is getting worse and causing headaches.

## 2021-11-21 ENCOUNTER — Emergency Department (HOSPITAL_BASED_OUTPATIENT_CLINIC_OR_DEPARTMENT_OTHER)
Admission: EM | Admit: 2021-11-21 | Discharge: 2021-11-21 | Disposition: A | Discharge status: Home / Self Care | Payer: Medicaid Other | Attending: Emergency Medicine | Admitting: Emergency Medicine

## 2021-11-21 DIAGNOSIS — H66002 Acute suppurative otitis media without spontaneous rupture of ear drum, left ear: Secondary | ICD-10-CM

## 2021-11-21 MED ORDER — AMOXICILLIN-POT CLAVULANATE 875-125 MG PO TABS
1.0000 | ORAL_TABLET | Freq: Once | ORAL | Status: AC
  Administered 2021-11-21: 1 via ORAL
  Filled 2021-11-21: qty 1

## 2021-11-21 MED ORDER — AMOXICILLIN-POT CLAVULANATE 875-125 MG PO TABS: 1.0000 | ORAL_TABLET | Freq: Two times a day (BID) | ORAL | 0 refills | Status: DC

## 2021-11-21 MED ORDER — DEXAMETHASONE SODIUM PHOSPHATE 10 MG/ML IJ SOLN
10.0000 mg | Freq: Once | INTRAMUSCULAR | Status: AC
  Administered 2021-11-21: 10 mg via INTRAMUSCULAR
  Filled 2021-11-21: qty 1

## 2021-11-21 NOTE — Discharge Instructions (Addendum)
You were seen today for sore throat and ear pain.  You do have evidence of an ear infection.  You will be treated with antibiotics.  Start Flonase to help drain the middle ear.  Take ibuprofen as needed for any pain.

## 2021-11-21 NOTE — ED Provider Notes (Signed)
[facility] EMERGENCY DEPT Provider Note   CSN: [id number] Arrival date & time: 11/20/21  2206     History  Chief Complaint  Patient presents with   Sore Throat   Cough    Shelly Andrews is a 41 y.o. female.  HPI     This is a 41 year old female who presents with sore throat, cough, ear pain.  Patient reports that approximately 10 days ago she was [place] the beach and developed sore throat and laryngitis without voice.  She states that she was sleeping next to an ear event and felt like that was the cause.  However, she later found out that she had a known sick contact because her niece had strep throat and scarlet fever.  Patient has had ongoing sore throat.  She states that it radiates into her left ear and she is having significant left ear pain.  She also has a nonproductive cough.  She is a non-smoker.  Patient was seen and evaluated at urgent care.  She tested negative for strep throat.  She was given Tessalon Perle for cough which has not helped.  Home Medications Prior to Admission medications   Medication Sig Start Date End Date Taking? Authorizing Provider  amoxicillin-clavulanate (AUGMENTIN) 875-125 MG tablet Take 1 tablet by mouth every 12 (twelve) hours. 11/21/21  Yes Althea Backs, Mayer Masker, MD  acetaminophen (TYLENOL) 500 MG tablet Take 1,000 mg by mouth every 6 (six) hours as needed for mild pain or headache.     [provider]  Docosahexaenoic Acid (PRENATAL DHA) 200 MG CAPS Take 1 capsule by mouth daily.     [provider]  ibuprofen (ADVIL) 600 MG tablet Take 1 tablet (600 mg total) by mouth every 6 (six) hours. 12/22/19   Morris, Megan, DO  lidocaine (LIDODERM) 5 % Place 1 patch onto the skin daily. Remove & Discard patch within 12 hours or as directed by MD 06/08/21   Cheryll Cockayne, MD  oxyCODONE (ROXICODONE) 5 MG immediate release tablet Take 1 tablet (5 mg total) by mouth every 6 (six) hours as needed for up to 10 doses for severe pain.  05/31/21   Terald Sleeper, MD  oxyCODONE-acetaminophen (PERCOCET/ROXICET) 5-325 MG tablet Take 1 tablet by mouth every 6 (six) hours as needed for up to 8 doses for severe pain. 06/08/21   Cheryll Cockayne, MD  pantoprazole (PROTONIX) 40 MG tablet Take 1 tablet (40 mg total) by mouth daily. 10/07/19   Judeth Horn, NP  triamcinolone cream (KENALOG) 0.1 % Apply 1 application topically 2 (two) times daily. 01/27/20   Maxwell Caul, PA-C      Allergies    Zoloft [sertraline hcl] and Sulfa antibiotics    Review of Systems   Review of Systems  Constitutional:  Negative for fever.  HENT:  Positive for ear pain and sore throat.   Respiratory:  Positive for cough.   All other systems reviewed and are negative.   Physical Exam Updated Vital Signs BP 110/89 (BP Location: Right Arm)   Pulse 86   Temp 98.1 F (36.7 C)   Resp 20   Ht 1.6 m (5\' 3" )   Wt 63.5 kg   SpO2 100%   BMI 24.80 kg/m  Physical Exam Vitals and nursing note reviewed.  Constitutional:      Appearance: She is well-developed. She is not ill-appearing.  HENT:     Head: Normocephalic and atraumatic.     Right Ear: Tympanic membrane normal.  Ears:     Comments: Left TM with purulent effusion and erythema, bulging    Nose: Congestion present.     Mouth/Throat:     Comments: Uvula midline, no exudate noted, no tonsillar swelling Eyes:     Pupils: Pupils are equal, round, and reactive to light.  Cardiovascular:     Rate and Rhythm: Normal rate and regular rhythm.     Heart sounds: Normal heart sounds.  Pulmonary:     Effort: Pulmonary effort is normal. No respiratory distress.     Breath sounds: No wheezing.  Abdominal:     General: Bowel sounds are normal.     Palpations: Abdomen is soft.  Musculoskeletal:     Cervical back: Neck supple.  Lymphadenopathy:     Cervical: Cervical adenopathy present.  Skin:    General: Skin is warm and dry.  Neurological:     Mental Status: She is alert and oriented to  person, place, and time.  Psychiatric:        Mood and Affect: Mood normal.     ED Results / Procedures / Treatments   Labs (all labs ordered are listed, but only abnormal results are displayed) Labs Reviewed  RESP PANEL BY RT-PCR (FLU A&B, COVID) ARPGX2  GROUP A STREP BY PCR    EKG None  Radiology No results found.  Procedures Procedures    Medications Ordered in ED Medications  dexamethasone (DECADRON) injection 10 mg (has no administration in time range)  amoxicillin-clavulanate (AUGMENTIN) 875-125 MG per tablet 1 tablet (has no administration in time range)    ED Course/ Medical Decision Making/ A&P                           Medical Decision Making Risk Prescription drug management.   This patient presents to the ED for concern of sore throat, cough, ear pain, this involves an extensive number of treatment options, and is a complaint that carries with it a high risk of complications and morbidity.  I considered the following differential and admission for this acute, potentially life threatening condition.  The differential diagnosis includes upper respiratory virus, COVID, strep, pneumonia, acute otitis media  MDM:    This is a 41 year old female who presents with upper respiratory symptoms.  She is nontoxic and vital signs reassuring.  She is afebrile.  She has evidence of acute otitis media on exam.  She is strep negative.  She is COVID-negative.  Breath sounds are clear.  Given the duration of her symptoms, would elect to treat with antibiotics although I feel that likely started as a viral infection.  Given that she will be treated with Augmentin, this would cover for typical pneumonia.  Patient was also given a dose of Decadron that should help with bronchial inflammation and any posterior oropharyngeal swelling that was noted on exam.  Recommend addition of Flonase.  (Labs, imaging, consults)  Labs: I Ordered, and personally interpreted labs.  The pertinent  results include: Strep screen, COVID screen  Imaging Studies ordered: I ordered imaging studies including none I independently visualized and interpreted imaging. I agree with the radiologist interpretation  Additional history obtained from family at bedside.  External records from outside source obtained and reviewed including prior evaluations  Cardiac Monitoring: The patient was maintained on a cardiac monitor.  I personally viewed and interpreted the cardiac monitored which showed an underlying rhythm of: Normal sinus rhythm  Reevaluation: After the interventions noted above, I  reevaluated the patient and found that they have :stayed the same  Social Determinants of Health: Lives independently  Disposition: Discharge  Co morbidities that complicate the patient evaluation  Past Medical History:  Diagnosis Date   Depression    Splenomegaly 04/16/2013   Thrombocytopenia ([place])    Idopathic with pregnancy     Medicines Meds ordered this encounter  Medications   dexamethasone (DECADRON) injection 10 mg   amoxicillin-clavulanate (AUGMENTIN) 875-125 MG per tablet 1 tablet   amoxicillin-clavulanate (AUGMENTIN) 875-125 MG tablet    Sig: Take 1 tablet by mouth every 12 (twelve) hours.    Dispense:  20 tablet    Refill:  0    I have reviewed the patients home medicines and have made adjustments as needed  Problem List / ED Course: Problem List Items Addressed This Visit   None Visit Diagnoses     Non-recurrent acute suppurative otitis media of left ear without spontaneous rupture of tympanic membrane    -  Primary   Relevant Medications   amoxicillin-clavulanate (AUGMENTIN) 875-125 MG per tablet 1 tablet (Start on 11/21/2021  1:00 AM)   amoxicillin-clavulanate (AUGMENTIN) 875-125 MG tablet                   Final Clinical Impression(s) / ED Diagnoses Final diagnoses:  Non-recurrent acute suppurative otitis media of left ear without spontaneous rupture of  tympanic membrane    Rx / DC Orders ED Discharge Orders          Ordered    amoxicillin-clavulanate (AUGMENTIN) 875-125 MG tablet  Every 12 hours        11/21/21 0053              Merryl Hacker, MD 11/21/21 [phone]

## 2021-12-06 DIAGNOSIS — Z419 Encounter for procedure for purposes other than remedying health state, unspecified: Secondary | ICD-10-CM | POA: Diagnosis not present

## 2021-12-22 DIAGNOSIS — M7711 Lateral epicondylitis, right elbow: Secondary | ICD-10-CM | POA: Diagnosis not present

## 2021-12-22 DIAGNOSIS — H1031 Unspecified acute conjunctivitis, right eye: Secondary | ICD-10-CM | POA: Diagnosis not present

## 2021-12-23 ENCOUNTER — Telehealth: Payer: Medicaid Other | Admitting: Family Medicine

## 2021-12-23 DIAGNOSIS — U071 COVID-19: Secondary | ICD-10-CM

## 2021-12-23 MED ORDER — CYCLOBENZAPRINE HCL 10 MG PO TABS: 10.0000 mg | ORAL_TABLET | Freq: Three times a day (TID) | ORAL | 0 refills | Status: AC | PRN

## 2021-12-23 MED ORDER — CYCLOBENZAPRINE HCL 10 MG PO TABS: 10.0000 mg | ORAL_TABLET | Freq: Three times a day (TID) | ORAL | 0 refills | Status: DC | PRN

## 2021-12-23 NOTE — Addendum Note (Signed)
Addended by: Georgana Curio on: 12/23/2021 01:00 PM   Modules accepted: Orders

## 2021-12-23 NOTE — Progress Notes (Signed)
E-Visit  for Positive Covid Test Result  We are sorry you are not feeling well. We are here to help!  You have tested positive for COVID-19, meaning that you were infected with the novel coronavirus and could give the virus to others.  It is vitally important that you stay home so you do not spread it to others.      Please continue isolation at home, for at least 10 days since the start of your symptoms and until you have had 24 hours with no fever (without taking a fever reducer) and with improving of symptoms.  If you have no symptoms but tested positive (or all symptoms resolve after 5 days and you have no fever) you can leave your house but continue to wear a mask around others for an additional 5 days. If you have a fever,continue to stay home until you have had 24 hours of no fever. Most cases improve 5-10 days from onset but we have seen a small number of patients who have gotten worse after the 10 days.  Please be sure to watch for worsening symptoms and remain taking the proper precautions.   Go to the nearest hospital ED for assessment if fever/cough/breathlessness are severe or illness seems like a threat to life.    The following symptoms may appear 2-14 days after exposure: Fever Cough Shortness of breath or difficulty breathing Chills Repeated shaking with chills Muscle pain Headache Sore throat New loss of taste or smell Fatigue Congestion or runny nose Nausea or vomiting Diarrhea  You have been enrolled in MyChart Home Monitoring for COVID-19. Daily you will receive a questionnaire within the MyChart website. Our COVID-19 response team will be monitoring your responses daily.  You can use medication such as ibuprofen or tylenol otc.   You may also take acetaminophen (Tylenol) as needed for fever.  HOME CARE: Only take medications as instructed by your medical team. Drink plenty of fluids and get plenty of rest. A steam or ultrasonic humidifier can help if you  have congestion.   GET HELP RIGHT AWAY IF YOU HAVE EMERGENCY WARNING SIGNS.  Call 911 or proceed to your closest emergency facility if: You develop worsening high fever. Trouble breathing Bluish lips or face Persistent pain or pressure in the chest New confusion Inability to wake or stay awake You cough up blood. Your symptoms become more severe Inability to hold down food or fluids  This list is not all possible symptoms. Contact your medical provider for any symptoms that are severe or concerning to you.    Your e-visit answers were reviewed by a board certified advanced clinical practitioner to complete your personal care plan.  Depending on the condition, your plan could have included both over the counter or prescription medications.  If there is a problem please reply once you have received a response from your provider.  Your safety is important to [place].  If you have drug allergies check your prescription carefully.    You can use MyChart to ask questions about today's visit, request a non-urgent call back, or ask for a work or school excuse for 24 hours related to this e-Visit. If it has been greater than 24 hours you will need to follow up with your provider, or enter a new e-Visit to address those concerns. You will get an e-mail in the next two days asking about your experience.  I hope that your e-visit has been valuable and will speed your recovery. Thank  you for using e-visits.

## 2021-12-28 DIAGNOSIS — U071 COVID-19: Secondary | ICD-10-CM | POA: Diagnosis not present

## 2022-01-06 DIAGNOSIS — Z419 Encounter for procedure for purposes other than remedying health state, unspecified: Secondary | ICD-10-CM | POA: Diagnosis not present

## 2022-01-18 ENCOUNTER — Telehealth: Payer: Medicaid Other | Admitting: Physician Assistant

## 2022-01-18 DIAGNOSIS — R112 Nausea with vomiting, unspecified: Secondary | ICD-10-CM

## 2022-01-18 MED ORDER — ONDANSETRON 4 MG PO TBDP: 4.0000 mg | ORAL_TABLET | Freq: Three times a day (TID) | ORAL | 0 refills | Status: AC | PRN

## 2022-01-18 NOTE — Progress Notes (Signed)
I have spent 5 minutes in review of e-visit questionnaire, review and updating patient chart, medical decision making and response to patient.   Clotilda Hafer Cody Adrine Hayworth, PA-C    

## 2022-01-18 NOTE — Progress Notes (Signed)
E-Visit for Vomiting  We are sorry that you are not feeling well. Here is how we plan to help!   Vomiting is the forceful emptying of a portion of the stomach's content through the mouth.  Although nausea and vomiting can make you feel miserable, it's important to remember that these are not diseases, but rather symptoms of an underlying illness.  When we treat short term symptoms, we always caution that any symptoms that persist should be fully evaluated in a medical office.  I have prescribed a medication that will help alleviate your symptoms and allow you to stay hydrated:  Zofran 4 mg 1 tablet every 8 hours as needed for nausea and vomiting  HOME CARE: Drink clear liquids.  This is very important! Dehydration (the lack of fluid) can lead to a serious complication.  Start off with 1 tablespoon every 5 minutes for 8 hours. You may begin eating bland foods after 8 hours without vomiting.  Start with saltine crackers, white bread, rice, mashed potatoes, applesauce. After 48 hours on a bland diet, you may resume a normal diet. Try to go to sleep.  Sleep often empties the stomach and relieves the need to vomit.  GET HELP RIGHT AWAY IF:  Your symptoms do not improve or worsen within 2 days after treatment. You have a fever for over 3 days. You cannot keep down fluids after trying the medication.  MAKE SURE YOU:  Understand these instructions. Will watch your condition. Will get help right away if you are not doing well or get worse.   Thank you for choosing an e-visit.  Your e-visit answers were reviewed by a board certified advanced clinical practitioner to complete your personal care plan. Depending upon the condition, your plan could have included both over the counter or prescription medications.  Please review your pharmacy choice. Make sure the pharmacy is open so you can pick up prescription now. If there is a problem, you may contact your provider through MyChart messaging and  have the prescription routed to another pharmacy.  Your safety is important to us. If you have drug allergies check your prescription carefully.   For the next 24 hours you can use MyChart to ask questions about today's visit, request a non-urgent call back, or ask for a work or school excuse. You will get an email in the next two days asking about your experience. I hope that your e-visit has been valuable and will speed your recovery.  

## 2022-02-05 DIAGNOSIS — Z419 Encounter for procedure for purposes other than remedying health state, unspecified: Secondary | ICD-10-CM | POA: Diagnosis not present

## 2022-02-10 DIAGNOSIS — H5213 Myopia, bilateral: Secondary | ICD-10-CM | POA: Diagnosis not present

## 2022-02-23 DIAGNOSIS — H5213 Myopia, bilateral: Secondary | ICD-10-CM | POA: Diagnosis not present

## 2022-02-23 DIAGNOSIS — H52223 Regular astigmatism, bilateral: Secondary | ICD-10-CM | POA: Diagnosis not present

## 2022-03-08 DIAGNOSIS — Z419 Encounter for procedure for purposes other than remedying health state, unspecified: Secondary | ICD-10-CM | POA: Diagnosis not present

## 2022-04-07 DIAGNOSIS — Z419 Encounter for procedure for purposes other than remedying health state, unspecified: Secondary | ICD-10-CM | POA: Diagnosis not present

## 2022-05-08 DIAGNOSIS — Z419 Encounter for procedure for purposes other than remedying health state, unspecified: Secondary | ICD-10-CM | POA: Diagnosis not present

## 2022-06-08 DIAGNOSIS — Z419 Encounter for procedure for purposes other than remedying health state, unspecified: Secondary | ICD-10-CM | POA: Diagnosis not present

## 2022-07-07 DIAGNOSIS — Z419 Encounter for procedure for purposes other than remedying health state, unspecified: Secondary | ICD-10-CM | POA: Diagnosis not present

## 2022-08-07 DIAGNOSIS — Z419 Encounter for procedure for purposes other than remedying health state, unspecified: Secondary | ICD-10-CM | POA: Diagnosis not present

## 2022-08-28 DIAGNOSIS — B349 Viral infection, unspecified: Secondary | ICD-10-CM | POA: Diagnosis not present

## 2022-08-28 DIAGNOSIS — R0981 Nasal congestion: Secondary | ICD-10-CM | POA: Diagnosis not present

## 2022-09-06 DIAGNOSIS — Z419 Encounter for procedure for purposes other than remedying health state, unspecified: Secondary | ICD-10-CM | POA: Diagnosis not present

## 2022-10-07 DIAGNOSIS — Z419 Encounter for procedure for purposes other than remedying health state, unspecified: Secondary | ICD-10-CM | POA: Diagnosis not present

## 2022-10-09 ENCOUNTER — Encounter (HOSPITAL_BASED_OUTPATIENT_CLINIC_OR_DEPARTMENT_OTHER): Payer: Self-pay

## 2022-10-09 ENCOUNTER — Other Ambulatory Visit: Payer: Self-pay

## 2022-10-09 DIAGNOSIS — N3000 Acute cystitis without hematuria: Secondary | ICD-10-CM | POA: Insufficient documentation

## 2022-10-09 DIAGNOSIS — R109 Unspecified abdominal pain: Secondary | ICD-10-CM | POA: Diagnosis present

## 2022-10-09 DIAGNOSIS — M5459 Other low back pain: Secondary | ICD-10-CM | POA: Diagnosis not present

## 2022-10-09 DIAGNOSIS — M545 Low back pain, unspecified: Secondary | ICD-10-CM | POA: Insufficient documentation

## 2022-10-09 LAB — URINALYSIS, ROUTINE W REFLEX MICROSCOPIC
Bacteria, UA: NONE SEEN
Bilirubin Urine: NEGATIVE
Glucose, UA: NEGATIVE mg/dL
Ketones, ur: NEGATIVE mg/dL
Nitrite: NEGATIVE
Specific Gravity, Urine: 1.028 (ref 1.005–1.030)
pH: 5.5 (ref 5.0–8.0)

## 2022-10-09 LAB — CBC WITH DIFFERENTIAL/PLATELET
Abs Immature Granulocytes: 0.02 10*3/uL (ref 0.00–0.07)
Basophils Absolute: 0 10*3/uL (ref 0.0–0.1)
Basophils Relative: 1 %
Eosinophils Absolute: 0.1 10*3/uL (ref 0.0–0.5)
Eosinophils Relative: 2 %
HCT: 38.5 % (ref 36.0–46.0)
Hemoglobin: 13.3 g/dL (ref 12.0–15.0)
Immature Granulocytes: 0 %
Lymphocytes Relative: 39 %
Lymphs Abs: 2.4 10*3/uL (ref 0.7–4.0)
MCH: 31.9 pg (ref 26.0–34.0)
MCHC: 34.5 g/dL (ref 30.0–36.0)
MCV: 92.3 fL (ref 80.0–100.0)
Monocytes Absolute: 0.4 10*3/uL (ref 0.1–1.0)
Monocytes Relative: 6 %
Neutro Abs: 3.2 10*3/uL (ref 1.7–7.7)
Neutrophils Relative %: 52 %
Platelets: 138 10*3/uL — ABNORMAL LOW (ref 150–400)
RBC: 4.17 MIL/uL (ref 3.87–5.11)
RDW: 12.6 % (ref 11.5–15.5)
WBC: 6.1 10*3/uL (ref 4.0–10.5)
nRBC: 0 % (ref 0.0–0.2)

## 2022-10-09 LAB — BASIC METABOLIC PANEL
Anion gap: 9 (ref 5–15)
BUN: 15 mg/dL (ref 6–20)
CO2: 24 mmol/L (ref 22–32)
Calcium: 9.1 mg/dL (ref 8.9–10.3)
Chloride: 106 mmol/L (ref 98–111)
Creatinine, Ser: 0.9 mg/dL (ref 0.44–1.00)
GFR, Estimated: >60 mL/min (ref >60)
Glucose, Bld: 94 mg/dL (ref 70–99)
Potassium: 3.6 mmol/L (ref 3.5–5.1)
Sodium: 139 mmol/L (ref 135–145)

## 2022-10-09 LAB — PREGNANCY, URINE: Preg Test, Ur: NEGATIVE

## 2022-10-09 NOTE — ED Provider Triage Note (Signed)
Emergency Medicine Provider Triage Evaluation Note  Shelly Andrews , a 42 y.o. female  was evaluated in triage.  Pt complains of L flank pain that started this morning. No dysuria, hematuria. Hx similar symptoms with UTIs. No recent antibiotics. No fever, n/v.  Review of Systems  Positive: See HPI Negative: See HPI  Physical Exam  BP 110/77 (BP Location: Right Arm)   Pulse 78   Temp 98 F (36.7 C) (Oral)   Resp 18   Ht 5\' 3"  (1.6 m)   Wt 63.5 kg   SpO2 100%   BMI 24.80 kg/m  Gen:   Awake, no distress   Resp:  Normal effort  MSK:   Moves extremities without difficulty  Other:  LLQ ttp, L CVA ttp, abdomen soft, non-distended, non-toxic appearing  Medical Decision Making  Medically screening exam initiated at 10:16 PM.  Appropriate orders placed.  Shelly Andrews was informed that the remainder of the evaluation will be completed by another provider, this initial triage assessment does not replace that evaluation, and the importance of remaining in the ED until their evaluation is complete.     Tonette Lederer, PA-C 10/09/22 2218

## 2022-10-09 NOTE — ED Triage Notes (Signed)
Patient here POV from Home.  Endorses since this AM some Left Flank pain.   No Known fevers. Some nausea. No emesis or Diarrhea. No Discernable Dysuria.   NAD Noted during triage. A&Ox4. GCS 15. Ambulatory.

## 2022-10-10 ENCOUNTER — Emergency Department (HOSPITAL_BASED_OUTPATIENT_CLINIC_OR_DEPARTMENT_OTHER): Payer: Medicaid Other

## 2022-10-10 ENCOUNTER — Emergency Department (HOSPITAL_BASED_OUTPATIENT_CLINIC_OR_DEPARTMENT_OTHER)
Admission: EM | Admit: 2022-10-10 | Discharge: 2022-10-10 | Disposition: A | Discharge status: Home / Self Care | Payer: Medicaid Other | Attending: Emergency Medicine | Admitting: Emergency Medicine

## 2022-10-10 DIAGNOSIS — M545 Low back pain, unspecified: Secondary | ICD-10-CM

## 2022-10-10 DIAGNOSIS — R109 Unspecified abdominal pain: Secondary | ICD-10-CM | POA: Diagnosis not present

## 2022-10-10 DIAGNOSIS — N3000 Acute cystitis without hematuria: Secondary | ICD-10-CM

## 2022-10-10 MED ORDER — KETOROLAC TROMETHAMINE 30 MG/ML IJ SOLN
30.0000 mg | Freq: Once | INTRAMUSCULAR | Status: AC
  Administered 2022-10-10: 30 mg via INTRAVENOUS
  Filled 2022-10-10: qty 1

## 2022-10-10 MED ORDER — SODIUM CHLORIDE 0.9 % IV BOLUS
1000.0000 mL | Freq: Once | INTRAVENOUS | Status: AC
  Administered 2022-10-10: 1000 mL via INTRAVENOUS

## 2022-10-10 MED ORDER — IBUPROFEN 600 MG PO TABS: 600.0000 mg | ORAL_TABLET | Freq: Four times a day (QID) | ORAL | 0 refills | Status: AC

## 2022-10-10 MED ORDER — LIDOCAINE 5 % EX PTCH: 1.0000 | MEDICATED_PATCH | CUTANEOUS | 0 refills | Status: AC

## 2022-10-10 MED ORDER — SODIUM CHLORIDE 0.9 % IV SOLN
1.0000 g | Freq: Once | INTRAVENOUS | Status: AC
  Administered 2022-10-10: 1 g via INTRAVENOUS
  Filled 2022-10-10: qty 10

## 2022-10-10 MED ORDER — CEPHALEXIN 500 MG PO CAPS: 500.0000 mg | ORAL_CAPSULE | Freq: Two times a day (BID) | ORAL | 0 refills | Status: AC

## 2022-10-10 NOTE — ED Notes (Signed)
RN reviewed discharge instructions with pt. Pt verbalized understanding and had no further questions. VSS upon discharge.  

## 2022-10-10 NOTE — ED Provider Notes (Signed)
[place] EMERGENCY DEPARTMENT AT [facility]  Provider Note  CSN: [id number] Arrival date & time: 10/09/22 2204  History Chief Complaint  Patient presents with   Flank Pain    Shelly Andrews is a 42 y.o. female reports intermittent L flank pain started this morning and has been bothering her off and on all day. She has not had any fever, N/V/D or dysuria. No hematuria or frequency. She did a home UTI test and it was positive.   Home Medications Prior to Admission medications   Medication Sig Start Date End Date Taking? Authorizing Provider  cephALEXin (KEFLEX) 500 MG capsule Take 1 capsule (500 mg total) by mouth 2 (two) times daily for 7 days. 10/10/22 10/17/22 Yes Pollyann Savoy, MD  acetaminophen (TYLENOL) 500 MG tablet Take 1,000 mg by mouth every 6 (six) hours as needed for mild pain or headache.     [provider]  Docosahexaenoic Acid (PRENATAL DHA) 200 MG CAPS Take 1 capsule by mouth daily.     [provider]  ibuprofen (ADVIL) 600 MG tablet Take 1 tablet (600 mg total) by mouth every 6 (six) hours. 10/10/22   Pollyann Savoy, MD  lidocaine (LIDODERM) 5 % Place 1 patch onto the skin daily. Remove & Discard patch within 12 hours or as directed by MD 10/10/22   Pollyann Savoy, MD  ondansetron (ZOFRAN-ODT) 4 MG disintegrating tablet Take 1 tablet (4 mg total) by mouth every 8 (eight) hours as needed for nausea or vomiting. 01/18/22   Waldon Merl, PA-C  pantoprazole (PROTONIX) 40 MG tablet Take 1 tablet (40 mg total) by mouth daily. 10/07/19   Judeth Horn, NP  triamcinolone cream (KENALOG) 0.1 % Apply 1 application topically 2 (two) times daily. 01/27/20   Maxwell Caul, PA-C     Allergies    Zoloft [sertraline hcl] and Sulfa antibiotics   Review of Systems   Review of Systems Please see HPI for pertinent positives and negatives  Physical Exam BP 106/66   Pulse 87   Temp 98.1 F (36.7 C) (Oral)   Resp 17   Ht 5\' 3"  (1.6 m)    Wt 63.5 kg   SpO2 100%   BMI 24.80 kg/m   Physical Exam Vitals and nursing note reviewed.  Constitutional:      Appearance: Normal appearance.  HENT:     Head: Normocephalic and atraumatic.     Nose: Nose normal.     Mouth/Throat:     Mouth: Mucous membranes are moist.  Eyes:     Extraocular Movements: Extraocular movements intact.     Conjunctiva/sclera: Conjunctivae normal.  Cardiovascular:     Rate and Rhythm: Normal rate.  Pulmonary:     Effort: Pulmonary effort is normal.     Breath sounds: Normal breath sounds.  Abdominal:     General: Abdomen is flat.     Palpations: Abdomen is soft.     Tenderness: There is no abdominal tenderness. There is left CVA tenderness. There is no right CVA tenderness.  Musculoskeletal:        General: No swelling. Normal range of motion.     Cervical back: Neck supple.  Skin:    General: Skin is warm and dry.  Neurological:     General: No focal deficit present.     Mental Status: She is alert.  Psychiatric:        Mood and Affect: Mood normal.     ED Results /  Procedures / Treatments   EKG None  Procedures Procedures  Medications Ordered in the ED Medications  cefTRIAXone (ROCEPHIN) 1 g in sodium chloride 0.9 % 100 mL IVPB (1 g Intravenous New Bag/Given 10/10/22 0238)  ketorolac (TORADOL) 30 MG/ML injection 30 mg (30 mg Intravenous Given 10/10/22 0217)  sodium chloride 0.9 % bolus 1,000 mL (1,000 mLs Intravenous New Bag/Given 10/10/22 0237)    Initial Impression and Plan  Patient here with L flank pain. Has had similar with UTI in the past, but not having any other urinary symptoms today. Symptoms may be due to renal colic. Labs done in triage show unremarkable CBC and CMP. UA is consistent with UTI. Will check CT for stones. Toradol for pain. Rocephin for UTI.   ED Course   Clinical Course as of 10/10/22 0246  Tue Oct 10, 2022  0244 I personally viewed the images from radiology studies and agree with radiologist  interpretation: CT is neg for stone. Will plan discharge with Rx for Keflex, Naprosyn, PCP follow up, RTED for any worsening pain, fever, vomiting or other concerns.   [CS]    Clinical Course User Index [CS] Pollyann Savoy, MD     MDM Rules/Calculators/A&P Medical Decision Making Problems Addressed: Acute bilateral low back pain without sciatica: acute illness or injury Acute cystitis without hematuria: acute illness or injury  Amount and/or Complexity of Data Reviewed Labs: ordered. Decision-making details documented in ED Course. Radiology: ordered and independent interpretation performed. Decision-making details documented in ED Course.  Risk Prescription drug management.     Final Clinical Impression(s) / ED Diagnoses Final diagnoses:  Acute cystitis without hematuria  Acute bilateral low back pain without sciatica    Rx / DC Orders ED Discharge Orders          Ordered    cephALEXin (KEFLEX) 500 MG capsule  2 times daily        10/10/22 0245    ibuprofen (ADVIL) 600 MG tablet  Every 6 hours        10/10/22 0245    lidocaine (LIDODERM) 5 %  Every 24 hours        10/10/22 0245             Pollyann Savoy, MD 10/10/22 [phone]

## 2022-10-11 LAB — URINE CULTURE

## 2022-11-06 DIAGNOSIS — Z419 Encounter for procedure for purposes other than remedying health state, unspecified: Secondary | ICD-10-CM | POA: Diagnosis not present

## 2022-11-27 ENCOUNTER — Telehealth: Payer: Self-pay

## 2022-11-27 NOTE — Telephone Encounter (Signed)
LVM for patient to call back [phone], or to call PCP office to schedule follow up apt. AS, CMA  

## 2022-12-07 DIAGNOSIS — Z419 Encounter for procedure for purposes other than remedying health state, unspecified: Secondary | ICD-10-CM | POA: Diagnosis not present

## 2023-01-07 DIAGNOSIS — Z419 Encounter for procedure for purposes other than remedying health state, unspecified: Secondary | ICD-10-CM | POA: Diagnosis not present

## 2023-02-06 DIAGNOSIS — Z419 Encounter for procedure for purposes other than remedying health state, unspecified: Secondary | ICD-10-CM | POA: Diagnosis not present

## 2023-03-09 DIAGNOSIS — Z419 Encounter for procedure for purposes other than remedying health state, unspecified: Secondary | ICD-10-CM | POA: Diagnosis not present

## 2023-04-08 DIAGNOSIS — Z419 Encounter for procedure for purposes other than remedying health state, unspecified: Secondary | ICD-10-CM | POA: Diagnosis not present

## 2023-05-09 DIAGNOSIS — Z419 Encounter for procedure for purposes other than remedying health state, unspecified: Secondary | ICD-10-CM | POA: Diagnosis not present

## 2023-05-11 DIAGNOSIS — F331 Major depressive disorder, recurrent, moderate: Secondary | ICD-10-CM | POA: Diagnosis not present

## 2023-05-23 DIAGNOSIS — F330 Major depressive disorder, recurrent, mild: Secondary | ICD-10-CM | POA: Diagnosis not present

## 2023-05-30 DIAGNOSIS — F339 Major depressive disorder, recurrent, unspecified: Secondary | ICD-10-CM | POA: Diagnosis not present

## 2023-06-05 DIAGNOSIS — F411 Generalized anxiety disorder: Secondary | ICD-10-CM | POA: Diagnosis not present

## 2023-06-09 DIAGNOSIS — Z419 Encounter for procedure for purposes other than remedying health state, unspecified: Secondary | ICD-10-CM | POA: Diagnosis not present

## 2023-06-12 DIAGNOSIS — F330 Major depressive disorder, recurrent, mild: Secondary | ICD-10-CM | POA: Diagnosis not present

## 2023-06-19 DIAGNOSIS — F411 Generalized anxiety disorder: Secondary | ICD-10-CM | POA: Diagnosis not present

## 2023-06-26 DIAGNOSIS — F330 Major depressive disorder, recurrent, mild: Secondary | ICD-10-CM | POA: Diagnosis not present

## 2023-07-03 DIAGNOSIS — F411 Generalized anxiety disorder: Secondary | ICD-10-CM | POA: Diagnosis not present

## 2023-07-07 DIAGNOSIS — Z419 Encounter for procedure for purposes other than remedying health state, unspecified: Secondary | ICD-10-CM | POA: Diagnosis not present

## 2023-07-08 DIAGNOSIS — F411 Generalized anxiety disorder: Secondary | ICD-10-CM | POA: Diagnosis not present

## 2023-07-15 DIAGNOSIS — F411 Generalized anxiety disorder: Secondary | ICD-10-CM | POA: Diagnosis not present

## 2023-07-22 DIAGNOSIS — F411 Generalized anxiety disorder: Secondary | ICD-10-CM | POA: Diagnosis not present

## 2023-07-29 DIAGNOSIS — F411 Generalized anxiety disorder: Secondary | ICD-10-CM | POA: Diagnosis not present

## 2023-08-05 DIAGNOSIS — F411 Generalized anxiety disorder: Secondary | ICD-10-CM | POA: Diagnosis not present

## 2023-08-11 ENCOUNTER — Emergency Department (HOSPITAL_BASED_OUTPATIENT_CLINIC_OR_DEPARTMENT_OTHER)
Admission: EM | Admit: 2023-08-11 | Discharge: 2023-08-11 | Disposition: A | Discharge status: Home / Self Care | Attending: Emergency Medicine | Admitting: Emergency Medicine

## 2023-08-11 ENCOUNTER — Encounter (HOSPITAL_BASED_OUTPATIENT_CLINIC_OR_DEPARTMENT_OTHER): Payer: Self-pay | Admitting: Emergency Medicine

## 2023-08-11 ENCOUNTER — Other Ambulatory Visit: Payer: Self-pay

## 2023-08-11 DIAGNOSIS — J069 Acute upper respiratory infection, unspecified: Secondary | ICD-10-CM | POA: Insufficient documentation

## 2023-08-11 DIAGNOSIS — R059 Cough, unspecified: Secondary | ICD-10-CM | POA: Diagnosis present

## 2023-08-11 LAB — RESP PANEL BY RT-PCR (RSV, FLU A&B, COVID)  RVPGX2
Influenza A by PCR: NEGATIVE
Influenza B by PCR: NEGATIVE
Resp Syncytial Virus by PCR: NEGATIVE
SARS Coronavirus 2 by RT PCR: NEGATIVE

## 2023-08-11 LAB — GROUP A STREP BY PCR: Group A Strep by PCR: NOT DETECTED

## 2023-08-11 MED ORDER — BENZONATATE 200 MG PO CAPS: 200.0000 mg | ORAL_CAPSULE | Freq: Three times a day (TID) | ORAL | 0 refills | Status: AC | PRN

## 2023-08-11 MED ORDER — PREDNISONE 20 MG PO TABS: 40.0000 mg | ORAL_TABLET | Freq: Every day | ORAL | 0 refills | Status: AC

## 2023-08-11 NOTE — ED Triage Notes (Signed)
 Sore throat, sneezing, cough several days. Low grade fever at home. Afebrile in triage. Eating and drinking well.

## 2023-08-11 NOTE — ED Provider Notes (Signed)
 [place] EMERGENCY DEPARTMENT AT [facility] Provider Note   CSN: [id number] Arrival date & time: 08/11/23  0009     History  Chief Complaint  Patient presents with   Sore Throat    Shelly Andrews is a 43 y.o. female.  Presents to the emergency department for evaluation of URI symptoms.  Patient has been sick for several days.  She reports bilateral ear pain, nasal congestion, sore throat and cough.  She has been experiencing a low-grade fever.  She missed work a couple of days this week because of her illness.       Home Medications Prior to Admission medications   Medication Sig Start Date End Date Taking? Authorizing Provider  benzonatate (TESSALON) 200 MG capsule Take 1 capsule (200 mg total) by mouth 3 (three) times daily as needed for cough. 08/11/23  Yes Pegah Segel, Canary Brim, MD  predniSONE (DELTASONE) 20 MG tablet Take 2 tablets (40 mg total) by mouth daily with breakfast. 08/11/23  Yes Zilpha Mcandrew, Canary Brim, MD  acetaminophen (TYLENOL) 500 MG tablet Take 1,000 mg by mouth every 6 (six) hours as needed for mild pain or headache.     [provider]  Docosahexaenoic Acid (PRENATAL DHA) 200 MG CAPS Take 1 capsule by mouth daily.     [provider]  ibuprofen (ADVIL) 600 MG tablet Take 1 tablet (600 mg total) by mouth every 6 (six) hours. 10/10/22   Pollyann Savoy, MD  lidocaine (LIDODERM) 5 % Place 1 patch onto the skin daily. Remove & Discard patch within 12 hours or as directed by MD 10/10/22   Pollyann Savoy, MD  ondansetron (ZOFRAN-ODT) 4 MG disintegrating tablet Take 1 tablet (4 mg total) by mouth every 8 (eight) hours as needed for nausea or vomiting. 01/18/22   Waldon Merl, PA-C  pantoprazole (PROTONIX) 40 MG tablet Take 1 tablet (40 mg total) by mouth daily. 10/07/19   Judeth Horn, NP  triamcinolone cream (KENALOG) 0.1 % Apply 1 application topically 2 (two) times daily. 01/27/20   Maxwell Caul, PA-C      Allergies     Zoloft [sertraline hcl] and Sulfa antibiotics    Review of Systems   Review of Systems  Physical Exam Updated Vital Signs BP 102/68   Pulse 87   Temp 98.3 F (36.8 C) (Oral)   Resp 17   SpO2 98%  Physical Exam Vitals and nursing note reviewed.  Constitutional:      General: She is not in acute distress.    Appearance: She is well-developed.  HENT:     Head: Normocephalic and atraumatic.     Mouth/Throat:     Mouth: Mucous membranes are moist.  Eyes:     General: Vision grossly intact. Gaze aligned appropriately.     Extraocular Movements: Extraocular movements intact.     Conjunctiva/sclera: Conjunctivae normal.  Cardiovascular:     Rate and Rhythm: Normal rate and regular rhythm.     Pulses: Normal pulses.     Heart sounds: Normal heart sounds, S1 normal and S2 normal. No murmur heard.    No friction rub. No gallop.  Pulmonary:     Effort: Pulmonary effort is normal. No respiratory distress.     Breath sounds: Normal breath sounds.  Abdominal:     General: Bowel sounds are normal.     Palpations: Abdomen is soft.     Tenderness: There is no abdominal tenderness. There is no guarding or rebound.  Hernia: No hernia is present.  Musculoskeletal:        General: No swelling.     Cervical back: Full passive range of motion without pain, normal range of motion and neck supple. No spinous process tenderness or muscular tenderness. Normal range of motion.     Right lower leg: No edema.     Left lower leg: No edema.  Skin:    General: Skin is warm and dry.     Capillary Refill: Capillary refill takes less than 2 seconds.     Findings: No ecchymosis, erythema, rash or wound.  Neurological:     General: No focal deficit present.     Mental Status: She is alert and oriented to person, place, and time.     GCS: GCS eye subscore is 4. GCS verbal subscore is 5. GCS motor subscore is 6.     Cranial Nerves: Cranial nerves 2-12 are intact.     Sensory: Sensation is intact.      Motor: Motor function is intact.     Coordination: Coordination is intact.  Psychiatric:        Attention and Perception: Attention normal.        Mood and Affect: Mood normal.        Speech: Speech normal.        Behavior: Behavior normal.     ED Results / Procedures / Treatments   Labs (all labs ordered are listed, but only abnormal results are displayed) Labs Reviewed  RESP PANEL BY RT-PCR (RSV, FLU A&B, COVID)  [id number]  GROUP A STREP BY PCR    EKG None  Radiology No results found.  Procedures Procedures    Medications Ordered in ED Medications - No data to display  ED Course/ Medical Decision Making/ A&P                                 Medical Decision Making Risk Prescription drug management.   With mild URI symptoms.  Strep negative, flu, COVID-negative.  Treat symptomatically.        Final Clinical Impression(s) / ED Diagnoses Final diagnoses:  Upper respiratory tract infection, unspecified type    Rx / DC Orders ED Discharge Orders          Ordered    predniSONE (DELTASONE) 20 MG tablet  Daily with breakfast        08/11/23 0146    benzonatate (TESSALON) 200 MG capsule  3 times daily PRN        08/11/23 0146              Gilda Crease, MD 08/11/23 [phone]

## 2023-08-12 DIAGNOSIS — F411 Generalized anxiety disorder: Secondary | ICD-10-CM | POA: Diagnosis not present

## 2023-08-18 DIAGNOSIS — Z419 Encounter for procedure for purposes other than remedying health state, unspecified: Secondary | ICD-10-CM | POA: Diagnosis not present

## 2023-08-19 DIAGNOSIS — F411 Generalized anxiety disorder: Secondary | ICD-10-CM | POA: Diagnosis not present

## 2023-09-09 DIAGNOSIS — F411 Generalized anxiety disorder: Secondary | ICD-10-CM | POA: Diagnosis not present

## 2023-09-16 DIAGNOSIS — F411 Generalized anxiety disorder: Secondary | ICD-10-CM | POA: Diagnosis not present

## 2023-09-17 DIAGNOSIS — Z419 Encounter for procedure for purposes other than remedying health state, unspecified: Secondary | ICD-10-CM | POA: Diagnosis not present

## 2023-09-23 DIAGNOSIS — F411 Generalized anxiety disorder: Secondary | ICD-10-CM | POA: Diagnosis not present

## 2023-09-30 DIAGNOSIS — F411 Generalized anxiety disorder: Secondary | ICD-10-CM | POA: Diagnosis not present

## 2023-10-07 DIAGNOSIS — F411 Generalized anxiety disorder: Secondary | ICD-10-CM | POA: Diagnosis not present

## 2023-10-14 DIAGNOSIS — F411 Generalized anxiety disorder: Secondary | ICD-10-CM | POA: Diagnosis not present

## 2023-10-18 DIAGNOSIS — Z419 Encounter for procedure for purposes other than remedying health state, unspecified: Secondary | ICD-10-CM | POA: Diagnosis not present

## 2023-10-25 DIAGNOSIS — F411 Generalized anxiety disorder: Secondary | ICD-10-CM | POA: Diagnosis not present

## 2023-11-01 DIAGNOSIS — F411 Generalized anxiety disorder: Secondary | ICD-10-CM | POA: Diagnosis not present

## 2023-11-06 DIAGNOSIS — Z130 Encounter for screening for diseases of the blood and blood-forming organs and certain disorders involving the immune mechanism: Secondary | ICD-10-CM | POA: Diagnosis not present

## 2023-11-06 DIAGNOSIS — Z124 Encounter for screening for malignant neoplasm of cervix: Secondary | ICD-10-CM | POA: Diagnosis not present

## 2023-11-06 DIAGNOSIS — Z1151 Encounter for screening for human papillomavirus (HPV): Secondary | ICD-10-CM | POA: Diagnosis not present

## 2023-11-06 DIAGNOSIS — Z113 Encounter for screening for infections with a predominantly sexual mode of transmission: Secondary | ICD-10-CM | POA: Diagnosis not present

## 2023-11-06 DIAGNOSIS — Z0000 Encounter for general adult medical examination without abnormal findings: Secondary | ICD-10-CM | POA: Diagnosis not present

## 2023-11-06 DIAGNOSIS — Z6825 Body mass index (BMI) 25.0-25.9, adult: Secondary | ICD-10-CM | POA: Diagnosis not present

## 2023-11-08 DIAGNOSIS — F411 Generalized anxiety disorder: Secondary | ICD-10-CM | POA: Diagnosis not present

## 2023-11-15 DIAGNOSIS — F411 Generalized anxiety disorder: Secondary | ICD-10-CM | POA: Diagnosis not present

## 2023-11-17 DIAGNOSIS — Z419 Encounter for procedure for purposes other than remedying health state, unspecified: Secondary | ICD-10-CM | POA: Diagnosis not present

## 2023-11-20 DIAGNOSIS — R300 Dysuria: Secondary | ICD-10-CM | POA: Diagnosis not present

## 2023-11-20 DIAGNOSIS — N390 Urinary tract infection, site not specified: Secondary | ICD-10-CM | POA: Diagnosis not present

## 2023-11-22 DIAGNOSIS — F411 Generalized anxiety disorder: Secondary | ICD-10-CM | POA: Diagnosis not present

## 2023-11-29 DIAGNOSIS — F411 Generalized anxiety disorder: Secondary | ICD-10-CM | POA: Diagnosis not present

## 2023-12-02 DIAGNOSIS — F411 Generalized anxiety disorder: Secondary | ICD-10-CM | POA: Diagnosis not present

## 2023-12-04 DIAGNOSIS — F411 Generalized anxiety disorder: Secondary | ICD-10-CM | POA: Diagnosis not present

## 2023-12-09 DIAGNOSIS — F411 Generalized anxiety disorder: Secondary | ICD-10-CM | POA: Diagnosis not present

## 2023-12-11 DIAGNOSIS — F411 Generalized anxiety disorder: Secondary | ICD-10-CM | POA: Diagnosis not present

## 2023-12-16 DIAGNOSIS — F411 Generalized anxiety disorder: Secondary | ICD-10-CM | POA: Diagnosis not present

## 2023-12-18 DIAGNOSIS — Z419 Encounter for procedure for purposes other than remedying health state, unspecified: Secondary | ICD-10-CM | POA: Diagnosis not present

## 2023-12-18 DIAGNOSIS — F411 Generalized anxiety disorder: Secondary | ICD-10-CM | POA: Diagnosis not present

## 2023-12-23 DIAGNOSIS — F411 Generalized anxiety disorder: Secondary | ICD-10-CM | POA: Diagnosis not present

## 2023-12-25 DIAGNOSIS — F411 Generalized anxiety disorder: Secondary | ICD-10-CM | POA: Diagnosis not present

## 2023-12-30 DIAGNOSIS — F411 Generalized anxiety disorder: Secondary | ICD-10-CM | POA: Diagnosis not present

## 2024-01-01 DIAGNOSIS — F411 Generalized anxiety disorder: Secondary | ICD-10-CM | POA: Diagnosis not present

## 2024-01-06 DIAGNOSIS — F411 Generalized anxiety disorder: Secondary | ICD-10-CM | POA: Diagnosis not present

## 2024-01-08 DIAGNOSIS — F411 Generalized anxiety disorder: Secondary | ICD-10-CM | POA: Diagnosis not present

## 2024-01-13 DIAGNOSIS — F411 Generalized anxiety disorder: Secondary | ICD-10-CM | POA: Diagnosis not present

## 2024-01-15 DIAGNOSIS — F411 Generalized anxiety disorder: Secondary | ICD-10-CM | POA: Diagnosis not present

## 2024-01-18 DIAGNOSIS — Z419 Encounter for procedure for purposes other than remedying health state, unspecified: Secondary | ICD-10-CM | POA: Diagnosis not present

## 2024-01-20 DIAGNOSIS — F411 Generalized anxiety disorder: Secondary | ICD-10-CM | POA: Diagnosis not present

## 2024-01-22 DIAGNOSIS — F411 Generalized anxiety disorder: Secondary | ICD-10-CM | POA: Diagnosis not present

## 2024-01-27 DIAGNOSIS — F411 Generalized anxiety disorder: Secondary | ICD-10-CM | POA: Diagnosis not present

## 2024-01-29 DIAGNOSIS — F411 Generalized anxiety disorder: Secondary | ICD-10-CM | POA: Diagnosis not present

## 2024-02-03 DIAGNOSIS — F411 Generalized anxiety disorder: Secondary | ICD-10-CM | POA: Diagnosis not present

## 2024-02-05 DIAGNOSIS — F411 Generalized anxiety disorder: Secondary | ICD-10-CM | POA: Diagnosis not present

## 2024-02-10 DIAGNOSIS — F411 Generalized anxiety disorder: Secondary | ICD-10-CM | POA: Diagnosis not present

## 2024-02-12 DIAGNOSIS — F411 Generalized anxiety disorder: Secondary | ICD-10-CM | POA: Diagnosis not present

## 2024-02-17 DIAGNOSIS — F411 Generalized anxiety disorder: Secondary | ICD-10-CM | POA: Diagnosis not present

## 2024-02-24 DIAGNOSIS — F411 Generalized anxiety disorder: Secondary | ICD-10-CM | POA: Diagnosis not present

## 2024-03-02 DIAGNOSIS — F411 Generalized anxiety disorder: Secondary | ICD-10-CM | POA: Diagnosis not present

## 2024-03-04 DIAGNOSIS — R21 Rash and other nonspecific skin eruption: Secondary | ICD-10-CM | POA: Diagnosis not present

## 2024-03-09 DIAGNOSIS — F411 Generalized anxiety disorder: Secondary | ICD-10-CM | POA: Diagnosis not present

## 2024-03-12 DIAGNOSIS — J029 Acute pharyngitis, unspecified: Secondary | ICD-10-CM | POA: Diagnosis not present

## 2024-03-12 DIAGNOSIS — Z20822 Contact with and (suspected) exposure to covid-19: Secondary | ICD-10-CM | POA: Diagnosis not present

## 2024-03-12 DIAGNOSIS — B349 Viral infection, unspecified: Secondary | ICD-10-CM | POA: Diagnosis not present

## 2024-03-16 DIAGNOSIS — F411 Generalized anxiety disorder: Secondary | ICD-10-CM | POA: Diagnosis not present

## 2024-03-23 DIAGNOSIS — F411 Generalized anxiety disorder: Secondary | ICD-10-CM | POA: Diagnosis not present

## 2024-03-30 DIAGNOSIS — F411 Generalized anxiety disorder: Secondary | ICD-10-CM | POA: Diagnosis not present

## 2024-04-06 DIAGNOSIS — F411 Generalized anxiety disorder: Secondary | ICD-10-CM | POA: Diagnosis not present

## 2024-04-13 DIAGNOSIS — F411 Generalized anxiety disorder: Secondary | ICD-10-CM | POA: Diagnosis not present

## 2024-04-20 DIAGNOSIS — F411 Generalized anxiety disorder: Secondary | ICD-10-CM | POA: Diagnosis not present

## 2024-04-27 DIAGNOSIS — F411 Generalized anxiety disorder: Secondary | ICD-10-CM | POA: Diagnosis not present

## 2024-04-29 ENCOUNTER — Encounter (HOSPITAL_BASED_OUTPATIENT_CLINIC_OR_DEPARTMENT_OTHER): Admitting: Certified Nurse Midwife

## 2024-05-13 ENCOUNTER — Encounter: Payer: Self-pay | Admitting: Obstetrics and Gynecology

## 2024-06-10 ENCOUNTER — Encounter: Payer: Self-pay | Admitting: Obstetrics and Gynecology

## 2024-07-15 ENCOUNTER — Encounter: Admitting: Family Medicine
